# Patient Record
Sex: Male | Born: 1955 | Race: White | Hispanic: Yes | Marital: Married | Smoking: Never smoker
Health system: Southern US, Community
[De-identification: ages and names within clinical notes are randomized; demographics above are authoritative.]

## PROBLEM LIST (undated history)

## (undated) DIAGNOSIS — E785 Hyperlipidemia, unspecified: Secondary | ICD-10-CM

## (undated) DIAGNOSIS — M545 Low back pain, unspecified: Secondary | ICD-10-CM

## (undated) DIAGNOSIS — R0602 Shortness of breath: Secondary | ICD-10-CM

## (undated) DIAGNOSIS — G8929 Other chronic pain: Secondary | ICD-10-CM

## (undated) DIAGNOSIS — K219 Gastro-esophageal reflux disease without esophagitis: Secondary | ICD-10-CM

## (undated) DIAGNOSIS — I639 Cerebral infarction, unspecified: Secondary | ICD-10-CM

## (undated) DIAGNOSIS — I10 Essential (primary) hypertension: Secondary | ICD-10-CM

## (undated) DIAGNOSIS — E7849 Other hyperlipidemia: Secondary | ICD-10-CM

## (undated) HISTORY — DX: Hyperlipidemia, unspecified: E78.5

## (undated) HISTORY — DX: Other hyperlipidemia: E78.49

## (undated) HISTORY — DX: Essential (primary) hypertension: I10

## (undated) HISTORY — PX: KNEE ARTHROSCOPY: SHX127

---

## 2000-06-12 ENCOUNTER — Encounter: Payer: Self-pay | Admitting: Family Medicine

## 2000-06-12 ENCOUNTER — Encounter: Admission: RE | Admit: 2000-06-12 | Discharge: 2000-06-12 | Payer: Self-pay | Admitting: Family Medicine

## 2002-10-02 ENCOUNTER — Encounter: Payer: Self-pay | Admitting: Emergency Medicine

## 2002-10-02 ENCOUNTER — Emergency Department (HOSPITAL_COMMUNITY): Admission: EM | Admit: 2002-10-02 | Discharge: 2002-10-02 | Payer: Self-pay | Admitting: Emergency Medicine

## 2003-08-09 ENCOUNTER — Encounter: Admission: RE | Admit: 2003-08-09 | Discharge: 2003-08-09 | Payer: Self-pay | Admitting: Family Medicine

## 2003-09-22 ENCOUNTER — Encounter: Admission: RE | Admit: 2003-09-22 | Discharge: 2003-09-22 | Payer: Self-pay | Admitting: Family Medicine

## 2004-06-20 ENCOUNTER — Ambulatory Visit (HOSPITAL_COMMUNITY): Admission: RE | Admit: 2004-06-20 | Discharge: 2004-06-20 | Payer: Self-pay | Admitting: Gastroenterology

## 2004-09-15 ENCOUNTER — Inpatient Hospital Stay (HOSPITAL_COMMUNITY): Admission: EM | Admit: 2004-09-15 | Discharge: 2004-09-18 | Payer: Self-pay | Admitting: Emergency Medicine

## 2005-05-31 ENCOUNTER — Encounter: Admission: RE | Admit: 2005-05-31 | Discharge: 2005-08-29 | Payer: Self-pay | Admitting: Family Medicine

## 2012-03-16 ENCOUNTER — Other Ambulatory Visit: Payer: Self-pay | Admitting: Dermatology

## 2012-03-20 ENCOUNTER — Ambulatory Visit (INDEPENDENT_AMBULATORY_CARE_PROVIDER_SITE_OTHER): Payer: Managed Care, Other (non HMO) | Admitting: Family

## 2012-03-20 ENCOUNTER — Encounter: Payer: Self-pay | Admitting: Family

## 2012-03-20 ENCOUNTER — Telehealth: Payer: Self-pay

## 2012-03-20 ENCOUNTER — Ambulatory Visit (INDEPENDENT_AMBULATORY_CARE_PROVIDER_SITE_OTHER)
Admission: RE | Admit: 2012-03-20 | Discharge: 2012-03-20 | Disposition: A | Discharge status: Home / Self Care | Payer: Managed Care, Other (non HMO) | Source: Ambulatory Visit | Attending: Family | Admitting: Family

## 2012-03-20 VITALS — BP 122/82 | HR 87 | Temp 98.3°F | Ht 70.0 in | Wt 236.0 lb

## 2012-03-20 DIAGNOSIS — R079 Chest pain, unspecified: Secondary | ICD-10-CM

## 2012-03-20 DIAGNOSIS — K219 Gastro-esophageal reflux disease without esophagitis: Secondary | ICD-10-CM

## 2012-03-20 LAB — CBC WITH DIFFERENTIAL/PLATELET
Basophils Absolute: 0 10*3/uL (ref 0.0–0.1)
Basophils Relative: 0.3 % (ref 0.0–3.0)
Eosinophils Absolute: 0.4 10*3/uL (ref 0.0–0.7)
Eosinophils Relative: 5.6 % — ABNORMAL HIGH (ref 0.0–5.0)
HCT: 47.3 % (ref 39.0–52.0)
MCHC: 33.2 g/dL (ref 30.0–36.0)
MCV: 94 fl (ref 78.0–100.0)
Monocytes Relative: 10.2 % (ref 3.0–12.0)
Neutrophils Relative %: 48.1 % (ref 43.0–77.0)
Platelets: 221 10*3/uL (ref 150.0–400.0)
RBC: 5.03 Mil/uL (ref 4.22–5.81)
RDW: 13.8 % (ref 11.5–14.6)

## 2012-03-20 LAB — COMPREHENSIVE METABOLIC PANEL
Calcium: 9.2 mg/dL (ref 8.4–10.5)
Chloride: 102 mEq/L (ref 96–112)
Sodium: 138 mEq/L (ref 135–145)

## 2012-03-20 LAB — LIPID PANEL
Cholesterol: 165 mg/dL (ref 0–200)
Total CHOL/HDL Ratio: 3
Triglycerides: 75 mg/dL (ref 0.0–149.0)
VLDL: 15 mg/dL (ref 0.0–40.0)

## 2012-03-20 LAB — TSH: TSH: 1.21 u[IU]/mL (ref 0.35–5.50)

## 2012-03-20 MED ORDER — OMEPRAZOLE 40 MG PO CPDR: 40.0000 mg | DELAYED_RELEASE_CAPSULE | Freq: Every day | ORAL | Status: DC

## 2012-03-20 NOTE — Progress Notes (Signed)
Subjective:    Patient ID: Timothy Case, male    DOB: [birth year], 56 y.o.   MRN: [id number]  HPI 56 year old male, new patient to the practice is in to be established. He has a history of hyperlipidemia and is currently taking Lipitor. Patient has concerns today of pain in his left chest x1 week, described as mild, 1-2/10. Pain is worse with coughing or sneezing. Is not affected by movement. He's also noticed an increase in belching, burping, bloating and fullness. His wife gave him Nexium last night to have made much of a difference. Consumes about 4 cups of caffeine a day. 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 Not currently exercising. He eats out daily, but avoids fast food. Family history with his father is unknown. Mother died at age 25 unknown illness.    Review of Systems  Constitutional: Negative.   HENT: Negative.   Respiratory: Negative.  Negative for chest tightness.   Cardiovascular: Positive for chest pain. Negative for palpitations and leg swelling.  Gastrointestinal: Negative.   Genitourinary: Negative.   Musculoskeletal: Negative.   Skin: Negative.   Neurological: Negative.   Hematological: Negative.   Psychiatric/Behavioral: Negative.    Past Medical History  Diagnosis Date  . Hyperlipidemia   . Hypertension     History   Social History  . Marital Status: Married    Spouse Name: N/A    Number of Children: N/A  . Years of Education: N/A   Occupational History  . Not on file.   Social History Main Topics  . Smoking status: Never Smoker   . Smokeless tobacco: Not on file  . Alcohol Use: Yes     Comment: casual  . Drug Use: No  . Sexually Active: Not on file   Other Topics Concern  . Not on file   Social History Narrative  . No narrative on file    Past Surgical History  Procedure Date  . Knee surgery     No family history on file.  No Known Allergies  Current Outpatient  Prescriptions on File Prior to Visit  Medication Sig Dispense Refill  . atorvastatin (LIPITOR) 20 MG tablet Take 20 mg by mouth daily.       Marland Kitchen NASONEX 50 MCG/ACT nasal spray Place 2 sprays into the nose daily.       . ramipril (ALTACE) 5 MG capsule Take 5 mg by mouth daily.       Marland Kitchen zolpidem (AMBIEN) 10 MG tablet Take 10 mg by mouth at bedtime as needed.         BP 122/82  Pulse 87  Temp 98.3 F (36.8 C) (Oral)  Ht 5\' 10"  (1.778 m)  Wt 236 lb (107.049 kg)  BMI 33.86 kg/m2  SpO2 93%chart    Objective:   Physical Exam  Constitutional: He is oriented to person, place, and time. He appears well-developed and well-nourished.  HENT:  Right Ear: External ear normal.  Left Ear: External ear normal.  Nose: Nose normal.  Mouth/Throat: Oropharynx is clear and moist.  Eyes: Pupils are equal, round, and reactive to light.  Neck: Normal range of motion. Neck supple. No thyromegaly present.  Cardiovascular: Normal rate, regular rhythm and normal heart sounds.   Pulmonary/Chest: Effort normal and breath sounds normal.  Abdominal: Soft. Bowel sounds are normal.  Musculoskeletal: Normal range of motion.  Neurological: He is alert and oriented to person, place, and time. He has normal reflexes.  Skin: Skin is warm and dry.  Psychiatric: He has a normal mood and affect.          Assessment & Plan:  Assessment: Chest pain, GERD  Plan: Obtain chest x-ray, CMP, lipids, LFTs, CBC will notify patient pending results. We may consider referral to cardiology just for further clearance if his symptoms do not improve.

## 2012-03-20 NOTE — Telephone Encounter (Signed)
Message copied by Beverely Low on Fri Mar 20, 2012  3:00 PM ------      Message from: Adline Mango B      Created: Fri Mar 20, 2012 12:37 PM       Labs normal

## 2012-03-20 NOTE — Patient Instructions (Signed)
Chest Pain (Nonspecific) It is often hard to give a specific diagnosis for the cause of chest pain. There is always a chance that your pain could be related to something serious, such as a heart attack or a blood clot in the lungs. You need to follow up with your caregiver for further evaluation. CAUSES   Heartburn.  Pneumonia or bronchitis.  Anxiety or stress.  Inflammation around your heart (pericarditis) or lung (pleuritis or pleurisy).  A blood clot in the lung.  A collapsed lung (pneumothorax). It can develop suddenly on its own (spontaneous pneumothorax) or from injury (trauma) to the chest.  Shingles infection (herpes zoster virus). The chest wall is composed of bones, muscles, and cartilage. Any of these can be the source of the pain.  The bones can be bruised by injury.  The muscles or cartilage can be strained by coughing or overwork.  The cartilage can be affected by inflammation and become sore (costochondritis). DIAGNOSIS  Lab tests or other studies, such as X-rays, electrocardiography, stress testing, or cardiac imaging, may be needed to find the cause of your pain.  TREATMENT   Treatment depends on what may be causing your chest pain. Treatment may include:  Acid blockers for heartburn.  Anti-inflammatory medicine.  Pain medicine for inflammatory conditions.  Antibiotics if an infection is present.  You may be advised to change lifestyle habits. This includes stopping smoking and avoiding alcohol, caffeine, and chocolate.  You may be advised to keep your head raised (elevated) when sleeping. This reduces the chance of acid going backward from your stomach into your esophagus.  Most of the time, nonspecific chest pain will improve within 2 to 3 days with rest and mild pain medicine. HOME CARE INSTRUCTIONS   If antibiotics were prescribed, take your antibiotics as directed. Finish them even if you start to feel better.  For the next few days, avoid physical  activities that bring on chest pain. Continue physical activities as directed.  Do not smoke.  Avoid drinking alcohol.  Only take over-the-counter or prescription medicine for pain, discomfort, or fever as directed by your caregiver.  Follow your caregiver's suggestions for further testing if your chest pain does not go away.  Keep any follow-up appointments you made. If you do not go to an appointment, you could develop lasting (chronic) problems with pain. If there is any problem keeping an appointment, you must call to reschedule. SEEK MEDICAL CARE IF:   You think you are having problems from the medicine you are taking. Read your medicine instructions carefully.  Your chest pain does not go away, even after treatment.  You develop a rash with blisters on your chest. SEEK IMMEDIATE MEDICAL CARE IF:   You have increased chest pain or pain that spreads to your arm, neck, jaw, back, or abdomen.  You develop shortness of breath, an increasing cough, or you are coughing up blood.  You have severe back or abdominal pain, feel nauseous, or vomit.  You develop severe weakness, fainting, or chills.  You have a fever. THIS IS AN EMERGENCY. Do not wait to see if the pain will go away. Get medical help at once. Call your local emergency services (911 in [place] Do not drive yourself to the hospital. MAKE SURE YOU:   Understand these instructions.  Will watch your condition.  Will get help right away if you are not doing well or get worse. Document Released: 01/09/2005 Document Revised: 06/24/2011 Document Reviewed: 11/05/2007 ExitCare Patient Information 2013 ExitCare,   LLC.   Diet for Gastroesophageal Reflux Disease, Adult Reflux (acid reflux) is when acid from your stomach flows up into the esophagus. When acid comes in contact with the esophagus, the acid causes irritation and soreness (inflammation) in the esophagus. When reflux happens often or so severely that it causes damage  to the esophagus, it is called gastroesophageal reflux disease (GERD). Nutrition therapy can help ease the discomfort of GERD. FOODS OR DRINKS TO AVOID OR LIMIT  Smoking or chewing tobacco. Nicotine is one of the most potent stimulants to acid production in the gastrointestinal tract.  Caffeinated and decaffeinated coffee and black tea.  Regular or low-calorie carbonated beverages or energy drinks (caffeine-free carbonated beverages are allowed).   Strong spices, such as black pepper, white pepper, red pepper, cayenne, curry powder, and chili powder.  Peppermint or spearmint.  Chocolate.  High-fat foods, including meats and fried foods. Extra added fats including oils, butter, salad dressings, and nuts. Limit these to less than 8 tsp per day.  Fruits and vegetables if they are not tolerated, such as citrus fruits or tomatoes.  Alcohol.  Any food that seems to aggravate your condition. If you have questions regarding your diet, call your caregiver or a registered dietitian. OTHER THINGS THAT MAY HELP GERD INCLUDE:   Eating your meals slowly, in a relaxed setting.  Eating 5 to 6 small meals per day instead of 3 large meals.  Eliminating food for a period of time if it causes distress.  Not lying down until 3 hours after eating a meal.  Keeping the head of your bed raised 6 to 9 inches (15 to 23 cm) by using a foam wedge or blocks under the legs of the bed. Lying flat may make symptoms worse.  Being physically active. Weight loss may be helpful in reducing reflux in overweight or obese adults.  Wear loose fitting clothing EXAMPLE MEAL PLAN This meal plan is approximately 2,000 calories based on https://www.bernard.org/ meal planning guidelines. Breakfast   cup cooked oatmeal.  1 cup strawberries.  1 cup low-fat milk.  1 oz almonds. Snack  1 cup cucumber slices.  6 oz yogurt (made from low-fat or fat-free milk). Lunch  2 slice whole-wheat bread.  2 oz sliced  [place].  2 tsp mayonnaise.  1 cup blueberries.  1 cup snap peas. Snack  6 whole-wheat crackers.  1 oz string cheese. Dinner   cup brown rice.  1 cup mixed veggies.  1 tsp olive oil.  3 oz grilled fish. Document Released: 04/01/2005 Document Revised: 06/24/2011 Document Reviewed: 02/15/2011 [facility] Patient Information 2013 [place], [place].

## 2012-03-23 DIAGNOSIS — K219 Gastro-esophageal reflux disease without esophagitis: Secondary | ICD-10-CM

## 2012-03-23 DIAGNOSIS — R079 Chest pain, unspecified: Secondary | ICD-10-CM

## 2012-03-26 ENCOUNTER — Ambulatory Visit: Payer: Self-pay | Admitting: Physician Assistant

## 2012-04-21 ENCOUNTER — Emergency Department (HOSPITAL_COMMUNITY): Payer: Managed Care, Other (non HMO)

## 2012-04-21 ENCOUNTER — Encounter (HOSPITAL_COMMUNITY): Payer: Self-pay | Admitting: Emergency Medicine

## 2012-04-21 ENCOUNTER — Telehealth: Payer: Self-pay | Admitting: Family

## 2012-04-21 ENCOUNTER — Observation Stay (HOSPITAL_COMMUNITY)
Admission: EM | Admit: 2012-04-21 | Discharge: 2012-04-22 | Disposition: A | Discharge status: Home / Self Care | Payer: Managed Care, Other (non HMO) | Attending: Family Medicine | Admitting: Family Medicine

## 2012-04-21 ENCOUNTER — Other Ambulatory Visit: Payer: Self-pay

## 2012-04-21 DIAGNOSIS — G459 Transient cerebral ischemic attack, unspecified: Secondary | ICD-10-CM

## 2012-04-21 DIAGNOSIS — Z7902 Long term (current) use of antithrombotics/antiplatelets: Secondary | ICD-10-CM | POA: Insufficient documentation

## 2012-04-21 DIAGNOSIS — K219 Gastro-esophageal reflux disease without esophagitis: Secondary | ICD-10-CM | POA: Diagnosis present

## 2012-04-21 DIAGNOSIS — I6350 Cerebral infarction due to unspecified occlusion or stenosis of unspecified cerebral artery: Secondary | ICD-10-CM | POA: Insufficient documentation

## 2012-04-21 DIAGNOSIS — R0602 Shortness of breath: Secondary | ICD-10-CM | POA: Insufficient documentation

## 2012-04-21 DIAGNOSIS — R079 Chest pain, unspecified: Principal | ICD-10-CM

## 2012-04-21 DIAGNOSIS — E669 Obesity, unspecified: Secondary | ICD-10-CM

## 2012-04-21 DIAGNOSIS — G9340 Encephalopathy, unspecified: Secondary | ICD-10-CM

## 2012-04-21 DIAGNOSIS — E785 Hyperlipidemia, unspecified: Secondary | ICD-10-CM

## 2012-04-21 DIAGNOSIS — Z79899 Other long term (current) drug therapy: Secondary | ICD-10-CM | POA: Insufficient documentation

## 2012-04-21 DIAGNOSIS — Z7982 Long term (current) use of aspirin: Secondary | ICD-10-CM | POA: Insufficient documentation

## 2012-04-21 DIAGNOSIS — R410 Disorientation, unspecified: Secondary | ICD-10-CM

## 2012-04-21 DIAGNOSIS — I639 Cerebral infarction, unspecified: Secondary | ICD-10-CM

## 2012-04-21 DIAGNOSIS — R404 Transient alteration of awareness: Secondary | ICD-10-CM

## 2012-04-21 DIAGNOSIS — I10 Essential (primary) hypertension: Secondary | ICD-10-CM

## 2012-04-21 HISTORY — DX: Cerebral infarction, unspecified: I63.9

## 2012-04-21 HISTORY — DX: Gastro-esophageal reflux disease without esophagitis: K21.9

## 2012-04-21 HISTORY — DX: Low back pain, unspecified: M54.50

## 2012-04-21 HISTORY — DX: Shortness of breath: R06.02

## 2012-04-21 HISTORY — DX: Low back pain: M54.5

## 2012-04-21 HISTORY — DX: Other chronic pain: G89.29

## 2012-04-21 LAB — CBC
HCT: 45.7 % (ref 39.0–52.0)
Hemoglobin: 15.8 g/dL (ref 13.0–17.0)
MCH: 31.9 pg (ref 26.0–34.0)
MCHC: 34.6 g/dL (ref 30.0–36.0)
RBC: 4.96 MIL/uL (ref 4.22–5.81)

## 2012-04-21 LAB — URINALYSIS, ROUTINE W REFLEX MICROSCOPIC
Bilirubin Urine: NEGATIVE
Hgb urine dipstick: NEGATIVE
Ketones, ur: NEGATIVE mg/dL
Leukocytes, UA: NEGATIVE
Nitrite: NEGATIVE
Protein, ur: NEGATIVE mg/dL
Specific Gravity, Urine: 1.024 (ref 1.005–1.030)
Urobilinogen, UA: 0.2 mg/dL (ref 0.0–1.0)
pH: 7 (ref 5.0–8.0)

## 2012-04-21 LAB — CBC WITH DIFFERENTIAL/PLATELET
Basophils Relative: 0 % (ref 0–1)
Lymphs Abs: 2.3 10*3/uL (ref 0.7–4.0)
MCH: 31.5 pg (ref 26.0–34.0)
MCHC: 34.4 g/dL (ref 30.0–36.0)
MCV: 91.6 fL (ref 78.0–100.0)
Monocytes Absolute: 0.6 10*3/uL (ref 0.1–1.0)
Monocytes Relative: 9 % (ref 3–12)
Neutro Abs: 3.6 10*3/uL (ref 1.7–7.7)
Platelets: 221 10*3/uL (ref 150–400)
RDW: 13.6 % (ref 11.5–15.5)

## 2012-04-21 LAB — CREATININE, SERUM: GFR calc non Af Amer: >90 mL/min (ref >90)

## 2012-04-21 LAB — POCT I-STAT TROPONIN I: Troponin i, poc: 0 ng/mL (ref 0.00–0.08)

## 2012-04-21 LAB — BASIC METABOLIC PANEL
BUN: 21 mg/dL (ref 6–23)
Chloride: 100 mEq/L (ref 96–112)
Potassium: 4.4 mEq/L (ref 3.5–5.1)

## 2012-04-21 LAB — GLUCOSE, CAPILLARY: Glucose-Capillary: 120 mg/dL — ABNORMAL HIGH (ref 70–99)

## 2012-04-21 LAB — RAPID URINE DRUG SCREEN, HOSP PERFORMED
Amphetamines: NOT DETECTED
Opiates: NOT DETECTED

## 2012-04-21 MED ORDER — ACETAMINOPHEN 325 MG PO TABS
650.0000 mg | ORAL_TABLET | Freq: Four times a day (QID) | ORAL | Status: DC | PRN
  Administered 2012-04-21 – 2012-04-22 (×2): 650 mg via ORAL
  Filled 2012-04-21: qty 2

## 2012-04-21 MED ORDER — SODIUM CHLORIDE 0.9 % IJ SOLN
3.0000 mL | Freq: Two times a day (BID) | INTRAMUSCULAR | Status: DC
  Administered 2012-04-21 – 2012-04-22 (×2): 3 mL via INTRAVENOUS

## 2012-04-21 MED ORDER — SODIUM CHLORIDE 0.9 % IJ SOLN: 3.0000 mL | INTRAMUSCULAR | Status: DC | PRN

## 2012-04-21 MED ORDER — ASPIRIN 325 MG PO TABS
325.0000 mg | ORAL_TABLET | Freq: Once | ORAL | Status: AC
  Administered 2012-04-21: 325 mg via ORAL
  Filled 2012-04-21: qty 1

## 2012-04-21 MED ORDER — ONDANSETRON HCL 4 MG/2ML IJ SOLN
4.0000 mg | Freq: Once | INTRAMUSCULAR | Status: AC
Start: 2012-04-21 — End: 2012-04-21
  Administered 2012-04-21: 4 mg via INTRAVENOUS

## 2012-04-21 MED ORDER — ONDANSETRON HCL 4 MG/2ML IJ SOLN
INTRAMUSCULAR | Status: AC
  Administered 2012-04-21: 4 mg via INTRAVENOUS
  Filled 2012-04-21: qty 2

## 2012-04-21 MED ORDER — FLUTICASONE PROPIONATE 50 MCG/ACT NA SUSP
1.0000 | Freq: Every day | NASAL | Status: DC
  Administered 2012-04-21 – 2012-04-22 (×2): 1 via NASAL
  Filled 2012-04-21: qty 16

## 2012-04-21 MED ORDER — SODIUM CHLORIDE 0.9 % IV SOLN: 250.0000 mL | INTRAVENOUS | Status: DC | PRN

## 2012-04-21 MED ORDER — PANTOPRAZOLE SODIUM 40 MG PO TBEC
80.0000 mg | DELAYED_RELEASE_TABLET | Freq: Every day | ORAL | Status: DC
  Administered 2012-04-21 – 2012-04-22 (×2): 80 mg via ORAL
  Filled 2012-04-21 (×2): qty 1
  Filled 2012-04-21: qty 2

## 2012-04-21 MED ORDER — ENOXAPARIN SODIUM 40 MG/0.4ML ~~LOC~~ SOLN
40.0000 mg | SUBCUTANEOUS | Status: DC
  Administered 2012-04-21: 40 mg via SUBCUTANEOUS
  Filled 2012-04-21 (×2): qty 0.4

## 2012-04-21 MED ORDER — RAMIPRIL 5 MG PO CAPS
5.0000 mg | ORAL_CAPSULE | Freq: Every day | ORAL | Status: DC
  Administered 2012-04-21: 5 mg via ORAL
  Filled 2012-04-21 (×2): qty 1

## 2012-04-21 MED ORDER — ATORVASTATIN CALCIUM 20 MG PO TABS
20.0000 mg | ORAL_TABLET | Freq: Every day | ORAL | Status: DC
  Administered 2012-04-21 – 2012-04-22 (×2): 20 mg via ORAL
  Filled 2012-04-21 (×2): qty 1

## 2012-04-21 MED ORDER — ASPIRIN 325 MG PO TABS
325.0000 mg | ORAL_TABLET | Freq: Every day | ORAL | Status: DC
  Administered 2012-04-21 – 2012-04-22 (×2): 325 mg via ORAL
  Filled 2012-04-21 (×3): qty 1

## 2012-04-21 MED ORDER — ZOLPIDEM TARTRATE 5 MG PO TABS
10.0000 mg | ORAL_TABLET | Freq: Every evening | ORAL | Status: DC | PRN
  Administered 2012-04-21: 10 mg via ORAL
  Filled 2012-04-21: qty 2

## 2012-04-21 NOTE — ED Notes (Signed)
MD at bedside. 

## 2012-04-21 NOTE — ED Provider Notes (Signed)
History     CSN: [id number]  Arrival date & time 04/21/12  1478   First MD Initiated Contact with Patient 04/21/12 [phone]      Chief Complaint  Patient presents with  . Chest Pain  . Altered Mental Status     Patient is a 57 y.o. male presenting with altered mental status. The history is provided by the patient and the spouse. The history is limited by the condition of the patient.  Altered Mental Status This is a new problem. Episode onset: unknown time ago. The problem occurs constantly. The problem has not changed since onset.Associated symptoms include chest pain and shortness of breath. Pertinent negatives include no abdominal pain and no headaches. Nothing aggravates the symptoms. Nothing relieves the symptoms. He has tried nothing for the symptoms. The treatment provided no relief.  pt presents from home for confusion Per wife, he was last seen last night prior to going to bed watching football and at his baseline He woke up and reported shortness of breath this morning.  He also appeared confused - wife reports that he had repetitive questioning and was not aware of the date.  He also endorsed CP.  She noted to focal weakness.  No slurred speech.  He has no h/o CVA.  No h/o drug use.  He had otherwise been at his baseline   Past Medical History  Diagnosis Date  . Hyperlipidemia   . Hypertension     Past Surgical History  Procedure Date  . Knee surgery     History reviewed. No pertinent family history.  History  Substance Use Topics  . Smoking status: Never Smoker   . Smokeless tobacco: Not on file  . Alcohol Use: Yes     Comment: casual      Review of Systems  Unable to perform ROS: Mental status change  Respiratory: Positive for shortness of breath.   Cardiovascular: Positive for chest pain.  Gastrointestinal: Negative for abdominal pain.  Neurological: Negative for headaches.  Psychiatric/Behavioral: Positive for altered mental status.    Allergies    Review of patient's allergies indicates no known allergies.  Home Medications   Current Outpatient Rx  Name  Route  Sig  Dispense  Refill  . ASPIRIN 81 MG PO TABS   Oral   Take 81 mg by mouth daily.         . ATORVASTATIN CALCIUM 20 MG PO TABS   Oral   Take 20 mg by mouth daily.          Marland Kitchen NASONEX 50 MCG/ACT NA SUSP   Nasal   Place 2 sprays into the nose daily.          Marland Kitchen OMEPRAZOLE 40 MG PO CPDR   Oral   Take 1 capsule (40 mg total) by mouth daily.   30 capsule   3   . RAMIPRIL 5 MG PO CAPS   Oral   Take 5 mg by mouth daily.          Marland Kitchen ZOLPIDEM TARTRATE 10 MG PO TABS   Oral   Take 10 mg by mouth at bedtime as needed.           BP 142/87  Pulse 78  Temp 98.6 F (37 C) (Oral)  Resp 17  SpO2 97%  Physical Exam CONSTITUTIONAL: Well developed/well nourished HEAD AND FACE: Normocephalic/atraumatic EYES: EOMI/PERRL, no nystagmus ENMT: Mucous membranes moist NECK: supple no meningeal signs, no bruits SPINE:entire spine nontender CV: S1/S2 noted, no  murmurs/rubs/gallops noted LUNGS: Lungs are clear to auscultation bilaterally, no apparent distress ABDOMEN: soft, nontender, no rebound or guarding GU:no cva tenderness NEURO:Awake/alert, facies symmetric, no arm or leg drift is noted Cranial nerves 3/4/5/6/10/21/08/11/12 tested and intact No past pointing He is oriented to person, he can recognize his wife and recall his birthdate.  However he can not recall current date or exact location EXTREMITIES: pulses normal, full ROM SKIN: warm, color normal PSYCH: no abnormalities of mood noted   ED Course  Procedures    Labs Reviewed  BASIC METABOLIC PANEL  CBC WITH DIFFERENTIAL   tPA in stroke considered but not given due to:  Onset over 3-4.5hours  11:29 AM Pt with onset of confusion, but unclear time of onset.  He has no focal motor deficits.  His speech is clear but he is still confused.  His CT head is abnormal.  Will admit for further workup.   For his CP - he is in no distress.  EKG without ischemic findings and troponin negative.  I doubt dissection/PE at this time 12:11 PM D/w dr Joette Catching to admit to tele Pt stable, family updated  MDM  Nursing notes including past medical history and social history reviewed and considered in documentation Labs/vital reviewed and considered xrays reviewed and considered        Date: 04/21/2012  Rate: 83  Rhythm: normal sinus rhythm  QRS Axis: normal  Intervals: normal  ST/T Wave abnormalities: nonspecific ST changes  Conduction Disutrbances:none     Joya Gaskins, MD 04/21/12 1211

## 2012-04-21 NOTE — ED Notes (Signed)
Patient transported to CT 

## 2012-04-21 NOTE — Telephone Encounter (Signed)
Pt's wife states that pt was disoriented and asked the same questions over and over again and that he continued to complain about the pain in his chest. Advised wife to call 911 immediately to get pt to the hospital. She verbalized understanding.

## 2012-04-21 NOTE — ED Notes (Signed)
Pt was seen at [facility] PMD r/t CP 2 weeks ago and diagnosed with acid reflux.

## 2012-04-21 NOTE — ED Notes (Signed)
Pt stated he was nausea during phlebotomy stick. ED MD made aware and verbal order received

## 2012-04-21 NOTE — ED Notes (Addendum)
Pt here c/o left sided CP and SOB and woke up this am with repetitive questions and confusion; per wife pt normally works every day and is independent; pt alert to person and place only; pt with clear speech and mae; pt c/o SOB and CP only; pt LSN last night

## 2012-04-21 NOTE — H&P (Signed)
[facility] History and Physical  Timothy Case XWR:[id number] DOB: 1956/01/31 DOA: 04/21/2012  Referring physician: Maurine Case, ER physician PCP: Timothy Quiet, FNP  Specialists: None  Chief Complaint: Confusion  HPI: Timothy Case is a 57 y.o. male  Past medical history of mild obesity, hypertension and hyperlipidemia who was in his usual state of health and clear mentation last night. 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 When she would answer him, he would then repeat the same question 30 seconds later. He kept repeating questions over and over and appeared to be somewhat confused. He was driven by his wife to the emergency room.  In the emergency room, blood work, urine drug screen and vitals are unremarkable. Patient did not know his own birth date, the current date or other basic questions. He was not agitated. A CT scan of the head noted a questionableVery subtle focus of low attenuation posterior limb right internal capsule possibly representing chronic small vessel ischemic progressive change versus subacute lacunar infarct. Hospitalists were called for further evaluation and admission. After an hour or so in the emergency room, patient's mentation returned to normal. The time he arrived to the floor, his wife confirmed that he was back to his baseline. The patient self states he has no recollection of the events of this morning.   Review of Systems: I saw the patient on the floor, he was doing okay. He denied any headaches, vision changes, dysphasia, chest pain, palpitations, shortness of breath, wheeze, cough, abdominal pain, hematuria, dysuria, constipation, diarrhea, focal extremity numbness or weakness or pain. He has no complaints. His only noteworthy issue is in the last few weeks he's had some issues  of this pain right below his left breast sometimes catches. He was evaluated for this at his primary care doctor told it was GERD. Medication resolved it  Past Medical History  Diagnosis Date  . Hyperlipidemia   . Hypertension    Past Surgical History  Procedure Date  . Knee surgery    Social History:  reports that he has never smoked. He does not have any smokeless tobacco history on file. He reports that he drinks alcohol. He reports that he does not use illicit drugs. Patient lives at home with his wife. He is normally able to participate in most activities of daily living without assistance  No Known Allergies  Family history: Patient states as far as he knows, his serum is overall pretty healthy. His sons are both healthy. His family emigrated abruptly from [place] and his mom died of old age.  Prior to Admission medications   Medication Sig Start Date End Date Taking? Authorizing Provider  atorvastatin (LIPITOR) 20 MG tablet Take 20 mg by mouth daily.    Yes Historical Provider, MD  NASONEX 50 MCG/ACT nasal spray Place 2 sprays into the nose daily.     Historical Provider, MD  omeprazole (PRILOSEC) 40 MG capsule Take 40 mg by mouth daily.    Timothy Pierini, FNP  ramipril (ALTACE) 5 MG capsule Take 5 mg by mouth daily.     Historical Provider, MD  zolpidem (AMBIEN) 10 MG tablet Take 10 mg by mouth at bedtime as needed. For sleep 12/20/11   Historical Provider, MD   Physical Exam: Filed Vitals:   04/21/12 1230 04/21/12 1300 04/21/12 1406 04/21/12 1615  BP:  128/83 138/98  Pulse: 71 77 71 76  Temp:   97.8 F (36.6 C) 97.5 F (36.4 C)  TempSrc:   Oral Oral  Resp: 13 18 16 18   Height:   5\' 10"  (1.778 m)   Weight:   106.6 kg (235 lb 0.2 oz)   SpO2: 96% 96% 97% 98%     General:  Alert and oriented x3, no acute distress  Eyes: Sclera nonicteric, extraocular movements are intact  ENT: Normocephalic, atraumatic, mucous members are moist  Neck: Neck is slightly thick, no  JVD, no carotid bruits  Cardiovascular: Regular rate and rhythm, S1-S2  Respiratory: Clear to auscultation bilaterally  Abdomen: Soft, obese, nontender, positive bowel sounds  Skin: No skin breaks, tears or lesions  Musculoskeletal: No clubbing or cyanosis or pitting edema  Psychiatric: Patient is appropriate no evidence of psychoses  Neurologic: No focal deficits. Normal finger to nose testing. Cranial nerves II through XII intact. Negative for Babinski sign. Flexion, extension and grip are intact for upper and lower 5/5 and symmetric  Labs on Admission:  Basic Metabolic Panel:  Lab 04/21/12 9811  NA 134*  K 4.4  CL 100  CO2 22  GLUCOSE 116*  BUN 21  CREATININE 0.81  CALCIUM 8.9  MG --  PHOS --   CBC:  Lab 04/21/12 0941  WBC 6.8  NEUTROABS 3.6  HGB 15.7  HCT 45.7  MCV 91.6  PLT 221   CBG:  Lab 04/21/12 0939  GLUCAP 120*    Radiological Exams on Admission: Ct Head Wo Contrast  04/21/2012   IMPRESSION: Very subtle focus of low attenuation posterior limb right internal capsule possibly representing chronic small vessel ischemic progressive change versus subacute lacunar infarct.  Otherwise negative study.   Original Report Authenticated By: Esperanza Heir, M.D.    Dg Chest Portable 1 View  04/21/2012   IMPRESSION:  1.  Mild aortic tortuosity.  No acute findings.   Original Report Authenticated By: Gaylyn Rong, M.D.     EKG: Independently reviewed. Normal sinus rhythm. (EKG reads as incomplete right bundle branch block, but this is inaccurate)  Assessment/Plan Principal Problem:  *Delirium: I have a high suspicion for TIA. Given signs of possible ischemia on CT plus no other pertinent positives, suspected he did indeed have a TIA which cause some delirium. We'll plan to check MRI/MRA, echocardiogram and carotid Dopplers. We'll also check A1 C. If workup is negative, would recommend he increase from 81 mg to 325 mg. In addition, he is currently on Lipitor 20  mg in the recommend he increase to 40 mg daily. If workup is complete, could likely go home tomorrow. Active Problems:  HTN (hypertension): Based on blood pressure records from [facility], blood pressure is well controlled  Obesity: Counseled on weight loss. Patient should probably lose about 45 pounds.  Wife also notes snoring. Patient has never had a sleep study and will recommend he get one as an outpatient  Hyperlipidemia: We will repeat fasting lipid profile as patient just had this done on 03/20/12. Studies note an LDL of 90 with an HDL of 60 and total cholesterol 165.  GERD (gastroesophageal reflux disease): Continue when necessary PPI    Code Status: Full code  Family Communication: Plan discussed with patient and his wife who is at the bedside.  Disposition Plan:  If workup is negative, discharge home tomorrow  Time spent: 30 minutes  Hollice Espy [facility] Pager [phone]  If 7PM-7AM, please contact night-coverage  [email or web address] Password [facility] 04/21/2012, 6:06 PM

## 2012-04-21 NOTE — Telephone Encounter (Signed)
Patient's spouse called stating that her husband is very confused not aware of his surroundings and acting strange. Triage was called and I was on hold for five minutes. Call was given to [facility] as triage was unreachable.

## 2012-04-22 ENCOUNTER — Observation Stay (HOSPITAL_COMMUNITY): Payer: Managed Care, Other (non HMO)

## 2012-04-22 DIAGNOSIS — G459 Transient cerebral ischemic attack, unspecified: Secondary | ICD-10-CM

## 2012-04-22 DIAGNOSIS — G9340 Encephalopathy, unspecified: Secondary | ICD-10-CM

## 2012-04-22 LAB — HEMOGLOBIN A1C: Mean Plasma Glucose: 123 mg/dL — ABNORMAL HIGH (ref <117)

## 2012-04-22 MED ORDER — CLOPIDOGREL BISULFATE 75 MG PO TABS: 75.0000 mg | ORAL_TABLET | Freq: Every day | ORAL | Status: DC

## 2012-04-22 MED ORDER — HYDROCODONE-ACETAMINOPHEN 5-325 MG PO TABS
1.0000 | ORAL_TABLET | Freq: Four times a day (QID) | ORAL | Status: DC | PRN
  Administered 2012-04-22: 1 via ORAL
  Filled 2012-04-22: qty 1

## 2012-04-22 NOTE — Progress Notes (Signed)
  Echocardiogram 2D Echocardiogram has been performed.  Ellender Hose A 04/22/2012, 1:42 PM

## 2012-04-22 NOTE — Progress Notes (Signed)
Occupational Therapy Note  OT order received and appreciated. Per PT report, pt has returned to baseline and has no acute OT needs. Will sign off at this time. Thanks.  04/22/2012 Cipriano Mile OTR/L Pager [phone] Office [phone]

## 2012-04-22 NOTE — Progress Notes (Signed)
*  PRELIMINARY RESULTS* Vascular Ultrasound Carotid Duplex (Doppler) has been completed.  Preliminary findings: Bilateral:  No evidence of hemodynamically significant internal carotid artery stenosis.   Vertebral artery flow is antegrade.      Farrel Demark, RDMS, RVT 04/22/2012, 1:16 PM

## 2012-04-22 NOTE — Discharge Summary (Signed)
Physician Discharge Summary  Timothy Case ZOX:[id number] DOB: 07/03/1955 DOA: 04/21/2012  PCP: Janell Quiet, FNP  Admit date: 04/21/2012 Discharge date: 04/22/2012  Recommendations for Outpatient Follow-up:  1. Followup suspected TIA, risk factor reduction  Follow-up Information    Follow up with CAMPBELL, PADONDA BOYD, FNP. In 2 weeks.   Contact information:   [address] are @ [place] [address] [phone]       Follow up with Gates Rigg, MD. Schedule an appointment as soon as possible for a visit in 2 months.   Contact information:   [address], [address] [facility] [place] [address] [phone]         Discharge Diagnoses:  1. Acute encephalopathy, delirium 2. Suspected TIA 3. Chest pain, shortness of breath 4. GERD 5. Hypertension 6. Hyperlipidemia 7. Obesity  Discharge Condition: Improved Disposition: Home  Diet recommendation: Heart healthy  Filed Weights   04/21/12 1406  Weight: 106.6 kg (235 lb 0.2 oz)    History of present illness:  57 year old man with history of hypertension and hyperlipidemia presented with confusion. Basic laboratory studies were unremarkable. Neurologic exam was reported to be nonfocal. EKG was unremarkable. CT head was concerning for stroke.  Hospital Course:  Mr. Gabay was admitted for further evaluation to exclude stroke. MRI was negative for stroke, MRA was unremarkable. Echocardiogram and carotid Dopplers were unremarkable. He had no recurrence of symptoms. Wife confirms that only symptoms were confusion. Urinalysis was negative and there is no history to suggest infection. The patient is amnestic raising the possibility of some kind of amnestic phenomena but at this point will treat presumptively for suspected TIA. Long discussion with him and his wife in regard to secondary risk factor reduction, weight loss, exercise and the importance of followup  with his primary care physician and with the neurologist. Admitted on aspirin discharged on Plavix. Continue Lipitor.  1. Acute encephalopathy, delirium: Resolved without recurrence. Suspected TIA. MRI brain and MRA head negative. Carotid ultrasound 2-D echocardiogram unremarkable Was on aspirin at admission. Discharge and Plavix. 2. Suspected TIA: As above 3. Atypical Chest pain, shortness of breath: Resolved, without recurrence. Patient recently evaluated in the outpatient setting for this and was treated with PPI which relieved symptoms. He feels that this is secondary to GERD, it is relieved with belching. Troponin negative. EKG unremarkable. No further evaluation suggested. Echocardiogram reassuring. Of note patient has also had extensive cardiac workup at [facility] where he has a yearly exam. 4. GERD: Continue PPI.  5. Hypertension: Stable. Resume ACE inhibitor on discharge.  6. Hyperlipidemia: Continue Lipitor. LDL of 90 with an HDL of 60 and total cholesterol 165  7. Obesity: Consider outpatient sleep study.  Consultants:  Speech therapy: No need for formal evaluation.   physical therapy: No needs  Occupational therapy: no needs  Procedures:  2-D echocardiogram: Left ventricle: The cavity size was normal. There was moderate focal basal hypertrophy of the septum. Systolic function was normal. Although no diagnostic regional wall motion abnormality was identified, this possibility cannot be completely excluded on the basis of this study. - Right ventricle: The cavity size was mildly dilated. Wall thickness was normal.  Carotid Dopplers: Preliminary findings: Bilateral: No evidence of hemodynamically significant internal carotid artery stenosis. Vertebral artery flow is antegrade.   Discharge Instructions  Discharge Orders    Future Orders Please Complete By Expires   Diet - low sodium heart healthy      Discharge instructions  Comments:   He was started on  a medication for presumed transient ischemic attack. This medication is called Plavix and take the place of aspirin. He should continue Lipitor. Followup with your primary care provider for continued management of hypertension, hyperlipidemia and risk factor reduction. Call your physician or seek immediate medical attention for confusion or signs of stroke.   Activity as tolerated - No restrictions          Medication List     As of 04/22/2012  5:42 PM    STOP taking these medications         aspirin 81 MG tablet      TAKE these medications         atorvastatin 20 MG tablet   Commonly known as: LIPITOR   Take 20 mg by mouth daily.      clopidogrel 75 MG tablet   Commonly known as: PLAVIX   Take 1 tablet (75 mg total) by mouth daily with breakfast.      NASONEX 50 MCG/ACT nasal spray   Generic drug: mometasone   Place 2 sprays into the nose daily.      omeprazole 40 MG capsule   Commonly known as: PRILOSEC   Take 40 mg by mouth daily.      ramipril 5 MG capsule   Commonly known as: ALTACE   Take 5 mg by mouth daily.      zolpidem 10 MG tablet   Commonly known as: AMBIEN   Take 10 mg by mouth at bedtime as needed. For sleep         The results of significant diagnostics from this hospitalization (including imaging, microbiology, ancillary and laboratory) are listed below for reference.    Significant Diagnostic Studies: Ct Head Wo Contrast  04/21/2012  *RADIOLOGY REPORT*  Clinical Data: Mental status changes including confusion, nausea  CT HEAD WITHOUT CONTRAST  Technique:  Contiguous axial images were obtained from the base of the skull through the vertex without contrast.  Comparison: 09/15/2004  Findings: There is mild age related atrophy.  There is no evidence of intracranial mass.  There is no hemorrhage or extra-axial fluid. There is no hydrocephalus.  There is a very subtle sub centimeter focus of low attenuation in the posterior limb of the internal capsule on the  right.  This may represent progressive mild chronic small vessel ischemic change or possibly a subacute lacunar infarct.  It is new from 2006.  IMPRESSION: Very subtle focus of low attenuation posterior limb right internal capsule possibly representing chronic small vessel ischemic progressive change versus subacute lacunar infarct.  Otherwise negative study.   Original Report Authenticated By: Esperanza Heir, M.D.    Mri Brain Without Contrast  04/22/2012  *RADIOLOGY REPORT*  Clinical Data:  Episode of confusion.  Hypertensive hyperlipidemic patient.  MRI BRAIN WITHOUT CONTRAST MRA HEAD WITHOUT CONTRAST  Technique: Multiplanar, multiecho pulse sequences of the brain and surrounding structures were obtained according to standard protocol without intravenous contrast.  Angiographic images of the head were obtained using MRA technique without contrast.  Comparison: 04/21/2012 CT.  No comparison MR.  MRI HEAD  Findings:  Motion degraded exam.  No acute infarct.  No intracranial hemorrhage.  No intracranial mass lesion detected on this unenhanced exam.  No hydrocephalus.  Cerebellar tonsils minimally low-lying but within the range of normal limits.  Minimal to mild paranasal sinus mucosal thickening.  IMPRESSION: No acute infarct.  Please see above.  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  Anterior circulation without medium or large size vessel significant stenosis or occlusion.  Fetal type origin right posterior cerebral artery.  Evaluation of the vertebral arteries, PICAs, AICAs and proximal vertebral artery is limited by motion artifact.  No high-grade stenosis of the distal vertebral arteries or proximal basilar artery.  Mild irregularity of the superior cerebellar artery bilaterally.  No obvious aneurysm or vascular malformation.  IMPRESSION: Exam is motion degraded limiting evaluation of the vertebral arteries, PICAs,  AICAs and proximal basilar artery.  Anterior circulation  without medium or large size vessel significant stenosis or occlusion.   Original Report Authenticated By: Lacy Duverney, M.D.    Dg Chest Portable 1 View  04/21/2012  *RADIOLOGY REPORT*  Clinical Data: Chest pain.  Altered mental status.  PORTABLE CHEST - 1 VIEW  Comparison: 03/20/2012  Findings: Heart size within normal limits for technique.  Mild tortuosity of the thoracic aorta is observed.  Mildly lordotic projection noted.  The lungs appear clear.  IMPRESSION:  1.  Mild aortic tortuosity.  No acute findings.   Original Report Authenticated By: Gaylyn Rong, M.D.         Labs: Basic Metabolic Panel:  Lab 04/21/12 1610 04/21/12 0941  NA -- 134*  K -- 4.4  CL -- 100  CO2 -- 22  GLUCOSE -- 116*  BUN -- 21  CREATININE 0.90 0.81  CALCIUM -- 8.9  MG -- --  PHOS -- --   CBC:  Lab 04/21/12 1822 04/21/12 0941  WBC 8.7 6.8  NEUTROABS -- 3.6  HGB 15.8 15.7  HCT 45.7 45.7  MCV 92.1 91.6  PLT 221 221   CBG:  Lab 04/21/12 0939  GLUCAP 120*    Principal Problem:  *Delirium Active Problems:  HTN (hypertension)  Obesity  Hyperlipidemia  GERD (gastroesophageal reflux disease)  Acute encephalopathy  TIA (transient ischemic attack)   Time coordinating discharge: 35 minutes  Signed:  Brendia Sacks, MD [facility] 04/22/2012, 5:42 PM

## 2012-04-22 NOTE — Progress Notes (Signed)
Off unit for scheduled tests via wheelchair in no acute distress

## 2012-04-22 NOTE — Evaluation (Addendum)
Physical Therapy Evaluation Patient Details Name: Timothy Case MRN: [id number] DOB: 1955-07-03 Today's Date: 04/22/2012 Time: 1478-2956 PT Time Calculation (min): 25 min  PT Assessment / Plan / Recommendation Clinical Impression  Pt is a 57 yo male with suspected TIA presenting with some short term memory deficits of day of incident but otherwise is functioning at baseline in safe and independent manner. Patient safe to d/c home with spouse.Patient with no further acute skilled PT needs. PT signing off on patient. Please reconsult if needed in future.    PT Assessment  Patent does not need any further PT services    Follow Up Recommendations  No PT follow up    Does the patient have the potential to tolerate intense rehabilitation      Barriers to Discharge        Equipment Recommendations       Recommendations for Other Services     Frequency      Precautions / Restrictions Precautions Precautions: None Restrictions Weight Bearing Restrictions: No   Pertinent Vitals/Pain Denies pain or numbness tingling       Mobility  Bed Mobility Bed Mobility: Supine to Sit;Sit to Supine Supine to Sit: 7: Independent;HOB flat Sit to Supine: 7: Independent;HOB flat Details for Bed Mobility Assistance: safe Transfers Transfers: Sit to Stand;Stand to Sit Sit to Stand: 7: Independent Stand to Sit: 7: Independent Details for Transfer Assistance: safe Ambulation/Gait Ambulation/Gait Assistance: 7: Independent Ambulation Distance (Feet): 250 Feet Assistive device: None Ambulation/Gait Assistance Details: WFL, no deviations or deficiits Gait Pattern: Within Functional Limits Gait velocity: wfl General Gait Details: normal Stairs: Yes Stairs Assistance: 6: Modified independent (Device/Increase time) Stair Management Technique: One rail Left Number of Stairs: 12  Modified Rankin (Stroke Patients Only) Pre-Morbid Rankin Score: No symptoms Modified Rankin: No symptoms      Shoulder Instructions     Exercises     PT Diagnosis:    PT Problem List:   PT Treatment Interventions:     PT Goals Acute Rehab PT Goals PT Goal Formulation:  (n/a)  Visit Information  Last PT Received On: 04/22/12 Assistance Needed: +1    Subjective Data  Subjective: Pt received supine in bed with report "I feel fine again." Patient Stated Goal: home   Prior Functioning  Home Living Lives With: Spouse Available Help at Discharge: Family;Available 24 hours/day Type of Home: House Home Access: Stairs to enter [employer] of Steps: 3 Entrance Stairs-Rails: Can reach both Home Layout: Multi-level;Able to live on main level with bedroom/bathroom Alternate Level Stairs-Number of Steps: 12 Alternate Level Stairs-Rails: Can reach both Bathroom Shower/Tub: Health visitor: Standard Prior Function Level of Independence: Independent Able to Take Stairs?: Yes Driving: Yes Vocation: Full time employment Comments: president of [employer] Communication Communication: No difficulties Dominant Hand: Right    Cognition  Overall Cognitive Status: Appears within functional limits for tasks assessed/performed Arousal/Alertness: awake/alert Orientation Level: Oriented X4 / Intact;Appears intact for tasks assessed. Pt with no memory of day of incident. Behavior During Session: [facility] for tasks performed    Extremity/Trunk Assessment Right Upper Extremity Assessment RUE ROM/Strength/Tone: Within functional levels RUE Sensation: WFL - Light Touch RUE Coordination: WFL - gross/fine motor Left Upper Extremity Assessment LUE ROM/Strength/Tone: Within functional levels LUE Sensation: WFL - Light Touch Right Lower Extremity Assessment RLE ROM/Strength/Tone: Within functional levels RLE Sensation: WFL - Light Touch RLE Coordination: WFL - gross/fine motor Left Lower Extremity Assessment LLE ROM/Strength/Tone: Within functional levels LLE Sensation: WFL - Light  Touch  LLE Coordination: WFL - gross/fine motor Trunk Assessment Trunk Assessment: Normal   Balance    End of Session PT - End of Session Activity Tolerance: Patient tolerated treatment well Patient left: in bed;with call bell/phone within reach;with family/visitor present Nurse Communication: Mobility status  GP Functional Assessment Tool Used: clinical judgment Functional Limitation: Mobility: Walking and moving around Mobility: Walking and Moving Around Current Status ([id number]): At least 1 percent but less than 20 percent impaired, limited or restricted Mobility: Walking and Moving Around Goal Status [phone]): 0 percent impaired, limited or restricted   Marcene Brawn 04/22/2012, 4:39 PM  Lewis Shock, PT, DPT Pager #: [phone] Office #: [phone]

## 2012-04-22 NOTE — Progress Notes (Addendum)
[facility] PROGRESS NOTE  Timothy Case RUE:[id number] DOB: 1955-09-19 DOA: 04/21/2012 PCP: Janell Quiet, FNP  Assessment/Plan: 1. Acute encephalopathy, delirium: Resolved without recurrence. Suspected TIA. MRI brain negative. Complete TIA evaluation. Consider increasing aspirin to 325 mg daily. 2. Chest pain, shortness of breath: Resolved, without recurrence. Patient recently evaluated in the outpatient setting for this and was treated with PPI which relieved symptoms. He feels that this is secondary to GERD, it is relieved with belching. Troponin negative. EKG unremarkable. No further evaluation suggested. 3. GERD: Continue PPI. 4. Hypertension: Stable. Resume ACE inhibitor on discharge. 5. Hyperlipidemia: Continue Lipitor. LDL of 90 with an HDL of 60 and total cholesterol 165 6. Obesity: Consider outpatient sleep study.  Code Status: Full code Family Communication: None present Disposition Plan: Home on workup complete  Brendia Sacks, MD  [facility] Team 4  Pager [phone] If 7PM-7AM, please contact night-coverage at [email or web address], password [facility] 04/22/2012, 7:58 AM  LOS: 1 day   Brief narrative: 57 year old man with history of hypertension and hyperlipidemia presented with confusion. Basic laboratory studies were unremarkable. Neurologic exam was reported to be nonfocal. EKG was unremarkable. CT head was concerning for stroke.  Consultants:  Speech therapy: No need for formal evaluation.  Procedures:  2-D echocardiogram: Left ventricle: The cavity size was normal. There was moderate focal basal hypertrophy of the septum. Systolic function was normal. Although no diagnostic regional wall motion abnormality was identified, this possibility cannot be completely excluded on the basis of this study. - Right ventricle: The cavity size was mildly dilated. Wall thickness was normal.  Carotid Dopplers: Preliminary findings: Bilateral: No evidence of  hemodynamically significant internal carotid artery stenosis. Vertebral artery flow is antegrade.   HPI/Subjective: No problems overnight. No confusion. No focal neurologic deficits.  Objective: Filed Vitals:   04/21/12 2000 04/21/12 2215 04/22/12 0005 04/22/12 0715  BP: 117/85 128/86 130/88 144/95  Pulse: 69 71 68 74  Temp: 97.8 F (36.6 C) 97.7 F (36.5 C)  98.5 F (36.9 C)  TempSrc: Oral Oral  Oral  Resp: 20 22 20 20   Height:      Weight:      SpO2: 95% 97% 97% 98%   No intake or output data in the 24 hours ending 04/22/12 0758 Filed Weights   04/21/12 1406  Weight: 106.6 kg (235 lb 0.2 oz)    Exam:  General:  Appears calm and comfortable Eyes: PERRL, normal lids, irises Cardiovascular: RRR, no m/r/g. No LE edema. Telemetry: SR, no arrhythmias  Respiratory: CTA bilaterally, no w/r/r. Normal respiratory effort. Musculoskeletal: grossly normal tone and strength BUE/BLE, sensation grossly intact all extremities. Psychiatric: grossly normal mood and affect, speech fluent and appropriate Neurologic: Cranial nerves 2-12 intact. No dysdiadochokinesis.  Data Reviewed: Basic Metabolic Panel:  Lab 04/21/12 6213 04/21/12 0941  NA -- 134*  K -- 4.4  CL -- 100  CO2 -- 22  GLUCOSE -- 116*  BUN -- 21  CREATININE 0.90 0.81  CALCIUM -- 8.9  MG -- --  PHOS -- --   CBC:  Lab 04/21/12 1822 04/21/12 0941  WBC 8.7 6.8  NEUTROABS -- 3.6  HGB 15.8 15.7  HCT 45.7 45.7  MCV 92.1 91.6  PLT 221 221   CBG:  Lab 04/21/12 0939  GLUCAP 120*    Studies: Ct Head Wo Contrast  04/21/2012  *RADIOLOGY REPORT*  Clinical Data: Mental status changes including confusion, nausea  CT HEAD WITHOUT CONTRAST  Technique:  Contiguous axial images were obtained from the base  of the skull through the vertex without contrast.  Comparison: 09/15/2004  Findings: There is mild age related atrophy.  There is no evidence of intracranial mass.  There is no hemorrhage or extra-axial fluid. There is no  hydrocephalus.  There is a very subtle sub centimeter focus of low attenuation in the posterior limb of the internal capsule on the right.  This may represent progressive mild chronic small vessel ischemic change or possibly a subacute lacunar infarct.  It is new from 2006.  IMPRESSION: Very subtle focus of low attenuation posterior limb right internal capsule possibly representing chronic small vessel ischemic progressive change versus subacute lacunar infarct.  Otherwise negative study.   Original Report Authenticated By: Esperanza Heir, M.D.    Mri Brain Without Contrast  04/22/2012  *RADIOLOGY REPORT*  Clinical Data:  Episode of confusion.  Hypertensive hyperlipidemic patient.  MRI BRAIN WITHOUT CONTRAST MRA HEAD WITHOUT CONTRAST  Technique: Multiplanar, multiecho pulse sequences of the brain and surrounding structures were obtained according to standard protocol without intravenous contrast.  Angiographic images of the head were obtained using MRA technique without contrast.  Comparison: 04/21/2012 CT.  No comparison MR.  MRI HEAD  Findings:  Motion degraded exam.  No acute infarct.  No intracranial hemorrhage.  No intracranial mass lesion detected on this unenhanced exam.  No hydrocephalus.  Cerebellar tonsils minimally low-lying but within the range of normal limits.  Minimal to mild paranasal sinus mucosal thickening.  IMPRESSION: No acute infarct.  Please see above.  MRA HEAD  Findings: Aplastic A1 segment right anterior cerebral artery.  Anterior circulation without medium or large size vessel significant stenosis or occlusion.  Fetal type origin right posterior cerebral artery.  Evaluation of the vertebral arteries, PICAs, AICAs and proximal vertebral artery is limited by motion artifact.  No high-grade stenosis of the distal vertebral arteries or proximal basilar artery.  Mild irregularity of the superior cerebellar artery bilaterally.  No obvious aneurysm or vascular malformation.  IMPRESSION: Exam is  motion degraded limiting evaluation of the vertebral arteries, PICAs,  AICAs and proximal basilar artery.  Anterior circulation without medium or large size vessel significant stenosis or occlusion.   Original Report Authenticated By: Lacy Duverney, M.D.    Dg Chest Portable 1 View  04/21/2012  *RADIOLOGY REPORT*  Clinical Data: Chest pain.  Altered mental status.  PORTABLE CHEST - 1 VIEW  Comparison: 03/20/2012  Findings: Heart size within normal limits for technique.  Mild tortuosity of the thoracic aorta is observed.  Mildly lordotic projection noted.  The lungs appear clear.  IMPRESSION:  1.  Mild aortic tortuosity.  No acute findings.   Original Report Authenticated By: Gaylyn Rong, M.D.    Mr Mra Head/brain Wo Cm  04/22/2012  *RADIOLOGY REPORT*  Clinical Data:  Episode of confusion.  Hypertensive hyperlipidemic patient.  MRI BRAIN WITHOUT CONTRAST MRA HEAD WITHOUT CONTRAST  Technique: Multiplanar, multiecho pulse sequences of the brain and surrounding structures were obtained according to standard protocol without intravenous contrast.  Angiographic images of the head were obtained using MRA technique without contrast.  Comparison: 04/21/2012 CT.  No comparison MR.  MRI HEAD  Findings:  Motion degraded exam.  No acute infarct.  No intracranial hemorrhage.  No intracranial mass lesion detected on this unenhanced exam.  No hydrocephalus.  Cerebellar tonsils minimally low-lying but within the range of normal limits.  Minimal to mild paranasal sinus mucosal thickening.  IMPRESSION: No acute infarct.  Please see above.  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  Anterior circulation without medium or large size vessel significant stenosis or occlusion.  Fetal type origin right posterior cerebral artery.  Evaluation of the vertebral arteries, PICAs, AICAs and proximal vertebral artery is limited by motion artifact.  No high-grade stenosis of the distal vertebral arteries or  proximal basilar artery.  Mild irregularity of the superior cerebellar artery bilaterally.  No obvious aneurysm or vascular malformation.  IMPRESSION: Exam is motion degraded limiting evaluation of the vertebral arteries, PICAs,  AICAs and proximal basilar artery.  Anterior circulation without medium or large size vessel significant stenosis or occlusion.   Original Report Authenticated By: Lacy Duverney, M.D.     Scheduled Meds:   . aspirin  325 mg Oral Daily  . atorvastatin  20 mg Oral Daily  . enoxaparin (LOVENOX) injection  40 mg Subcutaneous Q24H  . fluticasone  1 spray Each Nare Daily  . pantoprazole  80 mg Oral Daily  . ramipril  5 mg Oral Daily  . sodium chloride  3 mL Intravenous Q12H   Continuous Infusions:   Principal Problem:  *Delirium Active Problems:  HTN (hypertension)  Obesity  Hyperlipidemia  GERD (gastroesophageal reflux disease)  Acute encephalopathy  TIA (transient ischemic attack)     Brendia Sacks, MD  [facility] Team 4  Pager [phone] If 7PM-7AM, please contact night-coverage at [email or web address], password [facility] 04/22/2012, 7:58 AM  LOS: 1 day

## 2012-04-22 NOTE — Progress Notes (Signed)
SLP Cancellation Note  Patient Details Name: Timothy Case MRN: [id number] DOB: [birth year]   Cancelled evaluation:   Pt's cognitive deficits have resolved - back to baseline.  No formalized assessment warranted.  SLP to sign-off.        Blenda Mounts Laurice 04/22/2012, 12:11 PM

## 2012-04-23 NOTE — Care Management Note (Signed)
    Page 1 of 1   04/23/2012     8:13:05 AM   CARE MANAGEMENT NOTE 04/23/2012  Patient:  Timothy Case, Timothy Case   Account Number:  [id number]  Date Initiated:  04/22/2012  Documentation initiated by:  Jacquelynn Cree  Subjective/Objective Assessment:   Admitted with AMS, TIA workup     Action/Plan:   PT/OT evals-no follow up needs, no equipment needs   Anticipated DC Date:  04/22/2012   Anticipated DC Plan:  HOME/SELF CARE      DC Planning Services  CM consult      Choice offered to / List presented to:             Status of service:  Completed, signed off Medicare Important Message given?   (If response is "NO", the following Medicare IM given date fields will be blank) Date Medicare IM given:   Date Additional Medicare IM given:    Discharge Disposition:  HOME/SELF CARE  Per UR Regulation:  Reviewed for med. necessity/level of care/duration of stay  If discussed at Long Length of Stay Meetings, dates discussed:    Comments:

## 2012-05-15 ENCOUNTER — Other Ambulatory Visit: Payer: Self-pay | Admitting: Family

## 2012-05-15 ENCOUNTER — Encounter: Payer: Self-pay | Admitting: Family

## 2012-05-15 ENCOUNTER — Ambulatory Visit (INDEPENDENT_AMBULATORY_CARE_PROVIDER_SITE_OTHER): Payer: Managed Care, Other (non HMO) | Admitting: Family

## 2012-05-15 VITALS — BP 110/70 | HR 60 | Temp 98.4°F | Wt 234.0 lb

## 2012-05-15 DIAGNOSIS — E7800 Pure hypercholesterolemia, unspecified: Secondary | ICD-10-CM

## 2012-05-15 DIAGNOSIS — R0683 Snoring: Secondary | ICD-10-CM

## 2012-05-15 DIAGNOSIS — E669 Obesity, unspecified: Secondary | ICD-10-CM

## 2012-05-15 DIAGNOSIS — G459 Transient cerebral ischemic attack, unspecified: Secondary | ICD-10-CM

## 2012-05-15 DIAGNOSIS — R0609 Other forms of dyspnea: Secondary | ICD-10-CM

## 2012-05-15 DIAGNOSIS — R0989 Other specified symptoms and signs involving the circulatory and respiratory systems: Secondary | ICD-10-CM

## 2012-05-15 MED ORDER — FLUTICASONE PROPIONATE 50 MCG/ACT NA SUSP: 2.0000 | Freq: Every day | NASAL | Status: DC

## 2012-05-15 NOTE — Patient Instructions (Signed)
Transient Ischemic Attack  A transient ischemic attack (TIA) is a "warning stroke" that causes stroke-like symptoms. Unlike a stroke, a TIA does not cause permanent damage to the brain. The symptoms of a TIA can happen very fast and do not last long. It is important to know the symptoms of a TIA and what to do. This can help prevent a major stroke or death.  CAUSES    A TIA is caused by a temporary blockage in an artery in the brain or neck (carotid artery). The blockage does not allow the brain to get the blood supply it needs and can cause different symptoms. The blockage can be caused by either:   A blood clot.   Fatty buildup (plaque) in a neck or brain artery.  SYMPTOMS   TIA symptoms are the same as a stroke but are temporary. Symptoms can include sudden:   Numbness or weakness on one side of the body. Especially to the:   Face.   Arm.   Leg.   Trouble speaking, thinking, or confusion.   Change in vision, such as trouble seeing in one or both eyes.   Dizziness, loss of balance, or difficulty walking.   Severe headache.  ANY OF THESE SYMPTOMS MAY REPRESENT A SERIOUS PROBLEM THAT IS AN EMERGENCY. Do not wait to see if the symptoms will go away. Get medical help at once. Call your local emergency services (911 in [place] IMMEDIATELY. DO NOT drive yourself to the hospital.  RISK FACTORS  Risk factors can increase the risk of developing a TIA. These can include.    High blood pressure (hypertension).   High cholesterol (hyperlipidemia).   Heart disease (atherosclerosis).   Smoking.   Diabetes.   Abnormal heart rhythm (atrial fibrillation).   Family history of a stroke or heart attack.   Use of oral contraceptives (especially when combined with smoking).  DIAGNOSIS    A TIA can be diagnosed based on your:   Symptoms.   History.   Risk factors.   Tests that can help diagnose the symptoms of a TIA include:   CT or MRI scan. These tests can provide detailed images of the brain.   Carotid  ultrasound. This test looks to see if there are blockages in the carotid arteries of your neck.   Arteriography. A thin, small flexible tube (catheter) is inserted through a small cut (incision) in your groin. The catheter is threaded to your carotid or vertebral artery. A dye is then injected into the catheter. The dye highlights the arteries in your brain and allows your caregiver to look for narrowing or blockages that can cause a TIA.  TREATMENT   Based on the cause of a TIA, treatment options can vary. Treatment is important to help prevent a stroke. Treatment options can include:   Medication. Such as:   Clot-busting medicine.   Anti-platelet medicine.   Blood pressure medicine.   Blood thinner medicine.   Surgery:   Carotid endarterectomy. The carotid arteries are the arteries that supply the head and neck with oxygenated blood. This surgery can help remove fatty deposits (plaque) in the carotid arteries.   Angioplasty and stenting. This surgery uses a balloon to dilate a blocked artery in the brain. A stent is a small, metal mesh tube that can help keep an artery open  HOME CARE INSTRUCTIONS    It is important to take all medicine as told by your caregiver. If the medicine has side effects that affect you negatively,   tell your caregiver right away. Do not stop taking medicine unless told by your caregiver. Some medicines may need to be changed to better treat your condition.   Do not smoke. Talk to your caregiver on how to quit smoking.   Eat a diet high in fruits, vegetables and lean meat. Avoid a high fat, high salt diet. A dietician can you help you make healthy food choices.   Maintain a healthy weight. Develop an exercise plan approved by your caregiver.  SEEK IMMEDIATE MEDICAL CARE IF:    You develop weakness or numbness on one side of your body.   You have problems thinking, speaking, or feel confused.   You have vision changes.   You feel dizzy, have trouble walking, or lose your  balance.   You develop a severe headache.  MAKE SURE YOU:    Understand these instructions.   Will watch your condition.   Will get help right away if you are not doing well or get worse.  Document Released: 01/09/2005 Document Revised: 06/24/2011 Document Reviewed: 05/25/2009  ExitCare Patient Information 2013 [employer].

## 2012-05-15 NOTE — Progress Notes (Signed)
Subjective:    Patient ID: Timothy Case, male    DOB: 06-13-1955, 57 y.o.   MRN: [id number]  HPI 57 year old male, nonsmoker, obese, is in for a follow-up post-hospital is in after having a TIA. He presented to the ED with confusion. Several hours after being there, he returned to normal state of mind. Patient does not remember the episode in its entirety. However, his wife is here providing history. He continues to have difficulty with concentrating and memory. Does not have any weakness, blurred vision, double vision, headaches. He had carotid Dopplers done that 10%. Ejection fraction 70%. Is refusing to take Plavix bc he says its causing him memory  Trouble, more fatigues and constipation.   Wife has concerns of snoring that is worsening. He c/o fatigue and daytime drowsiness that has been ongoing for several years. But has worsened since the TIA. Patient believes that he has nasal congestion that makes his snoring worse. Has been using Affrin for several weeks. Has Nasonex but hasn't used it much because of the expense. Denies any itch, watery eyes.    Review of Systems  Constitutional: Positive for fatigue.  HENT: Negative.   Eyes: Negative.   Respiratory: Negative.   Cardiovascular: Negative.   Gastrointestinal: Positive for constipation. Negative for abdominal pain and abdominal distention.  Musculoskeletal: Negative.   Skin: Negative.   Hematological: Negative.   Psychiatric/Behavioral: Positive for confusion and sleep disturbance.   Past Medical History  Diagnosis Date  . Hyperlipidemia   . Hypertension   . Shortness of breath     "lying down; sometimes" (04/21/2012)  . GERD (gastroesophageal reflux disease)   . Stroke 04/21/2012    "they are leaning towards mini stroke today; all S/S still gone now except little chest pain" (04/21/2012)  . Chronic lower back pain     "q am; cause I'm overweight" (04/21/2012)    History   Social History  . Marital Status: Married     Spouse Name: N/A    Number of Children: N/A  . Years of Education: N/A   Occupational History  . Not on file.   Social History Main Topics  . Smoking status: Never Smoker   . Smokeless tobacco: Never Used  . Alcohol Use: 3.6 oz/week    6 Glasses of wine per week     Comment: 04/21/2012 "few glasses of wine a few days in a row; none in a few days; @ the most I'll have is 2 glass of wine/day; average ~ 6 glasses/wk"  . Drug Use: No  . Sexually Active: Yes   Other Topics Concern  . Not on file   Social History Narrative  . No narrative on file    Past Surgical History  Procedure Date  . Knee arthroscopy ~ 2008    "both knees; ~ 6 months apart" (04/21/2012)    No family history on file.  No Known Allergies  Current Outpatient Prescriptions on File Prior to Visit  Medication Sig Dispense Refill  . atorvastatin (LIPITOR) 20 MG tablet Take 20 mg by mouth daily.       Marland Kitchen omeprazole (PRILOSEC) 40 MG capsule Take 40 mg by mouth daily.      . ramipril (ALTACE) 5 MG capsule Take 5 mg by mouth daily.       Marland Kitchen zolpidem (AMBIEN) 10 MG tablet Take 10 mg by mouth at bedtime as needed. For sleep      . clopidogrel (PLAVIX) 75 MG tablet Take 1 tablet (75  mg total) by mouth daily with breakfast.  30 tablet  0  . fluticasone (FLONASE) 50 MCG/ACT nasal spray Place 2 sprays into the nose daily.  16 g  6    BP 110/70  Pulse 60  Temp 98.4 F (36.9 C) (Oral)  Wt 234 lb (106.142 kg)  SpO2 98%chart    Objective:   Physical Exam  Constitutional: He is oriented to person, place, and time. He appears well-developed and well-nourished.  HENT:  Right Ear: External ear normal.  Left Ear: External ear normal.  Nose: Nose normal.  Mouth/Throat: Oropharynx is clear and moist.  Neck: Normal range of motion. Neck supple.  Cardiovascular: Normal rate, regular rhythm and normal heart sounds.   Pulmonary/Chest: Effort normal and breath sounds normal.  Abdominal: Soft. Bowel sounds are normal.   Musculoskeletal: Normal range of motion.  Neurological: He is alert and oriented to person, place, and time. He has normal reflexes. He displays normal reflexes. No cranial nerve deficit. Coordination normal.  Skin: Skin is warm and dry.  Psychiatric: He has a normal mood and affect.          Assessment & Plan:  Assessment:  1. Follow-Up TIA 2. Hypercholesterolemia 3. Snoring Disorder   Plan: Since patient refuses Plavix, start ASA 325mg  once a day. Refer to Neurology. Flonase 2 sprays in each nostril once a day. Continue all other medications. Refer for at-home sleep study.  Return sooner as needed.

## 2012-06-09 ENCOUNTER — Ambulatory Visit (HOSPITAL_BASED_OUTPATIENT_CLINIC_OR_DEPARTMENT_OTHER): Payer: Managed Care, Other (non HMO) | Attending: Family | Admitting: Radiology

## 2012-06-09 VITALS — Ht 70.0 in | Wt 230.0 lb

## 2012-06-09 DIAGNOSIS — R0683 Snoring: Secondary | ICD-10-CM

## 2012-06-09 DIAGNOSIS — E669 Obesity, unspecified: Secondary | ICD-10-CM

## 2012-06-09 DIAGNOSIS — G4733 Obstructive sleep apnea (adult) (pediatric): Secondary | ICD-10-CM

## 2012-06-09 NOTE — Patient Instructions (Signed)
The patient was instructed to sleep in lateral and supine positions.

## 2012-06-13 DIAGNOSIS — R0989 Other specified symptoms and signs involving the circulatory and respiratory systems: Secondary | ICD-10-CM

## 2012-06-13 DIAGNOSIS — R0609 Other forms of dyspnea: Secondary | ICD-10-CM

## 2012-06-13 DIAGNOSIS — G4733 Obstructive sleep apnea (adult) (pediatric): Secondary | ICD-10-CM

## 2012-06-13 NOTE — Procedures (Signed)
NAME:  Timothy Case, Timothy Case        ACCOUNT NO.:  [id number]  MEDICAL RECORD NO.:  [id number]          PATIENT TYPE:  OUT  LOCATION:  SLEEP CENTER                 FACILITY:  [facility]  PHYSICIAN:  Derwood Becraft D. Maple Hudson, MD, FCCP, FACPDATE OF BIRTH:  1955-05-17  DATE OF STUDY:  06/09/2012                           NOCTURNAL POLYSOMNOGRAM  REFERRING PHYSICIAN:  Padonda FNP Orvan Falconer  INDICATION FOR STUDY:  Hypersomnia with sleep apnea.  This is an unattended home sleep study.  The data file is stored in EPIC notes section.  EPWORTH SLEEPINESS SCORE:  3/24.  BMI 33, weight 230 pounds.  Height 5 feet 10 inches.  Neck 18.25 inches.  MEDICATIONS:  Home medications are charted and reviewed.  IMPRESSIONS: 1. Severe obstructive sleep apnea/hypopnea syndrome, AHI of 30.2 per     hour. 2. Snoring with oxygen desaturation to a nadir of 86%, with mean     oxygen saturation through the study of 94% on room air. 3. Regular cardiac rhythm with mean heart rate 59 per minute.  RECOMMENDATIONS:  Scores in this range are usually addressed first with CPAP.  The [facility] can arrange a dedicated CPAP titration study if desired.  Alternatives, including oral appliances or surgical evaluation, may be appropriate in some situations.     Demarius Archila D. Maple Hudson, MD, [facility], FACP Diplomate, American Board of Sleep Medicine    CDY/MEDQ  D:  06/13/2012 09:32:57  T:  06/13/2012 21:54:27  Job:  [id number]

## 2012-06-16 ENCOUNTER — Telehealth: Payer: Self-pay | Admitting: Family

## 2012-06-16 DIAGNOSIS — R0981 Nasal congestion: Secondary | ICD-10-CM

## 2012-06-16 NOTE — Telephone Encounter (Signed)
Patient called stating that he is returning the a call to the nurse. Please assist.

## 2012-06-16 NOTE — Telephone Encounter (Signed)
Would like referral to ENT for breathing issues. Dr. Ermalinda Barrios for nasal congestion.   Ok to refer, per [employer]

## 2012-06-17 ENCOUNTER — Telehealth: Payer: Self-pay | Admitting: Family

## 2012-06-17 NOTE — Telephone Encounter (Signed)
Received CPAP order, [facility] is the only agency who accepts [place].  Referral has been forwared to [place].

## 2012-06-18 ENCOUNTER — Telehealth: Payer: Self-pay

## 2012-06-18 NOTE — Telephone Encounter (Signed)
Spoke with pt to advise that [facility] cannot deliver the CPAP unless pt has the sleep study done at the sleep center because they would need the cmH2O to be able to set the CPAP correctly for the pt. Misty Stanley with [facility] also mentioned that an Rx can be faxed to their office for the pt to do a trial run with the CPAP. After speaking with the pt about this, he would like to do the trial to see if it works for him and if he will be able to tolerate it.  Spoke with Waynetta Sandy [facility]). She states that the pt can in fact do a trial with the CPAP for 2-4 weeks, at which time they will set his titration level at the range of which the provider requests. She says that the trail ranges are generally set between 4-20 cmH2O.  Rx sent to [facility] for pt to have the trial done. Fax number provided [phone]. [facility] # [phone]

## 2012-06-26 ENCOUNTER — Other Ambulatory Visit: Payer: Self-pay

## 2012-06-26 DIAGNOSIS — R0683 Snoring: Secondary | ICD-10-CM

## 2012-07-30 ENCOUNTER — Ambulatory Visit (HOSPITAL_BASED_OUTPATIENT_CLINIC_OR_DEPARTMENT_OTHER): Payer: Managed Care, Other (non HMO)

## 2012-08-03 ENCOUNTER — Ambulatory Visit: Payer: Self-pay | Admitting: Neurology

## 2012-08-05 ENCOUNTER — Encounter: Payer: Self-pay | Admitting: Neurology

## 2012-08-05 ENCOUNTER — Ambulatory Visit (INDEPENDENT_AMBULATORY_CARE_PROVIDER_SITE_OTHER): Payer: Managed Care, Other (non HMO) | Admitting: Neurology

## 2012-08-05 ENCOUNTER — Ambulatory Visit (HOSPITAL_BASED_OUTPATIENT_CLINIC_OR_DEPARTMENT_OTHER): Payer: Managed Care, Other (non HMO) | Attending: Otolaryngology | Admitting: Radiology

## 2012-08-05 VITALS — BP 138/87 | HR 78 | Temp 98.4°F | Ht 69.0 in | Wt 233.0 lb

## 2012-08-05 VITALS — Ht 69.0 in | Wt 225.0 lb

## 2012-08-05 DIAGNOSIS — R0683 Snoring: Secondary | ICD-10-CM

## 2012-08-05 DIAGNOSIS — G4730 Sleep apnea, unspecified: Secondary | ICD-10-CM | POA: Insufficient documentation

## 2012-08-05 DIAGNOSIS — G459 Transient cerebral ischemic attack, unspecified: Secondary | ICD-10-CM

## 2012-08-05 DIAGNOSIS — G40209 Localization-related (focal) (partial) symptomatic epilepsy and epileptic syndromes with complex partial seizures, not intractable, without status epilepticus: Secondary | ICD-10-CM

## 2012-08-05 DIAGNOSIS — R413 Other amnesia: Secondary | ICD-10-CM

## 2012-08-05 DIAGNOSIS — G4710 Hypersomnia, unspecified: Secondary | ICD-10-CM | POA: Insufficient documentation

## 2012-08-05 NOTE — Progress Notes (Signed)
[facility] [address] [place]. [place] [address] [phone]       OFFICE CONSULT NOTE  [name] Date of Birth:  27-Jun-1955 Medical Record Number:  [id number]   Referring MD:  Timothy Case, M.D.  Reason for Referral:  TIA  HPI: 57 year old latino male who had a transient episode of confusion, disorientation and short-term memory difficulties on 04/21/12. He is unable to record the most tedious which were obtained from his wife Timothy Case home I spoke over the phone. The patient apparently was confused and kept on asking the same question over and over again. He appeared to be spacey and not tuned  into his surroundings. He was able to call his wife and came to the hospital where initial CT head was unremarkable. He was admitted for overnight observation and subsequently underwent MRI scan of the brain which was also normal. MRA of brain was unremarkable Comprehensive metabolic panel labs, CBC were also normal. His symptoms resolved completely within a few hours and he has done well since discharge without significant memory problems. The patient states he was under significant stress from work but his had never had any neurological problems in the past. Denies prior history of seizures, significant head injury with loss of consciousness, stroke, TIA or migraine headaches. ROS:   14 system review of systems is positive for confusion, memory difficulty, snoring only PMH:  Past Medical History  Diagnosis Date  . Hyperlipidemia   . Hypertension   . Shortness of breath     "lying down; sometimes" (04/21/2012)  . GERD (gastroesophageal reflux disease)   . Stroke 04/21/2012    "they are leaning towards mini stroke today; all S/S still gone now except little chest pain" (04/21/2012)  . Chronic lower back pain     "q am; cause I'm overweight" (04/21/2012)  . Essential familial hyperlipidemia     Social History:  History   Social History  . Marital Status:  Married    Spouse Name: N/A    Number of Children: N/A  . Years of Education: N/A   Occupational History  . Not on file.   Social History Main Topics  . Smoking status: Never Smoker   . Smokeless tobacco: Never Used  . Alcohol Use: 3.6 oz/week    6 Glasses of wine per week     Comment: 04/21/2012 "few glasses of wine a few days in a row; none in a few days; @ the most I'll have is 2 glass of wine/day; average ~ 6 glasses/wk"  . Drug Use: No  . Sexually Active: Yes   Other Topics Concern  . Not on file   Social History Narrative  . No narrative on file    Medications:   Current Outpatient Prescriptions on File Prior to Visit  Medication Sig Dispense Refill  . atorvastatin (LIPITOR) 20 MG tablet Take 20 mg by mouth daily.       . fluticasone (FLONASE) 50 MCG/ACT nasal spray Place 2 sprays into the nose daily.  16 g  6  . omeprazole (PRILOSEC) 40 MG capsule Take 40 mg by mouth daily.      . ramipril (ALTACE) 5 MG capsule Take 5 mg by mouth daily.       Marland Kitchen zolpidem (AMBIEN) 10 MG tablet Take 10 mg by mouth at bedtime as needed. For sleep       No current facility-administered medications on file prior to visit.    Allergies:  No Known Allergies Filed Vitals:  08/05/12 1321  BP: 138/87  Pulse: 78  Temp: 98.4 F (36.9 C)   Active Ambulatory Problems    Diagnosis Date Noted  . HTN (hypertension) 04/21/2012  . Delirium 04/21/2012  . Obesity 04/21/2012  . Hyperlipidemia 04/21/2012  . GERD (gastroesophageal reflux disease) 04/21/2012  . Acute encephalopathy 04/22/2012  . TIA (transient ischemic attack) 04/22/2012   Resolved Ambulatory Problems    Diagnosis Date Noted  . No Resolved Ambulatory Problems   Past Medical History  Diagnosis Date  . Hypertension   . Shortness of breath   . Stroke 04/21/2012  . Chronic lower back pain   . Essential familial hyperlipidemia    Physical Exam General: well developed, well nourished latino male, seated, in no evident  distress Head: head normocephalic and atraumatic. Orohparynx benign Neck: supple with no carotid or supraclavicular bruits Cardiovascular: regular rate and rhythm, no murmurs Musculoskeletal: no deformity Skin:  no rash/petichiae Vascular:  Normal pulses all extremities  Neurologic Exam Mental Status: Awake and fully alert. Oriented to place and time. Recent and remote memory intact. Attention span, concentration and fund of knowledge appropriate. Mood and affect appropriate.  Cranial Nerves: Fundoscopic exam reveals sharp disc margins. Pupils equal, briskly reactive to light. Extraocular movements full without nystagmus. Visual fields full to confrontation. Hearing intact. Facial sensation intact. Face, tongue, palate moves normally and symmetrically.  Motor: Normal bulk and tone. Normal strength in all tested extremity muscles. Sensory.: intact to tough and pinprick and vibratory.  Coordination: Rapid alternating movements normal in all extremities. Finger-to-nose and heel-to-shin performed accurately bilaterally. Gait and Station: Arises from chair without difficulty. Stance is normal. Gait demonstrates normal stride length and balance . Able to heel, toe and tandem walk without difficulty.  Reflexes: 1+ and symmetric. Toes downgoing.     ASSESSMENT:  57 year old gentleman with transient episode of confusion and short-term memory difficulties in January 2014 possible transient global amnesia. TIA or complex partial seizure is less likely.   PLAN: Higher long discussion with the patient and his wife and answered questions. Check repeat MRI scan of brain with and without contrast as well as EEG. Continue aspirin and strict control of hypertension with blood pressure goal below 1:30/90 and lipids with LDL cholesterol goal below 100 mg percent. Continue planned polysomnogram for sleep apnea next week. Return for followup in 6 weeks or call earlier if necessary.

## 2012-08-05 NOTE — Patient Instructions (Signed)
He was advised to take aspirin 81 mg daily and maintain strict control of hypertension with blood pressure goal below 130/90 and lipids with LDL cholesterol goal below 100 mg percent. Check MRI scan of the brain with and without contrast to look for structural medial temporal lesion as well as EEG for seizure activity. Return for followup in 6 weeks or call earlier if necessary.

## 2012-08-06 ENCOUNTER — Ambulatory Visit: Payer: Self-pay | Admitting: Neurology

## 2012-08-07 ENCOUNTER — Other Ambulatory Visit (INDEPENDENT_AMBULATORY_CARE_PROVIDER_SITE_OTHER): Payer: Managed Care, Other (non HMO)

## 2012-08-07 DIAGNOSIS — G40209 Localization-related (focal) (partial) symptomatic epilepsy and epileptic syndromes with complex partial seizures, not intractable, without status epilepticus: Secondary | ICD-10-CM

## 2012-08-07 DIAGNOSIS — R413 Other amnesia: Secondary | ICD-10-CM

## 2012-08-07 DIAGNOSIS — G459 Transient cerebral ischemic attack, unspecified: Secondary | ICD-10-CM

## 2012-08-09 DIAGNOSIS — G4730 Sleep apnea, unspecified: Secondary | ICD-10-CM

## 2012-08-09 DIAGNOSIS — G4710 Hypersomnia, unspecified: Secondary | ICD-10-CM

## 2012-08-10 NOTE — Procedures (Signed)
NAME:  Timothy Case, REZNICK        ACCOUNT NO.:  [id number]  MEDICAL RECORD NO.:  [id number]          PATIENT TYPE:  OUT  LOCATION:  SLEEP CENTER                 FACILITY:  [facility]  PHYSICIAN:  Charlottie Peragine D. Maple Hudson, MD, FCCP, FACPDATE OF BIRTH:  06/12/1955  DATE OF STUDY:  08/05/2012                           NOCTURNAL POLYSOMNOGRAM  REFERRING PHYSICIAN:  Onalee Hua L. Annalee Genta, M.D.  INDICATION FOR STUDY:  Hypersomnia with sleep apnea.  EPWORTH SLEEPINESS SCORE:  2/24.  BMI 33.2, weight 225 pounds, height 69 inches, neck 17.5 inches.  MEDICATIONS:  Home medications are charted and reviewed.  SLEEP ARCHITECTURE:  Total sleep time 240 minutes with sleep efficiency 65.3%.  Stage I was 12.9%.  Stage II 68.8%.  Stage III absent.  REM 18.3% of total sleep time.  Sleep latency 11.5% on minutes, REM latency of 46.5 minutes, awake after sleep onset 116 minutes.  Arousal index 19.  Bedtime Medication:  None.  There were several intervals of spontaneous waking, lasting approximately 15 minutes in duration, throughout the night including 1 interval of approximately 45 minutes around 4 a.m.  RESPIRATORY DATA:  Apnea-hypopnea index (AHI) 3.8 per hour.  A total of 15 events were scored including 5 obstructive apneas, 3 central apneas, 7 hypopneas.  Events were associated with nonsupine sleep and with REM. REM AHI 16.4 per hour.  There were not enough events to qualify for split protocol CPAP titration.  OXYGEN DATA:  Snoring was moderate at times extremely loud with oxygen desaturation to a nadir of 92% and mean oxygen saturation through the study of 96.3% on room air.  CARDIAC DATA:  Sinus rhythm with PVCs and PACs.  MOVEMENT-PARASOMNIA:  A few incidental limb jerks were noted with no effect on sleep.  Bathroom x1.  IMPRESSIONS/RECOMMENDATION: 1. Sleep architecture was marked by fragmentation, with occasional     spontaneous waking throughout the night.  Intervals tended to last     about 15  minutes each, but were not associated with recognized     triggered events.  This might be treated as an insomnia. 2. Occasional respiratory event with sleep disturbance, within normal     limits.  AHI 3.8 per hour (the normal range     is from 0-5 events per hour).  Moderate to occasionally very loud     snoring with oxygen desaturation to a nadir of 92% and mean oxygen     saturation through the study of 96.3% on room air.     Phoenyx Melka D. Maple Hudson, MD, [facility], FACP Diplomate, American Board of Sleep Medicine    CDY/MEDQ  D:  08/09/2012 14:33:25  T:  08/10/2012 08:05:02  Job:  [id number]

## 2012-08-11 ENCOUNTER — Other Ambulatory Visit: Payer: Self-pay | Admitting: Neurology

## 2012-08-11 DIAGNOSIS — G40209 Localization-related (focal) (partial) symptomatic epilepsy and epileptic syndromes with complex partial seizures, not intractable, without status epilepticus: Secondary | ICD-10-CM

## 2012-08-19 ENCOUNTER — Ambulatory Visit (INDEPENDENT_AMBULATORY_CARE_PROVIDER_SITE_OTHER): Payer: Managed Care, Other (non HMO)

## 2012-08-19 DIAGNOSIS — G40209 Localization-related (focal) (partial) symptomatic epilepsy and epileptic syndromes with complex partial seizures, not intractable, without status epilepticus: Secondary | ICD-10-CM

## 2012-08-20 MED ORDER — GADOPENTETATE DIMEGLUMINE 469.01 MG/ML IV SOLN: 20.0000 mL | Freq: Once | INTRAVENOUS | Status: AC | PRN

## 2012-08-25 ENCOUNTER — Telehealth: Payer: Self-pay | Admitting: Neurology

## 2012-08-25 NOTE — Telephone Encounter (Signed)
Will ask Andrey Campanile to advise me on MRI results.

## 2012-08-26 ENCOUNTER — Telehealth: Payer: Self-pay | Admitting: *Deleted

## 2012-08-26 NOTE — Telephone Encounter (Signed)
Called patient and gave him normal results.RR

## 2012-08-26 NOTE — Telephone Encounter (Signed)
Message copied by Salome Spotted on Wed Aug 26, 2012  5:11 PM ------      Message from: [place], [place]      Created: Wed Aug 26, 2012  5:09 PM                   ----- Message -----         From: Micki Riley, MD         Sent: 08/24/2012  10:40 PM           To: Baker Pierini, FNP, [facility]            Normal brain MRI.let patient know ------

## 2012-09-08 ENCOUNTER — Other Ambulatory Visit: Payer: Self-pay

## 2013-03-18 ENCOUNTER — Other Ambulatory Visit: Payer: Self-pay | Admitting: Family Medicine

## 2013-03-18 DIAGNOSIS — R109 Unspecified abdominal pain: Secondary | ICD-10-CM

## 2013-03-18 DIAGNOSIS — Z87442 Personal history of urinary calculi: Secondary | ICD-10-CM

## 2013-03-18 DIAGNOSIS — R3129 Other microscopic hematuria: Secondary | ICD-10-CM

## 2013-03-22 ENCOUNTER — Ambulatory Visit
Admission: RE | Admit: 2013-03-22 | Discharge: 2013-03-22 | Disposition: A | Discharge status: Home / Self Care | Payer: Managed Care, Other (non HMO) | Source: Ambulatory Visit | Attending: Family Medicine | Admitting: Family Medicine

## 2013-03-22 DIAGNOSIS — R109 Unspecified abdominal pain: Secondary | ICD-10-CM

## 2013-03-22 DIAGNOSIS — Z87442 Personal history of urinary calculi: Secondary | ICD-10-CM

## 2013-03-22 DIAGNOSIS — R3129 Other microscopic hematuria: Secondary | ICD-10-CM

## 2013-11-13 ENCOUNTER — Encounter: Payer: Self-pay | Admitting: *Deleted

## 2014-07-25 ENCOUNTER — Other Ambulatory Visit: Payer: Self-pay | Admitting: Gastroenterology

## 2015-09-28 ENCOUNTER — Encounter: Payer: Self-pay | Admitting: Family Medicine

## 2015-09-28 ENCOUNTER — Ambulatory Visit (INDEPENDENT_AMBULATORY_CARE_PROVIDER_SITE_OTHER): Payer: Managed Care, Other (non HMO) | Admitting: Family Medicine

## 2015-09-28 VITALS — BP 128/80 | HR 88 | Temp 98.2°F | Resp 12 | Ht 69.0 in | Wt 219.5 lb

## 2015-09-28 DIAGNOSIS — L02419 Cutaneous abscess of limb, unspecified: Secondary | ICD-10-CM | POA: Diagnosis not present

## 2015-09-28 MED ORDER — DOXYCYCLINE HYCLATE 100 MG PO TABS: 100.0000 mg | ORAL_TABLET | Freq: Two times a day (BID) | ORAL | Status: AC

## 2015-09-28 NOTE — Progress Notes (Signed)
HPI:   Mr.Sebastien Goyne is a 60 y.o. male, who is here today complaining of tender, erythematous lesion on right axilla about 3-4 days ago, improved after he drained it yesterday.  In the past few months he has had recurrent episodes of similar lesions on both axillae (2 on right and one on left), resolved spontaneously after he drained lesions. He has not identified exacerbating factors.   He denies any new medication, detergent, soap, or body product. No known insect bite or outdoor exposures to plants. No sick contact.  He has not tried OTC. He  denies fever, chills, myalgias, oral lesions/edema,cough, wheezing, dyspnea, abdominal pain, nausea, or vomiting.    Review of Systems  Constitutional: Negative for fever, activity change, appetite change and fatigue.  HENT: Negative for facial swelling, mouth sores, nosebleeds and trouble swallowing.   Eyes: Negative for redness and visual disturbance.  Respiratory: Negative for cough, shortness of breath and wheezing.   Gastrointestinal: Negative for nausea, vomiting, abdominal pain and diarrhea.  Genitourinary: Negative for dysuria, hematuria and decreased urine volume.  Musculoskeletal: Negative for myalgias, arthralgias and neck pain.  Skin: Negative for wound.  Neurological: Negative for weakness, numbness and headaches.  Hematological: Negative for adenopathy.      Current Outpatient Prescriptions on File Prior to Visit  Medication Sig Dispense Refill  . atorvastatin (LIPITOR) 20 MG tablet Take 20 mg by mouth daily.     . ramipril (ALTACE) 5 MG capsule Take 10 mg by mouth daily.      No current facility-administered medications on file prior to visit.     Past Medical History  Diagnosis Date  . Hyperlipidemia   . Hypertension   . Shortness of breath     "lying down; sometimes" (04/21/2012)  . GERD (gastroesophageal reflux disease)   . Stroke (HCC) 04/21/2012    "they are leaning towards mini stroke  today; all S/S still gone now except little chest pain" (04/21/2012)  . Chronic lower back pain     "q am; cause I'm overweight" (04/21/2012)  . Essential familial hyperlipidemia    No Known Allergies  Social History   Social History  . Marital Status: Married    Spouse Name: N/A  . Number of Children: N/A  . Years of Education: N/A   Social History Main Topics  . Smoking status: Never Smoker   . Smokeless tobacco: Never Used  . Alcohol Use: 3.6 oz/week    6 Glasses of wine per week     Comment: 04/21/2012 "few glasses of wine a few days in a row; none in a few days; @ the most I'll have is 2 glass of wine/day; average ~ 6 glasses/wk"  . Drug Use: No  . Sexual Activity: Yes   Other Topics Concern  . None   Social History Narrative    Filed Vitals:   09/28/15 1420  BP: 128/80  Pulse: 88  Temp: 98.2 F (36.8 C)  Resp: 12   Body mass index is 32.4 kg/(m^2).       Physical Exam  Constitutional: He is oriented to person, place, and time. He appears well-developed. No distress.  HENT:  Head: Atraumatic.  Eyes: Conjunctivae are normal.  Neck: No muscular tenderness present. No tracheal deviation present.  Respiratory: Effort normal and breath sounds normal. No respiratory distress.  Musculoskeletal: He exhibits no edema.  Lymphadenopathy:    He has no cervical adenopathy.    He has no axillary adenopathy.  Neurological: He  is alert and oriented to person, place, and time. Gait normal.  Skin: Skin is warm. Rash noted. Rash is nodular. There is erythema.  Right axilla with a superficial 3 cm nodule lesion palpated, + erythema, no local heat, no fluctuant area, mildly tender. No active drainage.  Psychiatric: He has a normal mood and affect. His speech is normal.  Well groomed, good eye contact.      ASSESSMENT AND PLAN:     Regan RakersRoberto was seen today for lump under right arm.  Diagnoses and all orders for this visit:  Abscess of axillary region -     doxycycline  (VIBRA-TABS) 100 MG tablet; Take 1 tablet (100 mg total) by mouth 2 (two) times daily.  Improved after draining lesions, I do not think I&D is needed today. Possible causes discussed: folliculitis vs hydradenitis; I am thinking the former one is most likely the cause. Warm compresses. Oral Abx, some side effects discussed. Instructed about warning signs and f/u as needed.       -He was advised to return or notify a doctor immediately if symptoms worsen or persist or new concerns arise, he voices understanding.       Betty G. SwazilandJordan, MD  [facility]. [place] office.

## 2015-09-28 NOTE — Patient Instructions (Signed)
A few things to remember from today's visit:   1. Abscess of axillary region   Warm compresses. Monitor for warning signs. Follow up as needed.    - doxycycline (VIBRA-TABS) 100 MG tablet; Take 1 tablet (100 mg total) by mouth 2 (two) times daily.  Dispense: 20 tablet; Refill: 0     Timothy Case I have seen you today for an acute visit because your primary care provider was not available. Monitor for signs of worsening symptoms and seek immediate medical attention if any concerning/warning symptom as we discussed. If symptoms are not resolved in 1-2 weeks you should schedule a follow up appointment with your doctor, before if symptoms get worse.  Please continue following with your PCP for your other chronic medical problems and be sure you have an appointment already scheduled.    If you sign-up for My chart, you can communicate easier with us in case you have any question or concern.

## 2015-09-28 NOTE — Progress Notes (Signed)
Pre visit review using our clinic review tool, if applicable. No additional management support is needed unless otherwise documented below in the visit note. 

## 2016-03-09 ENCOUNTER — Ambulatory Visit (INDEPENDENT_AMBULATORY_CARE_PROVIDER_SITE_OTHER): Payer: Managed Care, Other (non HMO) | Admitting: Physician Assistant

## 2016-03-09 VITALS — BP 122/80 | HR 90 | Temp 98.6°F | Resp 16 | Ht 69.0 in | Wt 231.0 lb

## 2016-03-09 DIAGNOSIS — L03119 Cellulitis of unspecified part of limb: Secondary | ICD-10-CM | POA: Diagnosis not present

## 2016-03-09 DIAGNOSIS — L02419 Cutaneous abscess of limb, unspecified: Secondary | ICD-10-CM

## 2016-03-09 MED ORDER — DOXYCYCLINE HYCLATE 100 MG PO CAPS: 100.0000 mg | ORAL_CAPSULE | Freq: Two times a day (BID) | ORAL | 0 refills | Status: AC

## 2016-03-09 NOTE — Patient Instructions (Signed)
     IF you received an x-ray today, you will receive an invoice from [facility]. Please contact [facility] at [phone] with questions or concerns regarding your invoice.   IF you received labwork today, you will receive an invoice from [facility]. Please contact [facility] at [phone] with questions or concerns regarding your invoice.   Our billing staff will not be able to assist you with questions regarding bills from these companies.  You will be contacted with the lab results as soon as they are available. The fastest way to get your results is to activate your My Chart account. Instructions are located on the last page of this paperwork. If you have not heard from us regarding the results in 2 weeks, please contact this office.      

## 2016-03-09 NOTE — Progress Notes (Signed)
   03/09/2016 2:07 PM   DOB: [birth year] / MRN: [id number]  SUBJECTIVE:  Timothy Case is a 60 y.o. male presenting for a painful rash on the left anterior thigh that started roughly 4 days ago and is worsening.  He associates redness and exquisite pain.  He feels he is getting worse.  Denies constitutional symptoms at this time.   He has No Known Allergies.   He  has a past medical history of Chronic lower back pain; Essential familial hyperlipidemia; GERD (gastroesophageal reflux disease); Hyperlipidemia; Hypertension; Shortness of breath; and Stroke (HCC) (04/21/2012).    He  reports that he has never smoked. He has never used smokeless tobacco. He reports that he drinks about 3.6 oz of alcohol per week . He reports that he does not use drugs. He  reports that he currently engages in sexual activity. The patient  has a past surgical history that includes Knee arthroscopy (~ 2008).  His family history is not on file.  Review of Systems  Constitutional: Negative for chills and fever.  Gastrointestinal: Negative for nausea.  Musculoskeletal: Negative for myalgias.  Skin: Positive for rash. Negative for itching.  Neurological: Negative for dizziness.    The problem list and medications were reviewed and updated by myself where necessary and exist elsewhere in the encounter.   OBJECTIVE:  BP 122/80   Pulse 90   Temp 98.6 F (37 C) (Oral)   Resp 16   Ht 5\' 9"  (1.753 m)   Wt 231 lb (104.8 kg)   SpO2 98%   BMI 34.11 kg/m   Physical Exam  Constitutional: He is oriented to person, place, and time.  Cardiovascular: Normal rate.   Pulmonary/Chest: Effort normal.  Neurological: He is alert and oriented to person, place, and time.  Skin: Skin is warm.     Psychiatric: He has a normal mood and affect.   Risk and benefits discussed and verbal consent obtained. Anesthetic allergies reviewed. Patient anesthetized using 1:1 mix of 2% lidocaine with epi. A 1 cm incision was made  using a number 11 blade and purulent material was expressed.  The was wound packed. The patient tolerated the procedure without difficulty.   A clean dressing was placed.     No results found for this or any previous visit (from the past 72 hour(s)).  No results found.  ASSESSMENT AND PLAN  Timothy Case was seen today for abscess.  Diagnoses and all orders for this visit:  Cellulitis and abscess of leg: Drained.  He will come back and see PA Mani on Monday for recheck.  Advised he go to the ED if he has any problems until that time.  -     doxycycline (VIBRAMYCIN) 100 MG capsule; Take 1 capsule (100 mg total) by mouth 2 (two) times daily.    The patient is advised to call or return to clinic if he does not see an improvement in symptoms, or to seek the care of the closest emergency department if he worsens with the above plan.   Deliah BostonMichael Katrice Goel, MHS, PA-C [facility] 03/09/2016 2:07 PM

## 2016-03-11 ENCOUNTER — Ambulatory Visit (INDEPENDENT_AMBULATORY_CARE_PROVIDER_SITE_OTHER): Payer: Managed Care, Other (non HMO) | Admitting: Urgent Care

## 2016-03-11 VITALS — BP 142/90 | HR 64 | Temp 97.9°F | Resp 17 | Ht 69.0 in | Wt 230.0 lb

## 2016-03-11 DIAGNOSIS — L02419 Cutaneous abscess of limb, unspecified: Secondary | ICD-10-CM

## 2016-03-11 DIAGNOSIS — L03119 Cellulitis of unspecified part of limb: Secondary | ICD-10-CM

## 2016-03-11 NOTE — Progress Notes (Signed)
    MRN: [id number] DOB: 1955/12/10  Subjective:   Timothy Case is a 60 y.o. male presenting for follow up on incision and drainage. His initial visit was 03/09/2016, had I&D performed on an thigh abscess, started on doxycycline. Today, he reports significant improvement in pain, redness. Denies fever, spontaneous drainage of pus or bleeding. He has not changed his dressing. He is tolerating doxycycline better with food.  Timothy Case has a current medication list which includes the following prescription(s): atorvastatin, doxycycline, and ramipril. Also has No Known Allergies.  Timothy Case  has a past medical history of Chronic lower back pain; Essential familial hyperlipidemia; GERD (gastroesophageal reflux disease); Hyperlipidemia; Hypertension; Shortness of breath; and Stroke (HCC) (04/21/2012). Also  has a past surgical history that includes Knee arthroscopy (~ 2008).   Objective:   Vitals: BP (!) 142/90 (BP Location: Right Arm, Patient Position: Sitting, Cuff Size: Large)   Pulse 64   Temp 97.9 F (36.6 C) (Oral)   Resp 17   Ht 5\' 9"  (1.753 m)   Wt 230 lb (104.3 kg)   SpO2 99%   BMI 33.97 kg/m   Physical Exam  Constitutional: He is oriented to person, place, and time. He appears well-developed and well-nourished.  Cardiovascular: Normal rate.   Pulmonary/Chest: Effort normal.  Musculoskeletal:       Legs: Neurological: He is alert and oriented to person, place, and time.   WOUND CARE: Packing removed with purulent material present. An additional ~1cc of purulence was expressed. Wound repacked with 1/4" packing. Cleansed and dressed.  Assessment and Plan :   1. Cellulitis and abscess of leg - Wound care provided. RTC in 2 days for wound care.  Wallis BambergMario Dream Harman, PA-C [facility] [phone] 03/11/2016 8:03 AM

## 2016-03-11 NOTE — Patient Instructions (Signed)
     IF you received an x-ray today, you will receive an invoice from [facility]. Please contact [facility] at [phone] with questions or concerns regarding your invoice.   IF you received labwork today, you will receive an invoice from [facility]. Please contact [facility] at [phone] with questions or concerns regarding your invoice.   Our billing staff will not be able to assist you with questions regarding bills from these companies.  You will be contacted with the lab results as soon as they are available. The fastest way to get your results is to activate your My Chart account. Instructions are located on the last page of this paperwork. If you have not heard from us regarding the results in 2 weeks, please contact this office.      

## 2016-03-13 ENCOUNTER — Ambulatory Visit (INDEPENDENT_AMBULATORY_CARE_PROVIDER_SITE_OTHER): Payer: Managed Care, Other (non HMO) | Admitting: Family Medicine

## 2016-03-13 VITALS — BP 126/82 | HR 80 | Temp 98.8°F | Resp 18 | Ht 69.0 in

## 2016-03-13 DIAGNOSIS — Z5189 Encounter for other specified aftercare: Secondary | ICD-10-CM

## 2016-03-13 NOTE — Patient Instructions (Addendum)
     IF you received an x-ray today, you will receive an invoice from [facility]. Please contact  Radiology at [phone] with questions or concerns regarding your invoice.   IF you received labwork today, you will receive an invoice from [facility]. Please contact [facility] at [phone] with questions or concerns regarding your invoice.   Our billing staff will not be able to assist you with questions regarding bills from these companies.  You will be contacted with the lab results as soon as they are available. The fastest way to get your results is to activate your My Chart account. Instructions are located on the last page of this paperwork. If you have not heard from us regarding the results in 2 weeks, please contact this office.      

## 2016-03-13 NOTE — Progress Notes (Signed)
Wound care Visit Only:  HPI: Patient reports overall improvement of wound. He has allowed the wound to remain dry although he has changed the top portion of the dressing. He denies fever, chills, and reports only mild tenderness to touch. Day 5/10 of Doxycyline 100 mg twice daily. Tolerating medication better as he is taking with food.  Vitals:   03/13/16 0754  BP: 126/82  Pulse: 80  Resp: 18  Temp: 98.8 F (37.1 C)   Physical Exam: Wound bed moist without purulent drainage. Mild erythema present localized to the top of wound bed Mild tenderness with light touch  Assessment and Plan: -Irrigated with 5 cc of 1% lidocaine. -Redressed wound -No further follow-up required. Advise to follow-up only if needed. -Resume activities as normal   Godfrey PickKimberly S. Tiburcio PeaHarris, MSN, FNP-C [facility]

## 2017-04-22 ENCOUNTER — Encounter: Payer: Self-pay | Admitting: Family Medicine

## 2017-04-22 ENCOUNTER — Ambulatory Visit: Payer: Self-pay | Admitting: Family Medicine

## 2017-04-22 ENCOUNTER — Ambulatory Visit (INDEPENDENT_AMBULATORY_CARE_PROVIDER_SITE_OTHER): Payer: Managed Care, Other (non HMO) | Admitting: Family Medicine

## 2017-04-22 ENCOUNTER — Ambulatory Visit (INDEPENDENT_AMBULATORY_CARE_PROVIDER_SITE_OTHER)
Admission: RE | Admit: 2017-04-22 | Discharge: 2017-04-22 | Disposition: A | Discharge status: Home / Self Care | Payer: Managed Care, Other (non HMO) | Source: Ambulatory Visit | Attending: Family Medicine | Admitting: Family Medicine

## 2017-04-22 VITALS — BP 122/82 | HR 80 | Wt 232.0 lb

## 2017-04-22 DIAGNOSIS — M545 Low back pain, unspecified: Secondary | ICD-10-CM

## 2017-04-22 DIAGNOSIS — M533 Sacrococcygeal disorders, not elsewhere classified: Secondary | ICD-10-CM | POA: Insufficient documentation

## 2017-04-22 MED ORDER — GABAPENTIN 100 MG PO CAPS: 200.0000 mg | ORAL_CAPSULE | Freq: Every day | ORAL | 3 refills | Status: DC

## 2017-04-22 MED ORDER — PREDNISONE 50 MG PO TABS: 50.0000 mg | ORAL_TABLET | Freq: Every day | ORAL | 0 refills | Status: DC

## 2017-04-22 NOTE — Patient Instructions (Addendum)
Good to see you.  Ice 20 minutes 2 times daily. Usually after activity and before bed. Exercises 3 times a week.  prednisone daily for 7 days  Gabapentin 200mg  at night  Xray downstairs See me again in 2 weeks

## 2017-04-22 NOTE — Assessment & Plan Note (Signed)
Patient seems to be having more of a sacroiliac dysfunction as well as the potential for a piriformis. Lumbar radiculopathy does not seem to be occurring at this time. Patient given prednisone, gabapentin, home exercises and icing regimen. Patient getting x-rays of lumbar back. Worsening symptoms we'll consider further evaluation with advance imaging. Patient will be traveling over the next 2 weeks and will come back at that time.

## 2017-04-22 NOTE — Progress Notes (Signed)
[name] [address] [place], [place] [address] Phone: [phone] Subjective:    I'm seeing this patient by the request  of:    CC: Low back pain  QMV:[id number]  Timothy Case is a 62 y.o. male coming in with complaint of back pain. He said that he has had pain for the past year. He did some squats and kettle bell swings which caused some pain in the left lumbar spine. The pain does go into his left buttock. He has tried an opiod to curb his pain. He has tried prednisone before which did help. He is going to try to travel for work. He is in constant pain and was unable to workout since then. He does recall 1 month ago he had back pain when taking a swing on the golf course.  States localized to the area of the low back. Sometimes into the left buttocks. Minimal pain going to the leg. Patient denies any weakness. Still able to do daily activities but sometimes can be severe and did give him some difficulty with sleep. At its worst rates the severity of 8 out of 10     Past Medical History:  Diagnosis Date  . Chronic lower back pain    "q am; cause I'm overweight" (04/21/2012)  . Essential familial hyperlipidemia   . GERD (gastroesophageal reflux disease)   . Hyperlipidemia   . Hypertension   . Shortness of breath    "lying down; sometimes" (04/21/2012)  . Stroke (HCC) 04/21/2012   "they are leaning towards mini stroke today; all S/S still gone now except little chest pain" (04/21/2012)   Past Surgical History:  Procedure Laterality Date  . KNEE ARTHROSCOPY  ~ 2008   "both knees; ~ 6 months apart" (04/21/2012)   Social History   Socioeconomic History  . Marital status: Married    Spouse name: None  . Number of children: None  . Years of education: None  . Highest education level: None  Social Needs  . Financial resource strain: None  . Food insecurity - worry: None  . Food insecurity - inability: None  . Transportation needs - medical: None   . Transportation needs - non-medical: None  Occupational History  . None  Tobacco Use  . Smoking status: Never Smoker  . Smokeless tobacco: Never Used  Substance and Sexual Activity  . Alcohol use: Yes    Alcohol/week: 3.6 oz    Types: 6 Glasses of wine per week    Comment: 04/21/2012 "few glasses of wine a few days in a row; none in a few days; @ the most I'll have is 2 glass of wine/day; average ~ 6 glasses/wk"  . Drug use: No  . Sexual activity: Yes  Other Topics Concern  . None  Social History Narrative  . None   No Known Allergies No family history on file.   Past medical history, social, surgical and family history all reviewed in electronic medical record.  No pertanent information unless stated regarding to the chief complaint.   Review of Systems:Review of systems updated and as accurate as of 04/22/17  No headache, visual changes, nausea, vomiting, diarrhea, constipation, dizziness, abdominal pain, skin rash, fevers, chills, night sweats, weight loss, swollen lymph nodes, body aches, joint swelling,  chest pain, shortness of breath, mood changes. Positive muscle aches  Objective  Blood pressure 122/82, pulse 80, weight 232 lb (105.2 kg), SpO2 97 %. Systems examined below as of 04/22/17  General: No apparent distress alert and oriented x3 mood and affect normal, dressed appropriately.  HEENT: Pupils equal, extraocular movements intact  Respiratory: Patient's speak in full sentences and does not appear short of breath  Cardiovascular: No lower extremity edema, non tender, no erythema  Skin: Warm dry intact with no signs of infection or rash on extremities or on axial skeleton.  Abdomen: Soft nontender  Neuro: Cranial nerves II through XII are intact, neurovascularly intact in all extremities with 2+ DTRs and 2+ pulses.  Lymph: No lymphadenopathy of posterior or anterior cervical chain or axillae bilaterally.  Gait normal with good balance and coordination.  MSK:  Non  tender with full range of motion and good stability and symmetric strength and tone of shoulders, elbows, wrist, hip, knee and ankles bilaterally.  Back Exam:  Inspection: Mild loss in lordosis with poor core strength Motion: Flexion 35 deg, Extension 25 deg, Side Bending to 45 deg bilaterally,  Rotation to 35 deg bilaterally  SLR laying: Negative  XSLR laying: Negative  Palpable tenderness: Severe tenderness to palpation in the left piriformis as well as the left sacroiliac joint. FABER: Positive left. Sensory change: Gross sensation intact to all lumbar and sacral dermatomes.  Reflexes: 2+ at both patellar tendons, 2+ at achilles tendons, Babinski's downgoing.  Strength at foot  Plantar-flexion: 5/5 Dorsi-flexion: 5/5 Eversion: 5/5 Inversion: 5/5  Leg strength  Quad: 5/5 Hamstring: 5/5 Hip flexor: 5/5 Hip abductors: 5/5  Gait unremarkable.  [id number]; 15 additional minutes spent for Therapeutic exercises as stated in above notes.  This included exercises focusing on stretching, strengthening, with significant focus on eccentric aspects.   Long term goals include an improvement in range of motion, strength, endurance as well as avoiding reinjury. Patient's frequency would include in 1-2 times a day, 3-5 times a week for a duration of 6-12 weeks.  Low back exercises that included:  Pelvic tilt/bracing instruction to focus on control of the pelvic girdle and lower abdominal muscles  Glute strengthening exercises, focusing on proper firing of the glutes without engaging the low back muscles Proper stretching techniques for maximum relief for the hamstrings, hip flexors, low back and some rotation where tolerated  Proper technique shown and discussed handout in great detail with ATC.  All questions were discussed and answered.     Impression and Recommendations:     This case required medical decision making of moderate complexity.      Note: This dictation was prepared with Dragon  dictation along with smaller phrase technology. Any transcriptional errors that result from this process are unintentional.

## 2017-05-05 NOTE — Progress Notes (Signed)
[facility] [address] [place], [place] [address] Phone: [phone] Subjective:      CC: Back pain follow-up  BJY:NWGNFAOZHY  Timothy Case is a 63 y.o. male coming in with complaint of.  Was found to have more of a sacroiliac dysfunction.  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  Patient states along with the prednisone is feeling significantly better.  Still more soreness in the piriformis.  States in the exercises have been helping.  Doing the exercises occasionally patient feels he has made good progress though.     Past Medical History:  Diagnosis Date  . Chronic lower back pain    "q am; cause I'm overweight" (04/21/2012)  . Essential familial hyperlipidemia   . GERD (gastroesophageal reflux disease)   . Hyperlipidemia   . Hypertension   . Shortness of breath    "lying down; sometimes" (04/21/2012)  . Stroke (HCC) 04/21/2012   "they are leaning towards mini stroke today; all S/S still gone now except little chest pain" (04/21/2012)   Past Surgical History:  Procedure Laterality Date  . KNEE ARTHROSCOPY  ~ 2008   "both knees; ~ 6 months apart" (04/21/2012)   Social History   Socioeconomic History  . Marital status: Married    Spouse name: Not on file  . Number of children: Not on file  . Years of education: Not on file  . Highest education level: Not on file  Social Needs  . Financial resource strain: Not on file  . Food insecurity - worry: Not on file  . Food insecurity - inability: Not on file  . Transportation needs - medical: Not on file  . Transportation needs - non-medical: Not on file  Occupational History  . Not on file  Tobacco Use  . Smoking status: Never Smoker  . Smokeless tobacco: Never Used  Substance and Sexual Activity  . Alcohol use: Yes   Alcohol/week: 3.6 oz    Types: 6 Glasses of wine per week    Comment: 04/21/2012 "few glasses of wine a few days in a row; none in a few days; @ the most I'll have is 2 glass of wine/day; average ~ 6 glasses/wk"  . Drug use: No  . Sexual activity: Yes  Other Topics Concern  . Not on file  Social History Narrative  . Not on file   No Known Allergies No family history on file.   Past medical history, social, surgical and family history all reviewed in electronic medical record.  No pertanent information unless stated regarding to the chief complaint.   Review of Systems:Review of systems updated and as accurate as of 05/05/17  No headache, visual changes, nausea, vomiting, diarrhea, constipation, dizziness, abdominal pain, skin rash, fevers, chills, night sweats, weight loss, swollen lymph nodes, body aches, joint swelling, muscle aches, chest pain, shortness of breath, mood changes.   Objective  There were no vitals taken for this visit. Systems examined below as of 05/05/17   General: No apparent distress alert and oriented x3 mood and affect normal, dressed appropriately.  HEENT: Pupils equal, extraocular movements intact  Respiratory: Patient's speak in full sentences and does not appear short of breath  Cardiovascular: No lower extremity edema, non tender, no erythema  Skin: Warm dry intact with no signs of infection or rash on extremities or on  axial skeleton.  Abdomen: Soft nontender  Neuro: Cranial nerves II through XII are intact, neurovascularly intact in all extremities with 2+ DTRs and 2+ pulses.  Lymph: No lymphadenopathy of posterior or anterior cervical chain or axillae bilaterally.  Gait normal with good balance and coordination.  MSK:  Non tender with full range of motion and good stability and symmetric strength and tone of shoulders, elbows, wrist, hip, knee and ankles bilaterally.  Back Exam:  Inspection: Mild loss of lordosis Motion: Flexion 45 deg, Extension 25  deg, Side Bending to 35 deg bilaterally,  Rotation to 40 deg bilaterally  SLR laying: Negative  XSLR laying: Negative  Palpable tenderness: Tenderness to palpation in the paraspinal musculature lumbar spine right greater than left.Marland Kitchen. FABER: Positive Faber.  Left side Sensory change: Gross sensation intact to all lumbar and sacral dermatomes.  Reflexes: 2+ at both patellar tendons, 2+ at achilles tendons, Babinski's downgoing.  Strength at foot  Plantar-flexion: 5/5 Dorsi-flexion: 5/5 Eversion: 5/5 Inversion: 5/5  Leg strength  Quad: 5/5 Hamstring: 5/5 Hip flexor: 5/5 Hip abductors: 4/5 but symmetric Gait unremarkable.  Osteopathic findings T3 extended rotated and side bent right inhaled third rib T9 extended rotated and side bent left L2 flexed rotated and side bent right Sacrum left on left    Impression and Recommendations:     This case required medical decision making of moderate complexity.      Note: This dictation was prepared with Dragon dictation along with smaller phrase technology. Any transcriptional errors that result from this process are unintentional.

## 2017-05-06 ENCOUNTER — Ambulatory Visit (INDEPENDENT_AMBULATORY_CARE_PROVIDER_SITE_OTHER): Payer: Managed Care, Other (non HMO) | Admitting: Family Medicine

## 2017-05-06 ENCOUNTER — Encounter: Payer: Self-pay | Admitting: Family Medicine

## 2017-05-06 DIAGNOSIS — M533 Sacrococcygeal disorders, not elsewhere classified: Secondary | ICD-10-CM

## 2017-05-06 DIAGNOSIS — M999 Biomechanical lesion, unspecified: Secondary | ICD-10-CM

## 2017-05-06 MED ORDER — PREDNISONE 50 MG PO TABS: 50.0000 mg | ORAL_TABLET | Freq: Every day | ORAL | 0 refills | Status: DC

## 2017-05-06 NOTE — Assessment & Plan Note (Signed)
Decision today to treat with OMT was based on Physical Exam  After verbal consent patient was treated with HVLA, ME, FPR techniques in  thoracic, lumbar and sacral areas  Patient tolerated the procedure well with improvement in symptoms  Patient given exercises, stretches and lifestyle modifications  See medications in patient instructions if given  Patient will follow up in 4 weeks 

## 2017-05-06 NOTE — Assessment & Plan Note (Signed)
Likely contributing to most of the pain.  Has gabapentin for nighttime prednisone given again with him traveling.  We discussed icing regimen.  Home exercises.  Responded well to osteopathic manipulation.  Follow-up again in 4 weeks

## 2017-05-06 NOTE — Patient Instructions (Signed)
4-6 weeks

## 2017-06-16 ENCOUNTER — Ambulatory Visit: Payer: Managed Care, Other (non HMO) | Admitting: Family Medicine

## 2017-06-16 NOTE — Progress Notes (Signed)
[name] [address] [place], [place] [address] Phone: [phone] Subjective:    I'm seeing this patient by the request  of:    CC: Back pain follow-up  BJY:NWGNFAOZHY  Timothy Case is a 62 y.o. male coming in with complaint of neck pain.  Was diagnosed with Baastrups disease mild arthritis.  Responded well to the prednisone and gabapentin.  We also responded well to osteopathic manipulation.  Patient states was doing better but now having worsening pain again.  Seems to be on the left lower back.  Some radiation to the foot.  When he runs he gets numbness more of the calf and the lower side of the foot.     Past Medical History:  Diagnosis Date  . Chronic lower back pain    "q am; cause I'm overweight" (04/21/2012)  . Essential familial hyperlipidemia   . GERD (gastroesophageal reflux disease)   . Hyperlipidemia   . Hypertension   . Shortness of breath    "lying down; sometimes" (04/21/2012)  . Stroke (HCC) 04/21/2012   "they are leaning towards mini stroke today; all S/S still gone now except little chest pain" (04/21/2012)   Past Surgical History:  Procedure Laterality Date  . KNEE ARTHROSCOPY  ~ 2008   "both knees; ~ 6 months apart" (04/21/2012)   Social History   Socioeconomic History  . Marital status: Married    Spouse name: None  . Number of children: None  . Years of education: None  . Highest education level: None  Social Needs  . Financial resource strain: None  . Food insecurity - worry: None  . Food insecurity - inability: None  . Transportation needs - medical: None  . Transportation needs - non-medical: None  Occupational History  . None  Tobacco Use  . Smoking status: Never Smoker  . Smokeless tobacco: Never Used  Substance and Sexual Activity  . Alcohol use: Yes    Alcohol/week: 3.6 oz    Types: 6 Glasses of wine per week    Comment: 04/21/2012 "few glasses of wine a few days in a row; none in a few days;  @ the most I'll have is 2 glass of wine/day; average ~ 6 glasses/wk"  . Drug use: No  . Sexual activity: Yes  Other Topics Concern  . None  Social History Narrative  . None   No Known Allergies No family history on file.   Past medical history, social, surgical and family history all reviewed in electronic medical record.  No pertanent information unless stated regarding to the chief complaint.   Review of Systems:Review of systems updated and as accurate as of 06/17/17  No headache, visual changes, nausea, vomiting, diarrhea, constipation, dizziness, abdominal pain, skin rash, fevers, chills, night sweats, weight loss, swollen lymph nodes, body aches, joint swelling, chest pain, shortness of breath, mood changes.  Positive muscle aches  Objective  Blood pressure 120/70, pulse 84, height 5\' 9"  (1.753 m), weight 223 lb (101.2 kg), SpO2 97 %. Systems examined below as of 06/17/17   General: No apparent distress alert and oriented x3 mood and affect normal, dressed appropriately.  HEENT: Pupils equal, extraocular movements intact  Respiratory: Patient's speak in full sentences and does not appear short of breath  Cardiovascular: No lower extremity edema, non tender, no erythema  Skin: Warm dry intact with no signs of infection or rash on extremities or on axial skeleton.  Abdomen: Soft nontender  Neuro: Cranial nerves II  through XII are intact, neurovascularly intact in all extremities with 2+ DTRs and 2+ pulses.  Lymph: No lymphadenopathy of posterior or anterior cervical chain or axillae bilaterally.  Gait normal with good balance and coordination.  MSK:  Non tender with full range of motion and good stability and symmetric strength and tone of shoulders, elbows, wrist, hip, knee and ankles bilaterally.  Back Exam:  Inspection: Mild loss of lordosis Motion: Flexion 45 deg, Extension 30 deg, Side Bending to 45 deg bilaterally,  Rotation to 45 deg bilaterally  SLR laying: Mild  increase in sensation in the foot on the left side XSLR laying: Negative  Palpable tenderness: Tender to palpation the paraspinal musculature L5-S1 on the left side.Marland Kitchen. FABER: Positive left. Sensory change: Gross sensation intact to all lumbar and sacral dermatomes.  Reflexes: 2+ at both patellar tendons, 2+ at achilles tendons, Babinski's downgoing.  Strength at foot  Plantar-flexion: 5/5 Dorsi-flexion: 5/5 Eversion: 5/5 Inversion: 5/5  Leg strength  Quad: 5/5 Hamstring: 5/5 Hip flexor: 5/5 Hip abductors: 5/5  Gait unremarkable.  Osteopathic findings  C7 flexed rotated and side bent left T3 extended rotated and side bent right inhaled third rib T6 extended rotated and side bent left L2 flexed rotated and side bent right Sacrum left on left     Impression and Recommendations:     This case required medical decision making of moderate complexity.      Note: This dictation was prepared with Dragon dictation along with smaller phrase technology. Any transcriptional errors that result from this process are unintentional.

## 2017-06-17 ENCOUNTER — Ambulatory Visit (INDEPENDENT_AMBULATORY_CARE_PROVIDER_SITE_OTHER): Payer: Managed Care, Other (non HMO) | Admitting: Family Medicine

## 2017-06-17 ENCOUNTER — Encounter: Payer: Self-pay | Admitting: Family Medicine

## 2017-06-17 VITALS — BP 120/70 | HR 84 | Ht 69.0 in | Wt 223.0 lb

## 2017-06-17 DIAGNOSIS — M533 Sacrococcygeal disorders, not elsewhere classified: Secondary | ICD-10-CM | POA: Diagnosis not present

## 2017-06-17 DIAGNOSIS — M999 Biomechanical lesion, unspecified: Secondary | ICD-10-CM

## 2017-06-17 MED ORDER — GABAPENTIN 100 MG PO CAPS: 200.0000 mg | ORAL_CAPSULE | Freq: Every day | ORAL | 3 refills | Status: DC

## 2017-06-17 NOTE — Assessment & Plan Note (Signed)
Patient does have more of a degenerative disc disease.  I do believe the sacroiliac joint is playing a role as well.  We discussed the possibility of advanced imaging if this continues.  Also discussed the possibility of a sacroiliac injection or a piriformis injection.  Patient is in agreement with plan at the moment.  Will follow up with me again in 4-6 weeks.

## 2017-06-17 NOTE — Assessment & Plan Note (Signed)
Decision today to treat with OMT was based on Physical Exam  After verbal consent patient was treated with HVLA, ME, FPR techniques in cervical, thoracic, lumbar and sacral areas  Patient tolerated the procedure well with improvement in symptoms  Patient given exercises, stretches and lifestyle modifications  See medications in patient instructions if given  Patient will follow up in 4 weeks 

## 2017-06-17 NOTE — Patient Instructions (Signed)
Gabapentin 200mg  at night Keep up with the exercises See me again in 3 weeks and if not better will do a piriformis injection

## 2017-07-08 ENCOUNTER — Encounter: Payer: Self-pay | Admitting: Family Medicine

## 2017-07-08 ENCOUNTER — Ambulatory Visit (INDEPENDENT_AMBULATORY_CARE_PROVIDER_SITE_OTHER): Payer: Managed Care, Other (non HMO) | Admitting: Family Medicine

## 2017-07-08 VITALS — BP 110/80 | HR 75 | Ht 69.0 in | Wt 221.0 lb

## 2017-07-08 DIAGNOSIS — M999 Biomechanical lesion, unspecified: Secondary | ICD-10-CM

## 2017-07-08 DIAGNOSIS — M533 Sacrococcygeal disorders, not elsewhere classified: Secondary | ICD-10-CM

## 2017-07-08 MED ORDER — PREDNISONE 50 MG PO TABS: 50.0000 mg | ORAL_TABLET | Freq: Every day | ORAL | 0 refills | Status: DC

## 2017-07-08 MED ORDER — TRAMADOL HCL 50 MG PO TABS: 50.0000 mg | ORAL_TABLET | Freq: Three times a day (TID) | ORAL | 0 refills | Status: DC | PRN

## 2017-07-08 NOTE — Patient Instructions (Signed)
Good to see you  [employer] "total support" online would be great  I think the massage is doing wonders.  Stay active If in a lot of pain then use the prednisone and the tramadol  Have a great trip see me again when you get back if you need me

## 2017-07-08 NOTE — Assessment & Plan Note (Addendum)
Decision today to treat with OMT was based on Physical Exam  After verbal consent patient was treated with HVLA, ME, FPR techniques in  thoracic, lumbar and sacral areas  Patient tolerated the procedure well with improvement in symptoms  Patient given exercises, stretches and lifestyle modifications  See medications in patient instructions if given  Patient will follow up in 4-6 weeks 

## 2017-07-08 NOTE — Assessment & Plan Note (Signed)
Has been doing remarkably better at this time.  We discussed icing regimen and home exercises.  Discussed which activities would be done.  We discussed icing regimen.  Discussed medications in with him going out of the country for nearly 3 weeks given prednisone in case he has an exacerbation.  Follow-up with me again in 4-6 weeks

## 2017-07-08 NOTE — Progress Notes (Signed)
[name] [address] [place], [place] [address] Phone: [phone] Subjective:    CC:   BJY:NWGNFAOZHY  Timothy Case is a 62 y.o. male coming in with complaint of back pain. He has received a massage 3 times recently. Massage has helped his pain. He also is more active. Patient runs 3 times a week.  Overall patient is making improvement.  Feels that the manual massage has been seen as well.  Patient states nothing is severe as what he had previously.       Past Medical History:  Diagnosis Date  . Chronic lower back pain    "q am; cause I'm overweight" (04/21/2012)  . Essential familial hyperlipidemia   . GERD (gastroesophageal reflux disease)   . Hyperlipidemia   . Hypertension   . Shortness of breath    "lying down; sometimes" (04/21/2012)  . Stroke (HCC) 04/21/2012   "they are leaning towards mini stroke today; all S/S still gone now except little chest pain" (04/21/2012)   Past Surgical History:  Procedure Laterality Date  . KNEE ARTHROSCOPY  ~ 2008   "both knees; ~ 6 months apart" (04/21/2012)   Social History   Socioeconomic History  . Marital status: Married    Spouse name: Not on file  . Number of children: Not on file  . Years of education: Not on file  . Highest education level: Not on file  Occupational History  . Not on file  Social Needs  . Financial resource strain: Not on file  . Food insecurity:    Worry: Not on file    Inability: Not on file  . Transportation needs:    Medical: Not on file    Non-medical: Not on file  Tobacco Use  . Smoking status: Never Smoker  . Smokeless tobacco: Never Used  Substance and Sexual Activity  . Alcohol use: Yes    Alcohol/week: 3.6 oz    Types: 6 Glasses of wine per week    Comment: 04/21/2012 "few glasses of wine a few days in a row; none in a few days; @ the most I'll have is 2 glass of wine/day; average ~ 6 glasses/wk"  . Drug use: No  . Sexual activity: Yes    Lifestyle  . Physical activity:    Days per week: Not on file    Minutes per session: Not on file  . Stress: Not on file  Relationships  . Social connections:    Talks on phone: Not on file    Gets together: Not on file    Attends religious service: Not on file    Active member of club or organization: Not on file    Attends meetings of clubs or organizations: Not on file    Relationship status: Not on file  Other Topics Concern  . Not on file  Social History Narrative  . Not on file   No Known Allergies No family history on file.   Past medical history, social, surgical and family history all reviewed in electronic medical record.  No pertanent information unless stated regarding to the chief complaint.   Review of Systems:Review of systems updated and as accurate as of 07/08/17  No headache, visual changes, nausea, vomiting, diarrhea, constipation, dizziness, abdominal pain, skin rash, fevers, chills, night sweats, weight loss, swollen lymph nodes, body aches, joint swelling, muscle aches, chest pain, shortness of breath, mood changes.   Objective  Blood pressure 110/80, pulse 75, height 5\' 9"  (1.753  m), weight 221 lb (100.2 kg), SpO2 98 %. Systems examined below as of 07/08/17   General: No apparent distress alert and oriented x3 mood and affect normal, dressed appropriately.  HEENT: Pupils equal, extraocular movements intact  Respiratory: Patient's speak in full sentences and does not appear short of breath  Cardiovascular: No lower extremity edema, non tender, no erythema  Skin: Warm dry intact with no signs of infection or rash on extremities or on axial skeleton.  Abdomen: Soft nontender  Neuro: Cranial nerves II through XII are intact, neurovascularly intact in all extremities with 2+ DTRs and 2+ pulses.  Lymph: No lymphadenopathy of posterior or anterior cervical chain or axillae bilaterally.  Gait normal with good balance and coordination.  MSK:  Non tender with  full range of motion and good stability and symmetric strength and tone of shoulders, elbows, wrist, hip, knee and ankles bilaterally.  Back Exam:  Inspection: Unremarkable  Motion: Flexion 45 deg, Extension 35 deg, Side Bending to 45 deg bilaterally,  Rotation to 45 deg bilaterally  SLR laying: Negative  XSLR laying: Negative  Palpable tenderness: Tenderness to palpation the paraspinal musculature lumbar spine. FABER: negative. Sensory change: Gross sensation intact to all lumbar and sacral dermatomes.  Reflexes: 2+ at both patellar tendons, 2+ at achilles tendons, Babinski's downgoing.  Strength at foot  Plantar-flexion: 5/5 Dorsi-flexion: 5/5 Eversion: 5/5 Inversion: 5/5  Leg strength  Quad: 5/5 Hamstring: 5/5 Hip flexor: 5/5 Hip abductors: 4/5  Gait unremarkable.  Osteopathic findings  T6 extended rotated and side bent left L2 flexed rotated and side bent right Sacrum right on right      Impression and Recommendations:     This case required medical decision making of moderate complexity.      Note: This dictation was prepared with Dragon dictation along with smaller phrase technology. Any transcriptional errors that result from this process are unintentional.

## 2019-06-15 ENCOUNTER — Other Ambulatory Visit: Payer: Self-pay

## 2019-06-15 ENCOUNTER — Ambulatory Visit (INDEPENDENT_AMBULATORY_CARE_PROVIDER_SITE_OTHER): Payer: Medicare Other | Admitting: Sports Medicine

## 2019-06-15 DIAGNOSIS — M7742 Metatarsalgia, left foot: Secondary | ICD-10-CM | POA: Diagnosis present

## 2019-06-15 NOTE — H&P (Addendum)
PCP: Patient, No Pcp Per  Subjective:   HPI: Patient is a 64 y.o. male here for left forefoot pain.  Timothy Case is a 64 year old male with non-pertinent PMHx presenting with pain located on the plantar surface of the left forefoot located at the base of the 3rd metatarsal. He describes the pain as dull/achy and is a 5/10 in severity. It has been present for 2-3 months however he notes he has had similar pain in years prior that self-resolved. Pain is especially notable with jogging/walking however he has continued to exercise despite the pain. Tylenol/Advil have helped take the edge off the pain but nothing has completely resolved symptoms. At this time he is tired of dealing with problem as it has hampered desire to exercise.   Past Medical History:  Diagnosis Date  . Chronic lower back pain    "q am; cause I'm overweight" (04/21/2012)  . Essential familial hyperlipidemia   . GERD (gastroesophageal reflux disease)   . Hyperlipidemia   . Hypertension   . Shortness of breath    "lying down; sometimes" (04/21/2012)  . Stroke ([facility]) 04/21/2012   "they are leaning towards mini stroke today; all S/S still gone now except little chest pain" (04/21/2012)    Current Outpatient Medications on File Prior to Visit  Medication Sig Dispense Refill  . atorvastatin (LIPITOR) 20 MG tablet Take 20 mg by mouth daily.     . ramipril (ALTACE) 5 MG capsule Take 10 mg by mouth daily.     . rosuvastatin (CRESTOR) 20 MG tablet Take by mouth.    . gabapentin (NEURONTIN) 100 MG capsule Take 2 capsules (200 mg total) by mouth at bedtime. (Patient not taking: Reported on 06/15/2019) 60 capsule 3  . predniSONE (DELTASONE) 50 MG tablet Take 1 tablet (50 mg total) by mouth daily. (Patient not taking: Reported on 06/15/2019) 5 tablet 0  . predniSONE (DELTASONE) 50 MG tablet Take 1 tablet (50 mg total) by mouth daily. (Patient not taking: Reported on 06/15/2019) 5 tablet 0  . traMADol (ULTRAM) 50 MG tablet Take 1 tablet (50  mg total) by mouth every 8 (eight) hours as needed. (Patient not taking: Reported on 06/15/2019) 30 tablet 0   No current facility-administered medications on file prior to visit.    Past Surgical History:  Procedure Laterality Date  . KNEE ARTHROSCOPY  ~ 2008   "both knees; ~ 6 months apart" (04/21/2012)    No Known Allergies  Social History   Socioeconomic History  . Marital status: Married    Spouse name: Not on file  . Number of children: Not on file  . Years of education: Not on file  . Highest education level: Not on file  Occupational History  . Not on file  Tobacco Use  . Smoking status: Never Smoker  . Smokeless tobacco: Never Used  Substance and Sexual Activity  . Alcohol use: Yes    Alcohol/week: 6.0 standard drinks    Types: 6 Glasses of wine per week    Comment: 04/21/2012 "few glasses of wine a few days in a row; none in a few days; @ the most I'll have is 2 glass of wine/day; average ~ 6 glasses/wk"  . Drug use: No  . Sexual activity: Yes  Other Topics Concern  . Not on file  Social History Narrative  . Not on file   Social Determinants of Health   Financial Resource Strain:   . Difficulty of Paying Living Expenses: Not on file  Food Insecurity:   . Worried About Programme researcher, broadcasting/film/video in the Last Year: Not on file  . Ran Out of Food in the Last Year: Not on file  Transportation Needs:   . Lack of Transportation (Medical): Not on file  . Lack of Transportation (Non-Medical): Not on file  Physical Activity:   . Days of Exercise per Week: Not on file  . Minutes of Exercise per Session: Not on file  Stress:   . Feeling of Stress : Not on file  Social Connections:   . Frequency of Communication with Friends and Family: Not on file  . Frequency of Social Gatherings with Friends and Family: Not on file  . Attends Religious Services: Not on file  . Active Member of Clubs or Organizations: Not on file  . Attends Banker Meetings: Not on file  .  Marital Status: Not on file  Intimate Partner Violence:   . Fear of Current or Ex-Partner: Not on file  . Emotionally Abused: Not on file  . Physically Abused: Not on file  . Sexually Abused: Not on file    No family history on file.  BP 132/90   Ht 5\' 9"  (1.753 m)   Wt 225 lb (102.1 kg)   BMI 33.23 kg/m   Review of Systems: See HPI above.     Objective:  Physical Exam:  General: Alert and oriented x3, NAD, comfortable in exam room HEENT: normocephalic and atraumatic, PERRLA, EOMI, conjunctivae and sclerae clear, vision grossly intact, MMM, no erythema or exudates, trachea midline, Cardio: RRR, normal S1/S2 with no M/R/G,  Resp: breathing easily on RA, lungs clear to auscultation bilaterally,  Left Foot -no overlying bruising/effusions/bony abnormalities -palpation at base of 3rd metatarsal recreates pain, no pain on palpation of dorsal surface of 3rd metatarsal/between adjacent metatarsal bones/or MTPs -normal active and passive ROM -strength 5/5 on plantar and dorsiflexion -negative metatarsal squeeze test   Assessment & Plan:   Timothy Case is a 64 year old male presenting with 2-3 months of left plantar forefoot pain located at the base of the 3rd metatarsal which is reproducible on exam suspicious for metatarsalgia. At this time, history of chronic pain without trauma, located focally at the base of t. Morton neuroma also on differential however pain is not located between metatarsal bones and patient lacks neuropathic symptoms radiating to toes. Low suspicion for stress fracture given negative metatarsal squeeze test and no history of increasing physical activity. Will treat with metatarsal pad to alleviate stress on bone and reconstitute proper architecture of transverse arch. Additional imaging not necessary given low suspicion of fracture. Patient may continue use of NSAIDS PRN for discomfort.   74, MS4  Patient seen and evaluated with the medical student.  I  agree with the above plan of care.  Physical exam shows tenderness to palpation right over the third metatarsal on the plantar aspect of the foot.  No tenderness along the dorsum of the foot.  No soft tissue swelling.  This appears to be simple metatarsalgia.  We will treat with a metatarsal pad and activity modification.  No imaging needed at this time but if symptoms persist I would start with getting an x-ray of his foot.  Follow-up for ongoing or recalcitrant issues.

## 2019-06-15 NOTE — Patient Instructions (Addendum)
You have a condition known as metatarsalgia.  Metatarsal pads to help lift transverse arch are mainstay of treatment and pain should improve over the next few weeks. You can use NSAIDS as needed for pain/discomfort.  If you develop pain on the top of your foot, that is reason to return for repeat evaluation.

## 2020-01-17 ENCOUNTER — Telehealth: Payer: Self-pay | Admitting: General Practice

## 2020-01-17 NOTE — Telephone Encounter (Signed)
The patient's wife called to see if he can re-establish with Dr. Caryl Never.  Can I schedule this patient?

## 2020-01-18 NOTE — Telephone Encounter (Signed)
OK 

## 2020-01-19 NOTE — Telephone Encounter (Signed)
Patient is scheduled for a new patient appointment on 02/01/2020 at 9:30 AM and NPP sent 01/19/2020

## 2020-02-01 ENCOUNTER — Ambulatory Visit: Payer: Medicare Other | Admitting: Family Medicine

## 2020-05-04 ENCOUNTER — Emergency Department (HOSPITAL_COMMUNITY): Payer: Medicare Other

## 2020-05-04 ENCOUNTER — Inpatient Hospital Stay (HOSPITAL_COMMUNITY)
Admission: EM | Admit: 2020-05-04 | Discharge: 2020-05-12 | DRG: 082 | Disposition: A | Discharge status: Rehabilitation Facility | Payer: Medicare Other | Attending: Internal Medicine | Admitting: Internal Medicine

## 2020-05-04 ENCOUNTER — Inpatient Hospital Stay (HOSPITAL_COMMUNITY): Payer: Medicare Other

## 2020-05-04 ENCOUNTER — Other Ambulatory Visit: Payer: Self-pay

## 2020-05-04 DIAGNOSIS — I480 Paroxysmal atrial fibrillation: Secondary | ICD-10-CM | POA: Diagnosis present

## 2020-05-04 DIAGNOSIS — Z7982 Long term (current) use of aspirin: Secondary | ICD-10-CM | POA: Diagnosis not present

## 2020-05-04 DIAGNOSIS — R4701 Aphasia: Secondary | ICD-10-CM | POA: Diagnosis present

## 2020-05-04 DIAGNOSIS — Z8673 Personal history of transient ischemic attack (TIA), and cerebral infarction without residual deficits: Secondary | ICD-10-CM | POA: Diagnosis not present

## 2020-05-04 DIAGNOSIS — Z20822 Contact with and (suspected) exposure to covid-19: Secondary | ICD-10-CM | POA: Diagnosis present

## 2020-05-04 DIAGNOSIS — S0083XA Contusion of other part of head, initial encounter: Secondary | ICD-10-CM

## 2020-05-04 DIAGNOSIS — I639 Cerebral infarction, unspecified: Secondary | ICD-10-CM | POA: Diagnosis not present

## 2020-05-04 DIAGNOSIS — H1132 Conjunctival hemorrhage, left eye: Secondary | ICD-10-CM | POA: Diagnosis present

## 2020-05-04 DIAGNOSIS — S0512XA Contusion of eyeball and orbital tissues, left eye, initial encounter: Secondary | ICD-10-CM | POA: Diagnosis present

## 2020-05-04 DIAGNOSIS — E876 Hypokalemia: Secondary | ICD-10-CM | POA: Diagnosis present

## 2020-05-04 DIAGNOSIS — E669 Obesity, unspecified: Secondary | ICD-10-CM | POA: Diagnosis present

## 2020-05-04 DIAGNOSIS — K5901 Slow transit constipation: Secondary | ICD-10-CM | POA: Diagnosis present

## 2020-05-04 DIAGNOSIS — S0211GA Other fracture of occiput, right side, initial encounter for closed fracture: Secondary | ICD-10-CM | POA: Diagnosis present

## 2020-05-04 DIAGNOSIS — S066X9A Traumatic subarachnoid hemorrhage with loss of consciousness of unspecified duration, initial encounter: Principal | ICD-10-CM | POA: Diagnosis present

## 2020-05-04 DIAGNOSIS — R451 Restlessness and agitation: Secondary | ICD-10-CM

## 2020-05-04 DIAGNOSIS — I4891 Unspecified atrial fibrillation: Secondary | ICD-10-CM | POA: Diagnosis not present

## 2020-05-04 DIAGNOSIS — Z6834 Body mass index (BMI) 34.0-34.9, adult: Secondary | ICD-10-CM

## 2020-05-04 DIAGNOSIS — I110 Hypertensive heart disease with heart failure: Secondary | ICD-10-CM | POA: Diagnosis present

## 2020-05-04 DIAGNOSIS — S065XAA Traumatic subdural hemorrhage with loss of consciousness status unknown, initial encounter: Secondary | ICD-10-CM | POA: Diagnosis present

## 2020-05-04 DIAGNOSIS — S069X3S Unspecified intracranial injury with loss of consciousness of 1 hour to 5 hours 59 minutes, sequela: Secondary | ICD-10-CM | POA: Diagnosis not present

## 2020-05-04 DIAGNOSIS — R0902 Hypoxemia: Secondary | ICD-10-CM | POA: Diagnosis not present

## 2020-05-04 DIAGNOSIS — S069X0S Unspecified intracranial injury without loss of consciousness, sequela: Secondary | ICD-10-CM | POA: Diagnosis not present

## 2020-05-04 DIAGNOSIS — K5900 Constipation, unspecified: Secondary | ICD-10-CM | POA: Diagnosis present

## 2020-05-04 DIAGNOSIS — E7849 Other hyperlipidemia: Secondary | ICD-10-CM | POA: Diagnosis present

## 2020-05-04 DIAGNOSIS — W19XXXA Unspecified fall, initial encounter: Secondary | ICD-10-CM | POA: Diagnosis present

## 2020-05-04 DIAGNOSIS — S02119D Unspecified fracture of occiput, subsequent encounter for fracture with routine healing: Secondary | ICD-10-CM | POA: Diagnosis not present

## 2020-05-04 DIAGNOSIS — S069X2D Unspecified intracranial injury with loss of consciousness of 31 minutes to 59 minutes, subsequent encounter: Secondary | ICD-10-CM | POA: Diagnosis not present

## 2020-05-04 DIAGNOSIS — S069X2S Unspecified intracranial injury with loss of consciousness of 31 minutes to 59 minutes, sequela: Secondary | ICD-10-CM | POA: Diagnosis not present

## 2020-05-04 DIAGNOSIS — Z7952 Long term (current) use of systemic steroids: Secondary | ICD-10-CM

## 2020-05-04 DIAGNOSIS — R5381 Other malaise: Secondary | ICD-10-CM | POA: Diagnosis present

## 2020-05-04 DIAGNOSIS — Z8616 Personal history of COVID-19: Secondary | ICD-10-CM | POA: Diagnosis not present

## 2020-05-04 DIAGNOSIS — K219 Gastro-esophageal reflux disease without esophagitis: Secondary | ICD-10-CM | POA: Diagnosis present

## 2020-05-04 DIAGNOSIS — I509 Heart failure, unspecified: Secondary | ICD-10-CM | POA: Diagnosis present

## 2020-05-04 DIAGNOSIS — I609 Nontraumatic subarachnoid hemorrhage, unspecified: Secondary | ICD-10-CM | POA: Diagnosis not present

## 2020-05-04 DIAGNOSIS — I4510 Unspecified right bundle-branch block: Secondary | ICD-10-CM | POA: Diagnosis not present

## 2020-05-04 DIAGNOSIS — G9349 Other encephalopathy: Secondary | ICD-10-CM | POA: Diagnosis present

## 2020-05-04 DIAGNOSIS — I4819 Other persistent atrial fibrillation: Secondary | ICD-10-CM | POA: Diagnosis not present

## 2020-05-04 DIAGNOSIS — Z9189 Other specified personal risk factors, not elsewhere classified: Secondary | ICD-10-CM

## 2020-05-04 DIAGNOSIS — S069X9S Unspecified intracranial injury with loss of consciousness of unspecified duration, sequela: Secondary | ICD-10-CM | POA: Diagnosis not present

## 2020-05-04 DIAGNOSIS — R4689 Other symptoms and signs involving appearance and behavior: Secondary | ICD-10-CM | POA: Diagnosis not present

## 2020-05-04 DIAGNOSIS — G9340 Encephalopathy, unspecified: Secondary | ICD-10-CM | POA: Diagnosis not present

## 2020-05-04 DIAGNOSIS — Z781 Physical restraint status: Secondary | ICD-10-CM

## 2020-05-04 DIAGNOSIS — R269 Unspecified abnormalities of gait and mobility: Secondary | ICD-10-CM | POA: Diagnosis present

## 2020-05-04 DIAGNOSIS — R561 Post traumatic seizures: Secondary | ICD-10-CM | POA: Diagnosis present

## 2020-05-04 DIAGNOSIS — S065X9A Traumatic subdural hemorrhage with loss of consciousness of unspecified duration, initial encounter: Secondary | ICD-10-CM | POA: Diagnosis present

## 2020-05-04 DIAGNOSIS — R1310 Dysphagia, unspecified: Secondary | ICD-10-CM | POA: Diagnosis present

## 2020-05-04 DIAGNOSIS — R471 Dysarthria and anarthria: Secondary | ICD-10-CM | POA: Diagnosis present

## 2020-05-04 DIAGNOSIS — R4587 Impulsiveness: Secondary | ICD-10-CM | POA: Diagnosis present

## 2020-05-04 DIAGNOSIS — R34 Anuria and oliguria: Secondary | ICD-10-CM | POA: Diagnosis present

## 2020-05-04 DIAGNOSIS — I10 Essential (primary) hypertension: Secondary | ICD-10-CM | POA: Diagnosis present

## 2020-05-04 DIAGNOSIS — J9600 Acute respiratory failure, unspecified whether with hypoxia or hypercapnia: Secondary | ICD-10-CM | POA: Diagnosis present

## 2020-05-04 DIAGNOSIS — W19XXXD Unspecified fall, subsequent encounter: Secondary | ICD-10-CM | POA: Diagnosis present

## 2020-05-04 DIAGNOSIS — G8929 Other chronic pain: Secondary | ICD-10-CM | POA: Diagnosis present

## 2020-05-04 DIAGNOSIS — N19 Unspecified kidney failure: Secondary | ICD-10-CM

## 2020-05-04 DIAGNOSIS — E871 Hypo-osmolality and hyponatremia: Secondary | ICD-10-CM | POA: Diagnosis not present

## 2020-05-04 DIAGNOSIS — I160 Hypertensive urgency: Secondary | ICD-10-CM | POA: Diagnosis not present

## 2020-05-04 DIAGNOSIS — Z79891 Long term (current) use of opiate analgesic: Secondary | ICD-10-CM

## 2020-05-04 DIAGNOSIS — R569 Unspecified convulsions: Secondary | ICD-10-CM | POA: Diagnosis not present

## 2020-05-04 DIAGNOSIS — Z79899 Other long term (current) drug therapy: Secondary | ICD-10-CM

## 2020-05-04 DIAGNOSIS — S066X9D Traumatic subarachnoid hemorrhage with loss of consciousness of unspecified duration, subsequent encounter: Secondary | ICD-10-CM | POA: Diagnosis not present

## 2020-05-04 LAB — DIFFERENTIAL
Abs Immature Granulocytes: 0.09 10*3/uL — ABNORMAL HIGH (ref 0.00–0.07)
Basophils Absolute: 0.1 10*3/uL (ref 0.0–0.1)
Basophils Relative: 0 %
Eosinophils Absolute: 0.1 10*3/uL (ref 0.0–0.5)
Eosinophils Relative: 0 %
Immature Granulocytes: 1 %
Lymphocytes Relative: 11 %
Lymphs Abs: 2 10*3/uL (ref 0.7–4.0)
Monocytes Absolute: 1.1 10*3/uL — ABNORMAL HIGH (ref 0.1–1.0)
Monocytes Relative: 6 %
Neutro Abs: 15.2 10*3/uL — ABNORMAL HIGH (ref 1.7–7.7)
Neutrophils Relative %: 82 %

## 2020-05-04 LAB — URINALYSIS, ROUTINE W REFLEX MICROSCOPIC
Bacteria, UA: NONE SEEN
Bilirubin Urine: NEGATIVE
Glucose, UA: NEGATIVE mg/dL
Ketones, ur: 5 mg/dL — AB
Leukocytes,Ua: NEGATIVE
Nitrite: NEGATIVE
Protein, ur: 100 mg/dL — AB
Specific Gravity, Urine: 1.02 (ref 1.005–1.030)
pH: 5 (ref 5.0–8.0)

## 2020-05-04 LAB — I-STAT ARTERIAL BLOOD GAS, ED
Acid-base deficit: 8 mmol/L — ABNORMAL HIGH (ref 0.0–2.0)
Acid-base deficit: 3 mmol/L — ABNORMAL HIGH (ref 0.0–2.0)
Bicarbonate: 18 mmol/L — ABNORMAL LOW (ref 20.0–28.0)
Bicarbonate: 20 mmol/L (ref 20.0–28.0)
Calcium, Ion: 1.21 mmol/L (ref 1.15–1.40)
Calcium, Ion: 1.12 mmol/L — ABNORMAL LOW (ref 1.15–1.40)
HCT: 47 % (ref 39.0–52.0)
HCT: 43 % (ref 39.0–52.0)
Hemoglobin: 16 g/dL (ref 13.0–17.0)
Hemoglobin: 14.6 g/dL (ref 13.0–17.0)
O2 Saturation: 93 %
O2 Saturation: 99 %
Patient temperature: 98.4
Potassium: 4.1 mmol/L (ref 3.5–5.1)
Potassium: 4.2 mmol/L (ref 3.5–5.1)
Sodium: 138 mmol/L (ref 135–145)
Sodium: 139 mmol/L (ref 135–145)
TCO2: 19 mmol/L — ABNORMAL LOW (ref 22–32)
TCO2: 21 mmol/L — ABNORMAL LOW (ref 22–32)
pCO2 arterial: 37.2 mmHg (ref 32.0–48.0)
pCO2 arterial: 28.4 mmHg — ABNORMAL LOW (ref 32.0–48.0)
pH, Arterial: 7.293 — ABNORMAL LOW (ref 7.350–7.450)
pH, Arterial: 7.456 — ABNORMAL HIGH (ref 7.350–7.450)
pO2, Arterial: 74 mmHg — ABNORMAL LOW (ref 83.0–108.0)
pO2, Arterial: 118 mmHg — ABNORMAL HIGH (ref 83.0–108.0)

## 2020-05-04 LAB — I-STAT CHEM 8, ED
BUN: 35 mg/dL — ABNORMAL HIGH (ref 8–23)
Calcium, Ion: 0.96 mmol/L — ABNORMAL LOW (ref 1.15–1.40)
Chloride: 107 mmol/L (ref 98–111)
Creatinine, Ser: 1 mg/dL (ref 0.61–1.24)
Glucose, Bld: 122 mg/dL — ABNORMAL HIGH (ref 70–99)
HCT: 53 % — ABNORMAL HIGH (ref 39.0–52.0)
Hemoglobin: 18 g/dL — ABNORMAL HIGH (ref 13.0–17.0)
Potassium: 4.1 mmol/L (ref 3.5–5.1)
Sodium: 138 mmol/L (ref 135–145)
TCO2: 24 mmol/L (ref 22–32)

## 2020-05-04 LAB — CBC
HCT: 51.5 % (ref 39.0–52.0)
Hemoglobin: 16.7 g/dL (ref 13.0–17.0)
MCH: 31.3 pg (ref 26.0–34.0)
MCHC: 32.4 g/dL (ref 30.0–36.0)
MCV: 96.4 fL (ref 80.0–100.0)
Platelets: 248 10*3/uL (ref 150–400)
RBC: 5.34 MIL/uL (ref 4.22–5.81)
RDW: 13.5 % (ref 11.5–15.5)
WBC: 18.6 10*3/uL — ABNORMAL HIGH (ref 4.0–10.5)
nRBC: 0 % (ref 0.0–0.2)

## 2020-05-04 LAB — COMPREHENSIVE METABOLIC PANEL
ALT: 29 U/L (ref 0–44)
AST: 30 U/L (ref 15–41)
Albumin: 4.6 g/dL (ref 3.5–5.0)
Alkaline Phosphatase: 43 U/L (ref 38–126)
Anion gap: 13 (ref 5–15)
BUN: 28 mg/dL — ABNORMAL HIGH (ref 8–23)
CO2: 21 mmol/L — ABNORMAL LOW (ref 22–32)
Calcium: 8.7 mg/dL — ABNORMAL LOW (ref 8.9–10.3)
Chloride: 103 mmol/L (ref 98–111)
Creatinine, Ser: 1.07 mg/dL (ref 0.61–1.24)
GFR, Estimated: >60 mL/min (ref >60)
Glucose, Bld: 131 mg/dL — ABNORMAL HIGH (ref 70–99)
Potassium: 4 mmol/L (ref 3.5–5.1)
Sodium: 137 mmol/L (ref 135–145)
Total Bilirubin: 1.3 mg/dL — ABNORMAL HIGH (ref 0.3–1.2)
Total Protein: 7.4 g/dL (ref 6.5–8.1)

## 2020-05-04 LAB — SARS CORONAVIRUS 2 BY RT PCR (HOSPITAL ORDER, PERFORMED IN ~~LOC~~ HOSPITAL LAB): SARS Coronavirus 2: POSITIVE — AB

## 2020-05-04 LAB — CBG MONITORING, ED: Glucose-Capillary: 113 mg/dL — ABNORMAL HIGH (ref 70–99)

## 2020-05-04 LAB — PROTIME-INR
INR: 1.1 (ref 0.8–1.2)
Prothrombin Time: 13.7 seconds (ref 11.4–15.2)

## 2020-05-04 LAB — APTT: aPTT: 27 seconds (ref 24–36)

## 2020-05-04 MED ORDER — CLEVIDIPINE BUTYRATE 0.5 MG/ML IV EMUL
0.0000 mg/h | INTRAVENOUS | Status: DC
  Administered 2020-05-04: 5 mg/h via INTRAVENOUS
  Filled 2020-05-04: qty 50

## 2020-05-04 MED ORDER — FENTANYL CITRATE (PF) 100 MCG/2ML IJ SOLN
12.5000 ug | INTRAMUSCULAR | Status: DC | PRN
Start: 2020-05-04 — End: 2020-05-04

## 2020-05-04 MED ORDER — FLEET ENEMA 7-19 GM/118ML RE ENEM
1.0000 | ENEMA | Freq: Once | RECTAL | Status: DC | PRN
  Filled 2020-05-04: qty 1

## 2020-05-04 MED ORDER — FENTANYL 2500MCG IN NS 250ML (10MCG/ML) PREMIX INFUSION
25.0000 ug/h | INTRAVENOUS | Status: DC
  Administered 2020-05-04: 25 ug/h via INTRAVENOUS
  Administered 2020-05-06: 100 ug/h via INTRAVENOUS
  Filled 2020-05-04 (×2): qty 250

## 2020-05-04 MED ORDER — FENTANYL CITRATE (PF) 100 MCG/2ML IJ SOLN: 25.0000 ug | INTRAMUSCULAR | Status: DC | PRN

## 2020-05-04 MED ORDER — LEVETIRACETAM IN NACL 1000 MG/100ML IV SOLN
1000.0000 mg | Freq: Once | INTRAVENOUS | Status: AC
  Administered 2020-05-04: 1000 mg via INTRAVENOUS

## 2020-05-04 MED ORDER — INSULIN ASPART 100 UNIT/ML ~~LOC~~ SOLN
2.0000 [IU] | SUBCUTANEOUS | Status: DC
  Administered 2020-05-06 – 2020-05-08 (×6): 2 [IU] via SUBCUTANEOUS
  Administered 2020-05-08 (×2): 4 [IU] via SUBCUTANEOUS
  Administered 2020-05-08 – 2020-05-09 (×4): 2 [IU] via SUBCUTANEOUS
  Administered 2020-05-09: 4 [IU] via SUBCUTANEOUS
  Administered 2020-05-09 – 2020-05-10 (×4): 2 [IU] via SUBCUTANEOUS
  Administered 2020-05-10: 4 [IU] via SUBCUTANEOUS
  Administered 2020-05-11: 2 [IU] via SUBCUTANEOUS
  Administered 2020-05-11: 4 [IU] via SUBCUTANEOUS
  Administered 2020-05-11 (×2): 2 [IU] via SUBCUTANEOUS
  Administered 2020-05-11: 3 [IU] via SUBCUTANEOUS
  Administered 2020-05-11 – 2020-05-12 (×3): 2 [IU] via SUBCUTANEOUS

## 2020-05-04 MED ORDER — ACETAMINOPHEN 650 MG RE SUPP
650.0000 mg | Freq: Four times a day (QID) | RECTAL | Status: DC | PRN
  Filled 2020-05-04: qty 1

## 2020-05-04 MED ORDER — IOHEXOL 350 MG/ML SOLN
100.0000 mL | Freq: Once | INTRAVENOUS | Status: AC | PRN
  Administered 2020-05-04: 100 mL via INTRAVENOUS

## 2020-05-04 MED ORDER — SODIUM CHLORIDE 0.9 % IV SOLN: 250.0000 mL | INTRAVENOUS | Status: DC

## 2020-05-04 MED ORDER — ROCURONIUM BROMIDE 50 MG/5ML IV SOLN
INTRAVENOUS | Status: AC | PRN
  Administered 2020-05-04: 100 mg via INTRAVENOUS

## 2020-05-04 MED ORDER — LEVETIRACETAM IN NACL 1000 MG/100ML IV SOLN
1000.0000 mg | Freq: Two times a day (BID) | INTRAVENOUS | Status: DC
  Administered 2020-05-04 – 2020-05-11 (×15): 1000 mg via INTRAVENOUS
  Filled 2020-05-04 (×16): qty 100

## 2020-05-04 MED ORDER — ETOMIDATE 2 MG/ML IV SOLN
INTRAVENOUS | Status: AC | PRN
  Administered 2020-05-04: 30 mg via INTRAVENOUS

## 2020-05-04 MED ORDER — SODIUM CHLORIDE 0.9 % IV SOLN: INTRAVENOUS | Status: DC | PRN

## 2020-05-04 MED ORDER — DOCUSATE SODIUM 50 MG/5ML PO LIQD
100.0000 mg | Freq: Two times a day (BID) | ORAL | Status: DC
  Filled 2020-05-04: qty 10

## 2020-05-04 MED ORDER — FENTANYL CITRATE (PF) 100 MCG/2ML IJ SOLN
25.0000 ug | INTRAMUSCULAR | Status: DC | PRN
  Administered 2020-05-05: 100 ug via INTRAVENOUS
  Administered 2020-05-05: 50 ug via INTRAVENOUS

## 2020-05-04 MED ORDER — PROPOFOL 1000 MG/100ML IV EMUL
INTRAVENOUS | Status: AC | PRN
  Administered 2020-05-04: 50 ug/kg/min via INTRAVENOUS

## 2020-05-04 MED ORDER — FENTANYL CITRATE (PF) 100 MCG/2ML IJ SOLN
25.0000 ug | Freq: Once | INTRAMUSCULAR | Status: AC
  Administered 2020-05-04: 25 ug via INTRAVENOUS
  Filled 2020-05-04: qty 2

## 2020-05-04 MED ORDER — FAMOTIDINE IN NACL 20-0.9 MG/50ML-% IV SOLN
20.0000 mg | Freq: Two times a day (BID) | INTRAVENOUS | Status: DC
  Administered 2020-05-04 – 2020-05-06 (×4): 20 mg via INTRAVENOUS
  Filled 2020-05-04 (×4): qty 50

## 2020-05-04 MED ORDER — SODIUM CHLORIDE 0.9% FLUSH
3.0000 mL | Freq: Two times a day (BID) | INTRAVENOUS | Status: DC
  Administered 2020-05-05 – 2020-05-12 (×12): 3 mL via INTRAVENOUS

## 2020-05-04 MED ORDER — SODIUM CHLORIDE 0.9% FLUSH
3.0000 mL | Freq: Once | INTRAVENOUS | Status: DC
Start: 2020-05-04 — End: 2020-05-12

## 2020-05-04 MED ORDER — SODIUM CHLORIDE 0.9 % IV SOLN: INTRAVENOUS | Status: DC

## 2020-05-04 MED ORDER — PROPOFOL 10 MG/ML IV BOLUS
INTRAVENOUS | Status: AC
  Filled 2020-05-04: qty 20

## 2020-05-04 MED ORDER — LORAZEPAM 2 MG/ML IJ SOLN
INTRAMUSCULAR | Status: AC
  Administered 2020-05-04: 2 mg
  Filled 2020-05-04: qty 1

## 2020-05-04 MED ORDER — PROPOFOL 1000 MG/100ML IV EMUL
0.0000 ug/kg/min | INTRAVENOUS | Status: DC
  Administered 2020-05-04: 40 ug/kg/min via INTRAVENOUS
  Administered 2020-05-05 (×2): 50 ug/kg/min via INTRAVENOUS
  Administered 2020-05-05: 10 ug/kg/min via INTRAVENOUS
  Administered 2020-05-05 (×2): 40 ug/kg/min via INTRAVENOUS
  Administered 2020-05-06 (×2): 30 ug/kg/min via INTRAVENOUS
  Filled 2020-05-04 (×9): qty 100

## 2020-05-04 MED ORDER — POLYETHYLENE GLYCOL 3350 17 G PO PACK
17.0000 g | PACK | Freq: Every day | ORAL | Status: DC
  Administered 2020-05-05 – 2020-05-06 (×2): 17 g
  Filled 2020-05-04 (×2): qty 1

## 2020-05-04 MED ORDER — ACETAMINOPHEN 325 MG PO TABS
650.0000 mg | ORAL_TABLET | Freq: Four times a day (QID) | ORAL | Status: DC | PRN
  Administered 2020-05-09 – 2020-05-12 (×6): 650 mg via ORAL
  Filled 2020-05-04 (×6): qty 2

## 2020-05-04 MED ORDER — BISACODYL 10 MG RE SUPP: 10.0000 mg | Freq: Every day | RECTAL | Status: DC | PRN

## 2020-05-04 MED ORDER — FENTANYL BOLUS VIA INFUSION
25.0000 ug | INTRAVENOUS | Status: DC | PRN
  Administered 2020-05-04 – 2020-05-05 (×2): 25 ug via INTRAVENOUS
  Filled 2020-05-04: qty 25

## 2020-05-04 MED ORDER — PHENYLEPHRINE HCL-NACL 10-0.9 MG/250ML-% IV SOLN: 25.0000 ug/min | INTRAVENOUS | Status: DC

## 2020-05-04 MED ORDER — DOCUSATE SODIUM 50 MG/5ML PO LIQD
100.0000 mg | Freq: Two times a day (BID) | ORAL | Status: DC
  Administered 2020-05-05 – 2020-05-10 (×9): 100 mg
  Filled 2020-05-04 (×11): qty 10

## 2020-05-04 MED ORDER — PROPOFOL 1000 MG/100ML IV EMUL: 5.0000 ug/kg/min | INTRAVENOUS | Status: DC

## 2020-05-04 MED ORDER — SODIUM CHLORIDE 0.9 % IV BOLUS
500.0000 mL | Freq: Once | INTRAVENOUS | Status: AC
  Administered 2020-05-04: 500 mL via INTRAVENOUS

## 2020-05-04 MED ORDER — POLYETHYLENE GLYCOL 3350 17 G PO PACK
17.0000 g | PACK | Freq: Every day | ORAL | Status: DC | PRN
  Administered 2020-05-08: 17 g
  Filled 2020-05-04: qty 1

## 2020-05-04 NOTE — ED Notes (Signed)
Pt with seizure lasting approx 2 minutes. Pt was bagged, jaw thrust maneuver. Snoring respirations after seizure. Nasal trumpet place. Dr. Rubin Payor and wife at bedside. Pt remains full code.

## 2020-05-04 NOTE — H&P (Signed)
Chief Complaint: SDH / SAH  HPI: Timothy Case is a 65 y.o. male with a past medical history of CVA, HTN, HLD, and SOB was brought into the emergency department earlier today for altered mental status aphasia. History is obtained from the patient's wife who is at bedside. Per the patient's wife, the patient reported having headaches last night but otherwise was at his baseline. This morning he was noted to be at his baseline and no longer complaining of headaches. His then spoke to him on the telephone around 11:15 am and reports that he appeared to be acting normal. The wife returned home around 1:00 pm and found the patient down on the floor complaining of headaches. He was wet from head to toe and was surrounded by broken glass from a door. He stated "that is why I layed down on the floor". His left eye was swollen and his wife thought he might have injured his eye. They then went to the eye doctor for evaluation. During that time period, he became aphasic and confused. He was then taken to [facility] for evaluation. A CT scan was obtained and revealed bilateral subdural hematomas and subarachnoid hemorrhage.  On arrival the patient was noted to be significantly confused but was following simple commands and moving all extremities.  During the course in the emergency department, the patient had a seizure lasting approximately 2 minutes.  Following the seizure the patient became postictal and required intubation for airway protection.  He was given Ativan and Keppra.   Past Medical History:  Diagnosis Date  . Chronic lower back pain    "q am; cause I'm overweight" (04/21/2012)  . Essential familial hyperlipidemia   . GERD (gastroesophageal reflux disease)   . Hyperlipidemia   . Hypertension   . Shortness of breath    "lying down; sometimes" (04/21/2012)  . Stroke ([facility]) 04/21/2012   "they are leaning towards mini stroke today; all S/S still gone now except little chest pain" (04/21/2012)    Past  Surgical History:  Procedure Laterality Date  . KNEE ARTHROSCOPY  ~ 2008   "both knees; ~ 6 months apart" (04/21/2012)    No family history on file. Social History:  reports that he has never smoked. He has never used smokeless tobacco. He reports current alcohol use of about 6.0 standard drinks of alcohol per week. He reports that he does not use drugs.  Allergies: No Known Allergies  (Not in a hospital admission)   Results for orders placed or performed during the hospital encounter of 05/04/20 (from the past 48 hour(s))  Protime-INR     Status: None   Collection Time: 05/04/20  3:13 PM  Result Value Ref Range   Prothrombin Time 13.7 11.4 - 15.2 seconds   INR 1.1 0.8 - 1.2    Comment: (NOTE) INR goal varies based on device and disease states. Performed at [facility], [address], [place], [place] [address]   APTT     Status: None   Collection Time: 05/04/20  3:13 PM  Result Value Ref Range   aPTT 27 24 - 36 seconds    Comment: Performed at [facility], [address] [place], [place] [address]  CBC     Status: Abnormal   Collection Time: 05/04/20  3:13 PM  Result Value Ref Range   WBC 18.6 (H) 4.0 - 10.5 K/uL   RBC 5.34 4.22 - 5.81 MIL/uL   Hemoglobin 16.7 13.0 - 17.0 g/dL  HCT 51.5 39.0 - 52.0 %   MCV 96.4 80.0 - 100.0 fL   MCH 31.3 26.0 - 34.0 pg   MCHC 32.4 30.0 - 36.0 g/dL   RDW 44.0 10.2 - 72.5 %   Platelets 248 150 - 400 K/uL   nRBC 0.0 0.0 - 0.2 %    Comment: Performed at [facility], [address] [place], [place] [address]  Differential     Status: Abnormal   Collection Time: 05/04/20  3:13 PM  Result Value Ref Range   Neutrophils Relative % 82 %   Neutro Abs 15.2 (H) 1.7 - 7.7 K/uL   Lymphocytes Relative 11 %   Lymphs Abs 2.0 0.7 - 4.0 K/uL   Monocytes Relative 6 %   Monocytes Absolute 1.1 (H) 0.1 - 1.0 K/uL   Eosinophils Relative 0 %   Eosinophils Absolute 0.1 0.0 - 0.5 K/uL   Basophils Relative 0 %   Basophils Absolute  0.1 0.0 - 0.1 K/uL   Immature Granulocytes 1 %   Abs Immature Granulocytes 0.09 (H) 0.00 - 0.07 K/uL    Comment: Performed at [facility], [address] [place], [place] [address]  Comprehensive metabolic panel     Status: Abnormal   Collection Time: 05/04/20  3:13 PM  Result Value Ref Range   Sodium 137 135 - 145 mmol/L   Potassium 4.0 3.5 - 5.1 mmol/L   Chloride 103 98 - 111 mmol/L   CO2 21 (L) 22 - 32 mmol/L   Glucose, Bld 131 (H) 70 - 99 mg/dL    Comment: Glucose reference range applies only to samples taken after fasting for at least 8 hours.   BUN 28 (H) 8 - 23 mg/dL   Creatinine, Ser 2.59 0.61 - 1.24 mg/dL   Calcium 8.7 (L) 8.9 - 10.3 mg/dL   Total Protein 7.4 6.5 - 8.1 g/dL   Albumin 4.6 3.5 - 5.0 g/dL   AST 30 15 - 41 U/L   ALT 29 0 - 44 U/L   Alkaline Phosphatase 43 38 - 126 U/L   Total Bilirubin 1.3 (H) 0.3 - 1.2 mg/dL   GFR, Estimated >56 >38 mL/min    Comment: (NOTE) Calculated using the CKD-EPI Creatinine Equation (2021)    Anion gap 13 5 - 15    Comment: Performed at [facility], [address] [place], [place] [address]  I-stat chem 8, ED     Status: Abnormal   Collection Time: 05/04/20  4:10 PM  Result Value Ref Range   Sodium 138 135 - 145 mmol/L   Potassium 4.1 3.5 - 5.1 mmol/L   Chloride 107 98 - 111 mmol/L   BUN 35 (H) 8 - 23 mg/dL   Creatinine, Ser 3.29 0.61 - 1.24 mg/dL   Glucose, Bld 518 (H) 70 - 99 mg/dL    Comment: Glucose reference range applies only to samples taken after fasting for at least 8 hours.   Calcium, Ion 0.96 (L) 1.15 - 1.40 mmol/L   TCO2 24 22 - 32 mmol/L   Hemoglobin 18.0 (H) 13.0 - 17.0 g/dL   HCT 84.1 (H) 66.0 - 63.0 %   CT HEAD WO CONTRAST  Result Date: 05/04/2020 CLINICAL DATA:  Patient found down.  Fall.  Headache last night. EXAM: CT HEAD WITHOUT CONTRAST CT MAXILLOFACIAL WITHOUT CONTRAST CT CERVICAL SPINE WITHOUT CONTRAST TECHNIQUE: Multidetector CT imaging of the head, cervical spine, and maxillofacial  structures were performed using the standard protocol  without intravenous contrast. Multiplanar CT image reconstructions of the cervical spine and maxillofacial structures were also generated. COMPARISON:  CT head 04/21/2012. FINDINGS: CT HEAD FINDINGS Brain: Small amount of subarachnoid hemorrhage in the sylvian fissure bilaterally. Mild diffuse subarachnoid hemorrhage including right occipital lobe. Basilar cisterns without significant subarachnoid hemorrhage. Bifrontal hemorrhagic contusions. Small amount of frontal subarachnoid hemorrhage. Small hemorrhagic contusion in the right cerebellum. Small left frontal subdural hematoma. Right subdural hematoma measuring approximately 6 mm in thickness over the convexity. Ventricle size normal.  No midline shift.  No acute infarct or mass. Vascular: No hyperdense vessel. Blood in the sylvian fissure obscures the middle cerebral artery bilaterally. Skull: Negative for skull fracture. Other: None CT MAXILLOFACIAL FINDINGS Osseous: Negative for facial fracture Orbits: No fracture of the orbit. Negative for orbital mass or hematoma. Normal globe. There is soft tissue swelling overlying the left eye Sinuses: Mucosal edema throughout the paranasal sinuses without air-fluid level Soft tissues: Moderate soft tissue swelling overlying the left eye compatible with contusion. CT CERVICAL SPINE FINDINGS Alignment: Mild anterolisthesis C4-5 Skull base and vertebrae: Negative for fracture. Motion degraded study. Soft tissues and spinal canal: Negative Disc levels: Multilevel disc and facet degeneration throughout the cervical spine. Multilevel foraminal stenosis. Mild spinal stenosis C5-6 and C6-7. Upper chest: Not image Other: None IMPRESSION: 1. Bilateral small subdural hematomas without midline shift. Diffuse subarachnoid hemorrhage which is predominately in the sylvian fissures with sparing of the basilar cisterns. There are bifrontal hemorrhagic contusions. There is a small left  cerebellar hemorrhagic contusion. Pattern most consistent with trauma rather than aneurysm rupture. 2. Negative for facial fracture 3. Negative for cervical spine fracture. Electronically Signed   By: Marlan Palau M.D.   On: 05/04/2020 16:08   CT Cervical Spine Wo Contrast  Result Date: 05/04/2020 CLINICAL DATA:  Patient found down.  Fall.  Headache last night. EXAM: CT HEAD WITHOUT CONTRAST CT MAXILLOFACIAL WITHOUT CONTRAST CT CERVICAL SPINE WITHOUT CONTRAST TECHNIQUE: Multidetector CT imaging of the head, cervical spine, and maxillofacial structures were performed using the standard protocol without intravenous contrast. Multiplanar CT image reconstructions of the cervical spine and maxillofacial structures were also generated. COMPARISON:  CT head 04/21/2012. FINDINGS: CT HEAD FINDINGS Brain: Small amount of subarachnoid hemorrhage in the sylvian fissure bilaterally. Mild diffuse subarachnoid hemorrhage including right occipital lobe. Basilar cisterns without significant subarachnoid hemorrhage. Bifrontal hemorrhagic contusions. Small amount of frontal subarachnoid hemorrhage. Small hemorrhagic contusion in the right cerebellum. Small left frontal subdural hematoma. Right subdural hematoma measuring approximately 6 mm in thickness over the convexity. Ventricle size normal.  No midline shift.  No acute infarct or mass. Vascular: No hyperdense vessel. Blood in the sylvian fissure obscures the middle cerebral artery bilaterally. Skull: Negative for skull fracture. Other: None CT MAXILLOFACIAL FINDINGS Osseous: Negative for facial fracture Orbits: No fracture of the orbit. Negative for orbital mass or hematoma. Normal globe. There is soft tissue swelling overlying the left eye Sinuses: Mucosal edema throughout the paranasal sinuses without air-fluid level Soft tissues: Moderate soft tissue swelling overlying the left eye compatible with contusion. CT CERVICAL SPINE FINDINGS Alignment: Mild anterolisthesis C4-5  Skull base and vertebrae: Negative for fracture. Motion degraded study. Soft tissues and spinal canal: Negative Disc levels: Multilevel disc and facet degeneration throughout the cervical spine. Multilevel foraminal stenosis. Mild spinal stenosis C5-6 and C6-7. Upper chest: Not image Other: None IMPRESSION: 1. Bilateral small subdural hematomas without midline shift. Diffuse subarachnoid hemorrhage which is predominately in the sylvian fissures with sparing of the basilar cisterns.  There are bifrontal hemorrhagic contusions. There is a small left cerebellar hemorrhagic contusion. Pattern most consistent with trauma rather than aneurysm rupture. 2. Negative for facial fracture 3. Negative for cervical spine fracture. Electronically Signed   By: Marlan Palauharles  Clark M.D.   On: 05/04/2020 16:08   CT Maxillofacial Wo Contrast  Result Date: 05/04/2020 CLINICAL DATA:  Patient found down.  Fall.  Headache last night. EXAM: CT HEAD WITHOUT CONTRAST CT MAXILLOFACIAL WITHOUT CONTRAST CT CERVICAL SPINE WITHOUT CONTRAST TECHNIQUE: Multidetector CT imaging of the head, cervical spine, and maxillofacial structures were performed using the standard protocol without intravenous contrast. Multiplanar CT image reconstructions of the cervical spine and maxillofacial structures were also generated. COMPARISON:  CT head 04/21/2012. FINDINGS: CT HEAD FINDINGS Brain: Small amount of subarachnoid hemorrhage in the sylvian fissure bilaterally. Mild diffuse subarachnoid hemorrhage including right occipital lobe. Basilar cisterns without significant subarachnoid hemorrhage. Bifrontal hemorrhagic contusions. Small amount of frontal subarachnoid hemorrhage. Small hemorrhagic contusion in the right cerebellum. Small left frontal subdural hematoma. Right subdural hematoma measuring approximately 6 mm in thickness over the convexity. Ventricle size normal.  No midline shift.  No acute infarct or mass. Vascular: No hyperdense vessel. Blood in the  sylvian fissure obscures the middle cerebral artery bilaterally. Skull: Negative for skull fracture. Other: None CT MAXILLOFACIAL FINDINGS Osseous: Negative for facial fracture Orbits: No fracture of the orbit. Negative for orbital mass or hematoma. Normal globe. There is soft tissue swelling overlying the left eye Sinuses: Mucosal edema throughout the paranasal sinuses without air-fluid level Soft tissues: Moderate soft tissue swelling overlying the left eye compatible with contusion. CT CERVICAL SPINE FINDINGS Alignment: Mild anterolisthesis C4-5 Skull base and vertebrae: Negative for fracture. Motion degraded study. Soft tissues and spinal canal: Negative Disc levels: Multilevel disc and facet degeneration throughout the cervical spine. Multilevel foraminal stenosis. Mild spinal stenosis C5-6 and C6-7. Upper chest: Not image Other: None IMPRESSION: 1. Bilateral small subdural hematomas without midline shift. Diffuse subarachnoid hemorrhage which is predominately in the sylvian fissures with sparing of the basilar cisterns. There are bifrontal hemorrhagic contusions. There is a small left cerebellar hemorrhagic contusion. Pattern most consistent with trauma rather than aneurysm rupture. 2. Negative for facial fracture 3. Negative for cervical spine fracture. Electronically Signed   By: Marlan Palauharles  Clark M.D.   On: 05/04/2020 16:08    Review of Systems: Unable to obtain due to patient unresponsiveness   Blood pressure 135/89, pulse (!) 135, temperature 98.4 F (36.9 C), temperature source Oral, resp. rate (!) 24, height 5\' 9"  (1.753 m), weight 106.6 kg, SpO2 94 %.  Physical Exam: Patient is unresponsive, intubated and sedated on propofol. LTM in place. The patient withdrawals from noxious stimuli in his BUE and BLE. No spontaneous eye opening. Pupils 2 mm round, sluggish. Bilateral corneal reflex impaired. Strong cough and gag.   GCS: GCS eye subscore is 1. GCS verbal subscore is 1. GCS motor subscore is  4.  Constitutional:      Appearance: He is well-developed and well-nourished.  HENT:     Head: Normocephalic.     Comments: Patient with hematoma to left upper eyelid.  There is associated subconjunctival hemorrhage of the left eye. Cardiovascular:     Rate and Rhythm: Regular rhythm. Tachycardia present.     Heart sounds: Normal heart sounds.  Pulmonary:     Effort: Pulmonary effort is normal.     Breath sounds: Normal breath sounds.  Abdominal:     Palpations: Abdomen is soft.  Tenderness: There is no abdominal tenderness. There is no guarding or rebound.  Skin:    General: Skin is warm and dry.     Assessment/Plan 65 y.o. male found down earlier today with headaches, confusion, and aphasia. CT scan revealed bilateral subdural hematomas and subarachnoid hemorrhage.  While in the ED, he had a seizure that lasted about 2 minutes and subsequently led to intubation for airway protection.   The patient is not a surgical candidate at this time. STAT CTA. Admit to Neuro ICU for frequent neuro checks as patient is high risk for neurological deterioration. CCM consult for vent/medical management. Maintain SBP 110-140. Patient will remained sedated and intubated overnight. Repeat CT head in the morning.    Council MechanicJoshua L. Aadhav Uhlig, DNP, NP-C 05/04/2020 4:48 PM

## 2020-05-04 NOTE — ED Provider Notes (Addendum)
[facility] EMERGENCY DEPARTMENT Provider Note   CSN: [id number] Arrival date & time: 05/04/20  1419     History No chief complaint on file.   Timothy Case is a 65 y.o. male.  Patient with history of questionable TIA, transient global ischemia several years ago, aspirin daily, no anticoagulation, hypertension --presents to the emergency department for altered mental status and aphasia.  Level 5 caveat due to altered mental status.  Most history obtained from the patient's wife.   Patient reported having a headache behind his eyes last night before he went to bed.  This morning he woke up and was normal.  Wife called the patient and talked with him on the phone at about 11:15 AM today.  He was acting normally.  Patient was outside and noted to walk back into the house on the doorbell camera at approximately 12:30 PM.  Patient was found down on the floor complaining of a headache when she returned shortly after 1 PM.  He was found on the floor, wet, complaining of a headache stating "that is why I layed down on the floor".  There was broken glass from a pane of glass on a door/  She noted that his left upper eye was swollen.  They called his eye doctor and brought him to be seen.  Patient began having worsening difficulty with speech and became more confused.  She states that he told the eye doctor he went out for a walk in the rain and other things that were likely not true, demonstrating confusion.  She brought him to the emergency department.          Past Medical History:  Diagnosis Date  . Chronic lower back pain    "q am; cause I'm overweight" (04/21/2012)  . Essential familial hyperlipidemia   . GERD (gastroesophageal reflux disease)   . Hyperlipidemia   . Hypertension   . Shortness of breath    "lying down; sometimes" (04/21/2012)  . Stroke (HCC) 04/21/2012   "they are leaning towards mini stroke today; all S/S still gone now except little chest  pain" (04/21/2012)    Patient Active Problem List   Diagnosis Date Noted  . Nonallopathic lesion of sacral region 05/06/2017  . Nonallopathic lesion of lumbar region 05/06/2017  . Nonallopathic lesion of thoracic region 05/06/2017  . 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 Acute encephalopathy 04/22/2012  . TIA (transient ischemic attack) 04/22/2012  . HTN (hypertension) 04/21/2012  . Delirium 04/21/2012  . Obesity 04/21/2012  . Hyperlipidemia 04/21/2012  . GERD (gastroesophageal reflux disease) 04/21/2012    Past Surgical History:  Procedure Laterality Date  . KNEE ARTHROSCOPY  ~ 2008   "both knees; ~ 6 months apart" (04/21/2012)       No family history on file.  Social History   Tobacco Use  . Smoking status: Never Smoker  . Smokeless tobacco: Never Used  Substance Use Topics  . Alcohol use: Yes    Alcohol/week: 6.0 standard drinks    Types: 6 Glasses of wine per week    Comment: 04/21/2012 "few glasses of wine a few days in a row; none in a few days; @ the most I'll have is 2 glass of wine/day; average ~ 6 glasses/wk"  . Drug use: No    Home Medications Prior to Admission medications   Medication Sig Start Date End Date Taking? Authorizing Provider  atorvastatin (LIPITOR) 20 MG tablet Take 20 mg by mouth daily.  [provider]  gabapentin (NEURONTIN) 100 MG capsule Take 2 capsules (200 mg total) by mouth at bedtime. Patient not taking: Reported on 06/15/2019 06/17/17   Judi Saa, DO  predniSONE (DELTASONE) 50 MG tablet Take 1 tablet (50 mg total) by mouth daily. Patient not taking: Reported on 06/15/2019 05/06/17   Judi Saa, DO  predniSONE (DELTASONE) 50 MG tablet Take 1 tablet (50 mg total) by mouth daily. Patient not taking: Reported on 06/15/2019 07/08/17   Judi Saa, DO  ramipril (ALTACE) 5 MG capsule Take 10 mg by mouth daily.     [provider]  rosuvastatin (CRESTOR) 20 MG tablet Take by mouth. 03/30/19   [provider]  traMADol (ULTRAM) 50 MG tablet Take 1 tablet (50 mg total) by mouth every 8 (eight) hours as needed. Patient not taking: Reported on 06/15/2019 07/08/17   Judi Saa, DO    Allergies    Patient has no known allergies.  Review of Systems   Review of Systems  Unable to perform ROS: Mental status change    Physical Exam Updated Vital Signs BP (!) 199/117 (BP Location: Right Arm)   Pulse (!) 105   Temp 98.4 F (36.9 C) (Oral)   Resp 16   SpO2 98%   Physical Exam Vitals and nursing note reviewed.  Constitutional:      Appearance: He is well-developed and well-nourished.  HENT:     Head: Normocephalic.     Comments: Patient with hematoma to left upper eyelid.  There is associated subconjunctival hemorrhage of the left eye.    Right Ear: Tympanic membrane, ear canal and external ear normal.     Left Ear: Tympanic membrane, ear canal and external ear normal.     Nose:     Comments: No epistaxis    Mouth/Throat:     Mouth: Mucous membranes are moist.     Comments: No blood in airway, dentition appears intact Eyes:     General:        Right eye: No discharge.        Left eye: No discharge.     Conjunctiva/sclera:     Right eye: No exudate or hemorrhage.    Left eye: Hemorrhage present. No exudate.    Comments: Pupils slightly dilated but reactive bilaterally, symmetric  Cardiovascular:     Rate and Rhythm: Regular rhythm. Tachycardia present.     Heart sounds: Normal heart sounds.  Pulmonary:     Effort: Pulmonary effort is normal.     Breath sounds: Normal breath sounds.  Abdominal:     Palpations: Abdomen is soft.     Tenderness: There is no abdominal tenderness. There is no guarding or rebound.  Musculoskeletal:     Cervical back: Normal range of motion and neck supple.  Skin:    General: Skin is warm and dry.  Neurological:     Mental Status: He is alert.     GCS: GCS eye subscore is 4. GCS verbal subscore is 2. GCS motor subscore is 6.      Cranial Nerves: Dysarthria present. No facial asymmetry.     Motor: Motor function is intact. No seizure activity or pronator drift.     Comments: Patient altered at time of initial exam.  Patient was not able to follow instructions to transfer from chair to exam bed and needed several people to lift and transfer him.  He would hold up his arms, close his eyes, stick out  his tongue, and wiggles his toes when asked.  Patient grossly aphasic.  Tremor of bilateral arms noted.   Psychiatric:        Mood and Affect: Mood and affect normal.     ED Results / Procedures / Treatments   Labs (all labs ordered are listed, but only abnormal results are displayed) Labs Reviewed  CBC - Abnormal; Notable for the following components:      Result Value   WBC 18.6 (*)    All other components within normal limits  DIFFERENTIAL - Abnormal; Notable for the following components:   Neutro Abs 15.2 (*)    Monocytes Absolute 1.1 (*)    Abs Immature Granulocytes 0.09 (*)    All other components within normal limits  COMPREHENSIVE METABOLIC PANEL - Abnormal; Notable for the following components:   CO2 21 (*)    Glucose, Bld 131 (*)    BUN 28 (*)    Calcium 8.7 (*)    Total Bilirubin 1.3 (*)    All other components within normal limits  I-STAT CHEM 8, ED - Abnormal; Notable for the following components:   BUN 35 (*)    Glucose, Bld 122 (*)    Calcium, Ion 0.96 (*)    Hemoglobin 18.0 (*)    HCT 53.0 (*)    All other components within normal limits  SARS CORONAVIRUS 2 BY RT PCR (HOSPITAL ORDER, PERFORMED IN [facility])  URINE CULTURE  PROTIME-INR  APTT  BLOOD GAS, ARTERIAL  URINALYSIS, ROUTINE W REFLEX MICROSCOPIC  CBG MONITORING, ED    EKG EKG Interpretation  Date/Time:  Thursday May 04 2020 15:07:49 EST Ventricular Rate:  118 PR Interval:  180 QRS Duration: 102 QT Interval:  316 QTC Calculation: 442 R Axis:   35 Text Interpretation: Sinus tachycardia Incomplete right  bundle branch block Cannot rule out Inferior infarct , age undetermined Cannot rule out Anterior infarct , age undetermined Abnormal ECG Confirmed by Benjiman CorePickering, Nathan [phone]([id number]) on 05/04/2020 4:01:13 PM   Radiology CT HEAD WO CONTRAST  Result Date: 05/04/2020 CLINICAL DATA:  Patient found down.  Fall.  Headache last night. EXAM: CT HEAD WITHOUT CONTRAST CT MAXILLOFACIAL WITHOUT CONTRAST CT CERVICAL SPINE WITHOUT CONTRAST TECHNIQUE: Multidetector CT imaging of the head, cervical spine, and maxillofacial structures were performed using the standard protocol without intravenous contrast. Multiplanar CT image reconstructions of the cervical spine and maxillofacial structures were also generated. COMPARISON:  CT head 04/21/2012. FINDINGS: CT HEAD FINDINGS Brain: Small amount of subarachnoid hemorrhage in the sylvian fissure bilaterally. Mild diffuse subarachnoid hemorrhage including right occipital lobe. Basilar cisterns without significant subarachnoid hemorrhage. Bifrontal hemorrhagic contusions. Small amount of frontal subarachnoid hemorrhage. Small hemorrhagic contusion in the right cerebellum. Small left frontal subdural hematoma. Right subdural hematoma measuring approximately 6 mm in thickness over the convexity. Ventricle size normal.  No midline shift.  No acute infarct or mass. Vascular: No hyperdense vessel. Blood in the sylvian fissure obscures the middle cerebral artery bilaterally. Skull: Negative for skull fracture. Other: None CT MAXILLOFACIAL FINDINGS Osseous: Negative for facial fracture Orbits: No fracture of the orbit. Negative for orbital mass or hematoma. Normal globe. There is soft tissue swelling overlying the left eye Sinuses: Mucosal edema throughout the paranasal sinuses without air-fluid level Soft tissues: Moderate soft tissue swelling overlying the left eye compatible with contusion. CT CERVICAL SPINE FINDINGS Alignment: Mild anterolisthesis C4-5 Skull base and vertebrae: Negative for  fracture. Motion degraded study. Soft tissues and spinal canal: Negative Disc levels: Multilevel  disc and facet degeneration throughout the cervical spine. Multilevel foraminal stenosis. Mild spinal stenosis C5-6 and C6-7. Upper chest: Not image Other: None IMPRESSION: 1. Bilateral small subdural hematomas without midline shift. Diffuse subarachnoid hemorrhage which is predominately in the sylvian fissures with sparing of the basilar cisterns. There are bifrontal hemorrhagic contusions. There is a small left cerebellar hemorrhagic contusion. Pattern most consistent with trauma rather than aneurysm rupture. 2. Negative for facial fracture 3. Negative for cervical spine fracture. Electronically Signed   By: Marlan Palau M.D.   On: 05/04/2020 16:08   CT Cervical Spine Wo Contrast  Result Date: 05/04/2020 CLINICAL DATA:  Patient found down.  Fall.  Headache last night. EXAM: CT HEAD WITHOUT CONTRAST CT MAXILLOFACIAL WITHOUT CONTRAST CT CERVICAL SPINE WITHOUT CONTRAST TECHNIQUE: Multidetector CT imaging of the head, cervical spine, and maxillofacial structures were performed using the standard protocol without intravenous contrast. Multiplanar CT image reconstructions of the cervical spine and maxillofacial structures were also generated. COMPARISON:  CT head 04/21/2012. FINDINGS: CT HEAD FINDINGS Brain: Small amount of subarachnoid hemorrhage in the sylvian fissure bilaterally. Mild diffuse subarachnoid hemorrhage including right occipital lobe. Basilar cisterns without significant subarachnoid hemorrhage. Bifrontal hemorrhagic contusions. Small amount of frontal subarachnoid hemorrhage. Small hemorrhagic contusion in the right cerebellum. Small left frontal subdural hematoma. Right subdural hematoma measuring approximately 6 mm in thickness over the convexity. Ventricle size normal.  No midline shift.  No acute infarct or mass. Vascular: No hyperdense vessel. Blood in the sylvian fissure obscures the middle  cerebral artery bilaterally. Skull: Negative for skull fracture. Other: None CT MAXILLOFACIAL FINDINGS Osseous: Negative for facial fracture Orbits: No fracture of the orbit. Negative for orbital mass or hematoma. Normal globe. There is soft tissue swelling overlying the left eye Sinuses: Mucosal edema throughout the paranasal sinuses without air-fluid level Soft tissues: Moderate soft tissue swelling overlying the left eye compatible with contusion. CT CERVICAL SPINE FINDINGS Alignment: Mild anterolisthesis C4-5 Skull base and vertebrae: Negative for fracture. Motion degraded study. Soft tissues and spinal canal: Negative Disc levels: Multilevel disc and facet degeneration throughout the cervical spine. Multilevel foraminal stenosis. Mild spinal stenosis C5-6 and C6-7. Upper chest: Not image Other: None IMPRESSION: 1. Bilateral small subdural hematomas without midline shift. Diffuse subarachnoid hemorrhage which is predominately in the sylvian fissures with sparing of the basilar cisterns. There are bifrontal hemorrhagic contusions. There is a small left cerebellar hemorrhagic contusion. Pattern most consistent with trauma rather than aneurysm rupture. 2. Negative for facial fracture 3. Negative for cervical spine fracture. Electronically Signed   By: Marlan Palau M.D.   On: 05/04/2020 16:08   CT Maxillofacial Wo Contrast  Result Date: 05/04/2020 CLINICAL DATA:  Patient found down.  Fall.  Headache last night. EXAM: CT HEAD WITHOUT CONTRAST CT MAXILLOFACIAL WITHOUT CONTRAST CT CERVICAL SPINE WITHOUT CONTRAST TECHNIQUE: Multidetector CT imaging of the head, cervical spine, and maxillofacial structures were performed using the standard protocol without intravenous contrast. Multiplanar CT image reconstructions of the cervical spine and maxillofacial structures were also generated. COMPARISON:  CT head 04/21/2012. FINDINGS: CT HEAD FINDINGS Brain: Small amount of subarachnoid hemorrhage in the sylvian fissure  bilaterally. Mild diffuse subarachnoid hemorrhage including right occipital lobe. Basilar cisterns without significant subarachnoid hemorrhage. Bifrontal hemorrhagic contusions. Small amount of frontal subarachnoid hemorrhage. Small hemorrhagic contusion in the right cerebellum. Small left frontal subdural hematoma. Right subdural hematoma measuring approximately 6 mm in thickness over the convexity. Ventricle size normal.  No midline shift.  No acute infarct or mass. Vascular: No  hyperdense vessel. Blood in the sylvian fissure obscures the middle cerebral artery bilaterally. Skull: Negative for skull fracture. Other: None CT MAXILLOFACIAL FINDINGS Osseous: Negative for facial fracture Orbits: No fracture of the orbit. Negative for orbital mass or hematoma. Normal globe. There is soft tissue swelling overlying the left eye Sinuses: Mucosal edema throughout the paranasal sinuses without air-fluid level Soft tissues: Moderate soft tissue swelling overlying the left eye compatible with contusion. CT CERVICAL SPINE FINDINGS Alignment: Mild anterolisthesis C4-5 Skull base and vertebrae: Negative for fracture. Motion degraded study. Soft tissues and spinal canal: Negative Disc levels: Multilevel disc and facet degeneration throughout the cervical spine. Multilevel foraminal stenosis. Mild spinal stenosis C5-6 and C6-7. Upper chest: Not image Other: None IMPRESSION: 1. Bilateral small subdural hematomas without midline shift. Diffuse subarachnoid hemorrhage which is predominately in the sylvian fissures with sparing of the basilar cisterns. There are bifrontal hemorrhagic contusions. There is a small left cerebellar hemorrhagic contusion. Pattern most consistent with trauma rather than aneurysm rupture. 2. Negative for facial fracture 3. Negative for cervical spine fracture. Electronically Signed   By: Marlan Palauharles  Clark M.D.   On: 05/04/2020 16:08    Procedures Procedure Name: Intubation Date/Time: 05/04/2020 5:15  PM Performed by: Renne CriglerGeiple, Nuh Lipton, PA-C Pre-anesthesia Checklist: Patient identified, Patient being monitored, Emergency Drugs available, Suction available and Timeout performed Oxygen Delivery Method: Non-rebreather mask Preoxygenation: Pre-oxygenation with 100% oxygen Induction Type: Rapid sequence Ventilation: Nasal airway inserted- appropriate to patient size and Mask ventilation without difficulty Laryngoscope Size: Glidescope Tube size: 8.0 mm Number of attempts: 1 Airway Equipment and Method: Video-laryngoscopy Placement Confirmation: ETT inserted through vocal cords under direct vision,  Positive ETCO2,  CO2 detector and Breath sounds checked- equal and bilateral Secured at: 23 cm Tube secured with: ETT holder Dental Injury: Teeth and Oropharynx as per pre-operative assessment  Difficulty Due To: Difficulty was unanticipated      (including critical care time)  Medications Ordered in ED Medications  sodium chloride flush (NS) 0.9 % injection 3 mL (3 mLs Intravenous Not Given 05/04/20 1537)  clevidipine (CLEVIPREX) infusion 0.5 mg/mL (has no administration in time range)  LORazepam (ATIVAN) 2 MG/ML injection (2 mg  Given 05/04/20 1557)  levETIRAcetam (KEPPRA) IVPB 1000 mg/100 mL premix (0 mg Intravenous Stopped 05/04/20 1625)    Followed by  levETIRAcetam (KEPPRA) IVPB 1000 mg/100 mL premix (0 mg Intravenous Stopped 05/04/20 1651)  etomidate (AMIDATE) injection (30 mg Intravenous Given 05/04/20 1620)  rocuronium (ZEMURON) injection (100 mg Intravenous Given 05/04/20 1621)  propofol (DIPRIVAN) 1000 MG/100ML infusion (20 mcg/kg/min  106.6 kg Intravenous Rate/Dose Change 05/04/20 1657)    ED Course  I have reviewed the triage vital signs and the nursing notes.  Pertinent labs & imaging results that were available during my care of the patient were reviewed by me and considered in my medical decision making (see chart for details).  3:27 PM Patient seen briefly upon arrival to the  room.  He is altered.  He has a swollen left upper eyelid.  Pupils are mildly dilated but reactive.  Patient does not speak in response to questions. Patient brought immediately to CT -- protecting airway but altered.   Upon return from CT, patient with seizure.  Dr. Rubin PayorPickering at bedside.  Patient given Ativan and Keppra load.  4:12 PM Spoke with neurology -- agrees appears post-traumatic. Awaiting neurosurgery consult.   4:25 PM Pt intubated by Dr. Lockie Molauratolo and myself. Neuro has seen just prior to intubation.   4:30 PM Discussed with neurosurgery (  [facility]) who will see. Will determine neurosurgery vs medical admit. Will get back to me regarding trauma involvement.   5:12 PM Pt rechecked. Stable. Wife at bedside. Discussed current plan.   5:24 PM Spoke briefly with trauma surgery -- pt with isolated head trauma, no need for trauma surgery to be involved at this point.   CRITICAL CARE Performed by: Renne Crigler PA-C Total critical care time: 65 minutes Critical care time was exclusive of separately billable procedures and treating other patients. Critical care was necessary to treat or prevent imminent or life-threatening deterioration. Critical care was time spent personally by me on the following activities: development of treatment plan with patient and/or surrogate as well as nursing, discussions with consultants, evaluation of patient's response to treatment, examination of patient, obtaining history from patient or surrogate, ordering and performing treatments and interventions, ordering and review of laboratory studies, ordering and review of radiographic studies, pulse oximetry and re-evaluation of patient's condition.     MDM Rules/Calculators/A&P                          Admit.    Final Clinical Impression(s) / ED Diagnoses Final diagnoses:  Subarachnoid hemorrhage following injury, with loss of consciousness, initial encounter [facility])  Contusion of face, initial  encounter  Subconjunctival hemorrhage of left eye    Rx / DC Orders ED Discharge Orders    None       Renne Crigler, PA-C 05/04/20 1716    Renne Crigler, PA-C 05/04/20 1844    Benjiman Core, MD 05/11/20 [phone]

## 2020-05-04 NOTE — ED Triage Notes (Addendum)
Pt from home, wife went to get groceries at 1100 this morning and came back and found him lying on the floor, confused. Pt unable to answer orientation questions appropriately in triage. Broken glass pane on their front door and pt has swelling to L eye. Wife reports pt was complaining of dull headache last night. Was seen by eye doctor this morning and told to come here.

## 2020-05-04 NOTE — Progress Notes (Signed)
NAME:  Timothy Case, MRN:  [id number], DOB:  17-Jan-1956, LOS: 0 ADMISSION DATE:  05/04/2020, CONSULTATION DATE:  05/04/2020 REFERRING MD: Venetia Maxon, CHIEF COMPLAINT:  SAH   HPI/course in hospital  65 year old man presented with history of several days of severe headache. Was intact this morning initially, but wife eventually found him on the ground with facial trauma complaining of headache. Became increasingly aphasic and confused before becoming more obtunded. Was intubated for airway protection.   CT showed small frontal contusions and moderate subarachnoid blood.   Past Medical History   Past Medical History:  Diagnosis Date  . Chronic lower back pain    "q am; cause I'm overweight" (04/21/2012)  . Essential familial hyperlipidemia   . GERD (gastroesophageal reflux disease)   . Hyperlipidemia   . Hypertension   . Shortness of breath    "lying down; sometimes" (04/21/2012)  . Stroke (HCC) 04/21/2012   "they are leaning towards mini stroke today; all S/S still gone now except little chest pain" (04/21/2012)     Past Surgical History:  Procedure Laterality Date  . KNEE ARTHROSCOPY  ~ 2008   "both knees; ~ 6 months apart" (04/21/2012)     Review of Systems:   Review of Systems  Unable to perform ROS: Critical illness    Social History   reports that he has never smoked. He has never used smokeless tobacco. He reports current alcohol use of about 6.0 standard drinks of alcohol per week. He reports that he does not use drugs.   Family History   His family history is not on file.   Allergies No Known Allergies   Home Medications  Prior to Admission medications   Medication Sig Start Date End Date Taking? Authorizing Provider  atorvastatin (LIPITOR) 20 MG tablet Take 20 mg by mouth daily.     [provider]  gabapentin (NEURONTIN) 100 MG capsule Take 2 capsules (200 mg total) by mouth at bedtime. Patient not taking: Reported on 06/15/2019 06/17/17   Judi Saa, DO  predniSONE (DELTASONE) 50 MG tablet Take 1 tablet (50 mg total) by mouth daily. Patient not taking: Reported on 06/15/2019 05/06/17   Judi Saa, DO  predniSONE (DELTASONE) 50 MG tablet Take 1 tablet (50 mg total) by mouth daily. Patient not taking: Reported on 06/15/2019 07/08/17   Judi Saa, DO  ramipril (ALTACE) 5 MG capsule Take 10 mg by mouth daily.     [provider]  rosuvastatin (CRESTOR) 20 MG tablet Take by mouth. 03/30/19   [provider]  traMADol (ULTRAM) 50 MG tablet Take 1 tablet (50 mg total) by mouth every 8 (eight) hours as needed. Patient not taking: Reported on 06/15/2019 07/08/17   Judi Saa, DO     Interim history/subjective:  Hypotension with sedation.   Objective   Blood pressure 116/66, pulse (!) 109, temperature 98.4 F (36.9 C), temperature source Oral, resp. rate (!) 24, height 5\' 9"  (1.753 m), weight 105 kg, SpO2 97 %.    Vent Mode: PRVC FiO2 (%):  [40 %] 40 % Set Rate:  [24 bmp-28 bmp] 28 bmp Vt Set:  [560 mL] 560 mL PEEP:  [5 cmH20] 5 cmH20 Plateau Pressure:  [18 cmH20] 18 cmH20   Intake/Output Summary (Last 24 hours) at 05/04/2020 1903 Last data filed at 05/04/2020 1703 Gross per 24 hour  Intake 200 ml  Output 350 ml  Net -150 ml   Filed Weights   05/04/20 1558  05/04/20 1656  Weight: 106.6 kg 105 kg    Examination: Physical Exam Constitutional:      Appearance: He is obese.  HENT:     Head:     Comments: Frontal ecchymosis Eyes:     Comments: 83mm bilaterally. Left lid swollen  Cardiovascular:     Rate and Rhythm: Normal rate and regular rhythm.     Heart sounds: Normal heart sounds.  Pulmonary:     Effort: Pulmonary effort is normal.  Abdominal:     Palpations: Abdomen is soft.  Genitourinary:    Penis: Normal.   Neurological:     Mental Status: He is unresponsive.     GCS: GCS eye subscore is 1. GCS verbal subscore is 1. GCS motor subscore is 3.     Comments: Flexor response in all 4       Ancillary tests (personally reviewed)  CBC: Recent Labs  Lab 05/04/20 1513 05/04/20 1610 05/04/20 1714  WBC 18.6*  --   --   NEUTROABS 15.2*  --   --   HGB 16.7 18.0* 16.0  HCT 51.5 53.0* 47.0  MCV 96.4  --   --   PLT 248  --   --     Basic Metabolic Panel: Recent Labs  Lab 05/04/20 1513 05/04/20 1610 05/04/20 1714  NA 137 138 138  K 4.0 4.1 4.1  CL 103 107  --   CO2 21*  --   --   GLUCOSE 131* 122*  --   BUN 28* 35*  --   CREATININE 1.07 1.00  --   CALCIUM 8.7*  --   --    GFR: Estimated Creatinine Clearance: 86.7 mL/min (by C-G formula based on SCr of 1 mg/dL). Recent Labs  Lab 05/04/20 1513  WBC 18.6*    Liver Function Tests: Recent Labs  Lab 05/04/20 1513  AST 30  ALT 29  ALKPHOS 43  BILITOT 1.3*  PROT 7.4  ALBUMIN 4.6   No results for input(s): LIPASE, AMYLASE in the last 168 hours. No results for input(s): AMMONIA in the last 168 hours.  ABG    Component Value Date/Time   PHART 7.293 (L) 05/04/2020 1714   PCO2ART 37.2 05/04/2020 1714   PO2ART 74 (L) 05/04/2020 1714   HCO3 18.0 (L) 05/04/2020 1714   TCO2 19 (L) 05/04/2020 1714   ACIDBASEDEF 8.0 (H) 05/04/2020 1714   O2SAT 93.0 05/04/2020 1714     Coagulation Profile: Recent Labs  Lab 05/04/20 1513  INR 1.1    Cardiac Enzymes: No results for input(s): CKTOTAL, CKMB, CKMBINDEX, TROPONINI in the last 168 hours.  HbA1C: Hgb A1c MFr Bld  Date/Time Value Ref Range Status  04/21/2012 06:22 PM 5.9 (H) <5.7 % Final    Comment:    (NOTE)                                                                       According to the ADA Clinical Practice Recommendations for 2011, when HbA1c is used as a screening test:  >=6.5%   Diagnostic of Diabetes Mellitus           (if abnormal result is confirmed) 5.7-6.4%   Increased risk of developing Diabetes Mellitus References:Diagnosis and Classification  of Diabetes Mellitus,Diabetes Care,2011,34(Suppl 1):S62-S69 and Standards of Medical Care in          Diabetes - 2011,Diabetes Care,2011,34 (Suppl 1):S11-S61.    CBG: No results for input(s): GLUCAP in the last 168 hours.   Assessment & Plan:  Critically ill due acute respiratory failure due to acute intracerebral hemorrhage requiring mechanical ventilation SAH and SDH, with frontal contusions of unclear etiology, requiring titration of phenylephrine or clevidipine to maintain BP 110-140  Plan:  Keep sedated Full ventilator support Neurosurgery to advise regarding further management.   Daily Goals Checklist  Pain/Anxiety/Delirium protocol (if indicated): fentanyl and propofol to RASS -2,-3 Neuro vitals: every 1 hours AED's: Keppra VAP protocol (if indicated): bundle in place Respiratory support goals: Full support, follow ABG, target low normal pCO2 Blood pressure target: SBP 110-140 DVT prophylaxis: SCD only Nutrition Status: NPO GI prophylaxis: famotidine Fluid status goals: IV normal saline Urinary catheter: Guide hemodynamic management Central lines: PIV only Glucose control: Phase 1 protocol  Mobility/therapy needs: Bedrest Antibiotic de-escalation: none Home medication reconciliation: on hold. Daily labs: CBC, BMET Code Status: full  Family Communication: Will update in am. Neurosurgery to advise tonight.  Disposition: ICU   Critical care time: 50 min.     Lynnell Catalan, MD [facility] ICU Physician [facility] [place] [facility]  Pager: [phone] Or Epic Secure Chat After hours: [phone].  05/04/2020, 7:03 PM

## 2020-05-04 NOTE — ED Notes (Signed)
Patient transported to CT 

## 2020-05-04 NOTE — Progress Notes (Signed)
No seizures on prelim EEG review, will continue to monitor.   Ritta Slot, MD [facility] [phone]  If 7pm- 7am, please page neurology on call as listed in AMION.

## 2020-05-04 NOTE — Progress Notes (Signed)
LTM started; no initial skin breakdown was seen; nurse and wife educated on event button. Nurse also educated on moving equipment when patient gets a room.

## 2020-05-04 NOTE — Consult Note (Signed)
Neurology Consultation  Reason for Consult: Witnessed seizure  Referring Physician: Felicita Gage, PA  CC: witnessed seizure  History is obtained from: chart review, ER staff  HPI: Timothy Case is a 65 y.o. male with a medical history significant for hyperlipidemia, hypertension, GERD, and a TIA in 2014 not on anticoagulation who presented to [facility] 05/04/20 with AMS. His wife went to go get groceries this morning around 11:00 and when she returned, she found Timothy Case laying on the floor, confused, with left eye edema and broken glass from a window pane on the front door. She initially took him to an eye doctor who advised him to be evaluated in the ED. On ED arrival, a CT was obtained that revealed bilateral subdural hematomas, diffuse subarachnoid hemorrhage, bifrontal hemorrhagic contusions, and a small left cerebellar hemorrhagic contusion with hypertensive blood pressures 199/117 on arrival. His neurologic status continued to decline in the ED and his RN witnessed him have a seizure lasting approximately 2 minutes. Ativan 2 mg IV was given with resolution of seizure followed by snoring respirations. With continued neurologic decline, the decision was made to intubate for airway protection and neurology was consulted to assist with patient seizures.   LKW: 11:00 AM tpa given?: no, SDH/SAH  ROS: Unable to obtain due to altered mental status.   Past Medical History:  Diagnosis Date  . Chronic lower back pain    "q am; cause I'm overweight" (04/21/2012)  . Essential familial hyperlipidemia   . GERD (gastroesophageal reflux disease)   . Hyperlipidemia   . Hypertension   . Shortness of breath    "lying down; sometimes" (04/21/2012)  . Stroke (HCC) 04/21/2012   "they are leaning towards mini stroke today; all S/S still gone now except little chest pain" (04/21/2012)   No family history on file.  Social History:   reports that he has never smoked. He has never used smokeless  tobacco. He reports current alcohol use of about 6.0 standard drinks of alcohol per week. He reports that he does not use drugs.  Medications  Current Facility-Administered Medications:  .  clevidipine (CLEVIPREX) infusion 0.5 mg/mL, 0-21 mg/hr, Intravenous, Continuous, Geiple, Joshua, PA-C .  levETIRAcetam (KEPPRA) IVPB 1000 mg/100 mL premix, 1,000 mg, Intravenous, Once, Last Rate: 400 mL/hr at 05/04/20 1607, 1,000 mg at 05/04/20 1607 **FOLLOWED BY** levETIRAcetam (KEPPRA) IVPB 1000 mg/100 mL premix, 1,000 mg, Intravenous, Once, Benjiman Core, MD .  sodium chloride flush (NS) 0.9 % injection 3 mL, 3 mL, Intravenous, Once, Benjiman Core, MD  Current Outpatient Medications:  .  atorvastatin (LIPITOR) 20 MG tablet, Take 20 mg by mouth daily. , Disp: , Rfl:  .  gabapentin (NEURONTIN) 100 MG capsule, Take 2 capsules (200 mg total) by mouth at bedtime. (Patient not taking: Reported on 06/15/2019), Disp: 60 capsule, Rfl: 3 .  predniSONE (DELTASONE) 50 MG tablet, Take 1 tablet (50 mg total) by mouth daily. (Patient not taking: Reported on 06/15/2019), Disp: 5 tablet, Rfl: 0 .  predniSONE (DELTASONE) 50 MG tablet, Take 1 tablet (50 mg total) by mouth daily. (Patient not taking: Reported on 06/15/2019), Disp: 5 tablet, Rfl: 0 .  ramipril (ALTACE) 5 MG capsule, Take 10 mg by mouth daily. , Disp: , Rfl:  .  rosuvastatin (CRESTOR) 20 MG tablet, Take by mouth., Disp: , Rfl:  .  traMADol (ULTRAM) 50 MG tablet, Take 1 tablet (50 mg total) by mouth every 8 (eight) hours as needed. (Patient not taking: Reported on 06/15/2019), Disp: 30 tablet, Rfl:  0  Current Outpatient Medications  Medication Instructions  . atorvastatin (LIPITOR) 20 mg, Daily  . gabapentin (NEURONTIN) 200 mg, Oral, Daily at bedtime  . predniSONE (DELTASONE) 50 mg, Oral, Daily  . predniSONE (DELTASONE) 50 mg, Oral, Daily  . ramipril (ALTACE) 10 mg, Oral, Daily  . rosuvastatin (CRESTOR) 20 MG tablet Oral  . traMADol (ULTRAM) 50 mg, Oral,  Every 8 hours PRN   Exam: Current vital signs: BP (!) 199/117 (BP Location: Right Arm)   Pulse (!) 105   Temp 98.4 F (36.9 C) (Oral)   Resp 16   Wt 106.6 kg   SpO2 98%   BMI 34.70 kg/m  Vital signs in last 24 hours: Temp:  [98.4 F (36.9 C)] 98.4 F (36.9 C) (01/20 1502) Pulse Rate:  [105] 105 (01/20 1502) Resp:  [16] 16 (01/20 1502) BP: (199)/(117) 199/117 (01/20 1502) SpO2:  [98 %] 98 % (01/20 1502) Weight:  [106.6 kg] 106.6 kg (01/20 1558)  GENERAL: somnolent, laying in bed on NRB s/p witnessed seizure and IV Ativan. Possibly post-ictal versus worsening neurologic decline from traumatic SAH.  Head: Normocephalic EENT: - Edema and contusion of left eye from traumatic fall today without active hemorrhage.  LUNGS - On NRB with snoring respirations and tachypnea s/p witnessed seizure and IV Ativan administration. ED team at bedside preparing to intubate patient for airway protection. CV - Sinus tachycardia on cardiac monitor. ABDOMEN - Soft, nondistended Ext: warm without edema  NEURO:  Mental Status: somnolent, does not follow commands, does not open eyes but grunts to noxious stimuli without further vocalization Speech/Language: Unable to assess due to altered mental status.  Cranial Nerves:  II: PERRL 3 mm/brisk. Unable to assess visual fields.  III, IV, VI: Oculocephalic reflex intact V: Unable to assess facial sensation.  VII: Face appears symmetric resting;  VIII: Unable to assess IX, X: Unable to assess palate rise with phonation, patient only vocalization is a brief grunt with noxious stimuli.   XI, XII: head appears midline, unable to assess tongue protrusion or shoulder shrug due to AMS, patient not following commands.  Motor: patient with low amplitude tremulous type movements of all extremities, withdrawals to noxious stimuli in all four extremities. No spontaneous antigravity movement and unable to formally assess muscle strength due to patient not following  commands. Bulk is normal.  Sensation, coordination, gait- unable to assess due to altered mental status   Labs I have reviewed labs in epic and the results pertinent to this consultation are: CBC    Component Value Date/Time   WBC 18.6 (H) 05/04/2020 1513   RBC 5.34 05/04/2020 1513   HGB 18.0 (H) 05/04/2020 1610   HCT 53.0 (H) 05/04/2020 1610   PLT 248 05/04/2020 1513   MCV 96.4 05/04/2020 1513   MCH 31.3 05/04/2020 1513   MCHC 32.4 05/04/2020 1513   RDW 13.5 05/04/2020 1513   LYMPHSABS 2.0 05/04/2020 1513   MONOABS 1.1 (H) 05/04/2020 1513   EOSABS 0.1 05/04/2020 1513   BASOSABS 0.1 05/04/2020 1513   CMP     Component Value Date/Time   NA 138 05/04/2020 1610   K 4.1 05/04/2020 1610   CL 107 05/04/2020 1610   CO2 21 (L) 05/04/2020 1513   GLUCOSE 122 (H) 05/04/2020 1610   BUN 35 (H) 05/04/2020 1610   CREATININE 1.00 05/04/2020 1610   CALCIUM 8.7 (L) 05/04/2020 1513   PROT 7.4 05/04/2020 1513   ALBUMIN 4.6 05/04/2020 1513   AST 30 05/04/2020 1513  ALT 29 05/04/2020 1513   ALKPHOS 43 05/04/2020 1513   BILITOT 1.3 (H) 05/04/2020 1513   GFRNONAA >60 05/04/2020 1513   GFRAA >90 04/21/2012 1822   Lipid Panel     Component Value Date/Time   CHOL 165 03/20/2012 0922   TRIG 75.0 03/20/2012 0922   HDL 59.70 03/20/2012 0922   CHOLHDL 3 03/20/2012 0922   VLDL 15.0 03/20/2012 0922   LDLCALC 90 03/20/2012 0922    Imaging I have reviewed the images obtained:  CT-scan of the brain: IMPRESSION: 1. Bilateral small subdural hematomas without midline shift. Diffuse subarachnoid hemorrhage which is predominately in the sylvian fissures with sparing of the basilar cisterns. There are bifrontal hemorrhagic contusions. There is a small left cerebellar hemorrhagic contusion. Pattern most consistent with trauma rather than aneurysm rupture. 2. Negative for facial fracture 3. Negative for cervical spine fracture.  Assessment: Mr. Kautzman is a 65 year old male who presents today  to ED s/p unwitnessed fall for increasing confusion, hypertension, and left eye contusion/edema that was found to have bilateral SDHs, diffuse SAH, bifrontal hemorrhagic contusions, and small left cerebellar hemorrhagic contusion. Strongly suspect SAH secondary to trauma; low suspicion for aneurysmal etiology. Patient with progressive neurologic decline since hospital arrival with a 2 minute witnessed seizure s/p 2 mg Ativan was then intubated for airway protection. LTM EEG monitoring ordered for further evaluation of seizures due to patient high risk for seizures.   Recommendations: - LTM EEG - Loaded with 2 g IV Keppra in the ED, maintenance dose- 1,000 mg IV BID - Evaluation by neurosurgery  - Obtain PT/INR  - No anticoagulation medications at this time - Manage hypertension, blood pressure goal per neurosurgery; Clevidipine gtt PRN - defer need for CTA head to nsgy   Pt seen by NP/Neuro along with MD.  Lanae Boast, AGAC-NP [facility] Pager: [phone]  I have seen the patient and reviewed the above note.  I was present for the entire evaluation and management that is represented by this note.  His head CT strongly suggestive of a traumatic bleed with reported seizure in the ED.  Given that he is being intubated, and may be difficult to monitor overnight, I think that continuous EEG for the time being would be reasonable.  I would continue Keppra.  Other plan as above.  Ritta Slot, MD [facility] [phone]  If 7pm- 7am, please page neurology on call as listed in AMION.

## 2020-05-05 ENCOUNTER — Inpatient Hospital Stay (HOSPITAL_COMMUNITY): Payer: Medicare Other

## 2020-05-05 DIAGNOSIS — R569 Unspecified convulsions: Secondary | ICD-10-CM

## 2020-05-05 DIAGNOSIS — I639 Cerebral infarction, unspecified: Secondary | ICD-10-CM

## 2020-05-05 LAB — URINALYSIS, ROUTINE W REFLEX MICROSCOPIC
Bilirubin Urine: NEGATIVE
Glucose, UA: NEGATIVE mg/dL
Ketones, ur: NEGATIVE mg/dL
Leukocytes,Ua: NEGATIVE
Nitrite: NEGATIVE
Protein, ur: 30 mg/dL — AB
Specific Gravity, Urine: 1.031 — ABNORMAL HIGH (ref 1.005–1.030)
pH: 5 (ref 5.0–8.0)

## 2020-05-05 LAB — CBC
HCT: 41.8 % (ref 39.0–52.0)
Hemoglobin: 14.1 g/dL (ref 13.0–17.0)
MCH: 32.3 pg (ref 26.0–34.0)
MCHC: 33.7 g/dL (ref 30.0–36.0)
MCV: 95.7 fL (ref 80.0–100.0)
Platelets: 186 10*3/uL (ref 150–400)
RBC: 4.37 MIL/uL (ref 4.22–5.81)
RDW: 13.9 % (ref 11.5–15.5)
WBC: 10.1 10*3/uL (ref 4.0–10.5)
nRBC: 0 % (ref 0.0–0.2)

## 2020-05-05 LAB — BASIC METABOLIC PANEL
Anion gap: 11 (ref 5–15)
BUN: 22 mg/dL (ref 8–23)
CO2: 20 mmol/L — ABNORMAL LOW (ref 22–32)
Calcium: 8.2 mg/dL — ABNORMAL LOW (ref 8.9–10.3)
Chloride: 107 mmol/L (ref 98–111)
Creatinine, Ser: 0.95 mg/dL (ref 0.61–1.24)
GFR, Estimated: >60 mL/min (ref >60)
Glucose, Bld: 120 mg/dL — ABNORMAL HIGH (ref 70–99)
Potassium: 3.9 mmol/L (ref 3.5–5.1)
Sodium: 138 mmol/L (ref 135–145)

## 2020-05-05 LAB — POCT I-STAT 7, (LYTES, BLD GAS, ICA,H+H)
Acid-base deficit: 2 mmol/L (ref 0.0–2.0)
Bicarbonate: 20 mmol/L (ref 20.0–28.0)
Calcium, Ion: 1.14 mmol/L — ABNORMAL LOW (ref 1.15–1.40)
HCT: 41 % (ref 39.0–52.0)
Hemoglobin: 13.9 g/dL (ref 13.0–17.0)
O2 Saturation: 99 %
Patient temperature: 98.3
Potassium: 3.7 mmol/L (ref 3.5–5.1)
Sodium: 138 mmol/L (ref 135–145)
TCO2: 21 mmol/L — ABNORMAL LOW (ref 22–32)
pCO2 arterial: 26.8 mmHg — ABNORMAL LOW (ref 32.0–48.0)
pH, Arterial: 7.481 — ABNORMAL HIGH (ref 7.350–7.450)
pO2, Arterial: 107 mmHg (ref 83.0–108.0)

## 2020-05-05 LAB — HIV ANTIBODY (ROUTINE TESTING W REFLEX): HIV Screen 4th Generation wRfx: NONREACTIVE

## 2020-05-05 LAB — MRSA PCR SCREENING: MRSA by PCR: NEGATIVE

## 2020-05-05 LAB — GLUCOSE, CAPILLARY
Glucose-Capillary: 119 mg/dL — ABNORMAL HIGH (ref 70–99)
Glucose-Capillary: 92 mg/dL (ref 70–99)
Glucose-Capillary: 93 mg/dL (ref 70–99)
Glucose-Capillary: 108 mg/dL — ABNORMAL HIGH (ref 70–99)
Glucose-Capillary: 104 mg/dL — ABNORMAL HIGH (ref 70–99)

## 2020-05-05 LAB — CK: Total CK: 150 U/L (ref 49–397)

## 2020-05-05 LAB — CBG MONITORING, ED: Glucose-Capillary: 119 mg/dL — ABNORMAL HIGH (ref 70–99)

## 2020-05-05 LAB — MAGNESIUM
Magnesium: 1.9 mg/dL (ref 1.7–2.4)
Magnesium: 1.9 mg/dL (ref 1.7–2.4)

## 2020-05-05 LAB — PHOSPHORUS
Phosphorus: 2.6 mg/dL (ref 2.5–4.6)
Phosphorus: 3.3 mg/dL (ref 2.5–4.6)

## 2020-05-05 LAB — TRIGLYCERIDES: Triglycerides: 109 mg/dL (ref <150)

## 2020-05-05 MED ORDER — DEXMEDETOMIDINE HCL IN NACL 400 MCG/100ML IV SOLN
0.4000 ug/kg/h | INTRAVENOUS | Status: DC
  Administered 2020-05-05: 0.8 ug/kg/h via INTRAVENOUS
  Administered 2020-05-05: 0.6 ug/kg/h via INTRAVENOUS
  Administered 2020-05-05: 0.4 ug/kg/h via INTRAVENOUS
  Administered 2020-05-06: 0.6 ug/kg/h via INTRAVENOUS
  Administered 2020-05-06: 1.2 ug/kg/h via INTRAVENOUS
  Administered 2020-05-06: 0.8 ug/kg/h via INTRAVENOUS
  Administered 2020-05-07: 1 ug/kg/h via INTRAVENOUS
  Administered 2020-05-07 (×3): 1.2 ug/kg/h via INTRAVENOUS
  Administered 2020-05-08: 1.1 ug/kg/h via INTRAVENOUS
  Administered 2020-05-08: 0.4 ug/kg/h via INTRAVENOUS
  Administered 2020-05-08: 0.6 ug/kg/h via INTRAVENOUS
  Administered 2020-05-09 (×2): 0.8 ug/kg/h via INTRAVENOUS
  Filled 2020-05-05 (×16): qty 100

## 2020-05-05 MED ORDER — VITAL HIGH PROTEIN PO LIQD
1000.0000 mL | ORAL | Status: DC
  Administered 2020-05-05: 1000 mL

## 2020-05-05 MED ORDER — CHLORHEXIDINE GLUCONATE 0.12% ORAL RINSE (MEDLINE KIT)
15.0000 mL | Freq: Two times a day (BID) | OROMUCOSAL | Status: DC
  Administered 2020-05-05 – 2020-05-06 (×4): 15 mL via OROMUCOSAL

## 2020-05-05 MED ORDER — CHLORHEXIDINE GLUCONATE CLOTH 2 % EX PADS
6.0000 | MEDICATED_PAD | Freq: Every day | CUTANEOUS | Status: DC
  Administered 2020-05-06 – 2020-05-08 (×2): 6 via TOPICAL

## 2020-05-05 MED ORDER — ORAL CARE MOUTH RINSE
15.0000 mL | OROMUCOSAL | Status: DC
  Administered 2020-05-05 – 2020-05-06 (×14): 15 mL via OROMUCOSAL

## 2020-05-05 MED ORDER — PROSOURCE TF PO LIQD: 45.0000 mL | Freq: Two times a day (BID) | ORAL | Status: DC

## 2020-05-05 MED ORDER — PROSOURCE TF PO LIQD
45.0000 mL | Freq: Three times a day (TID) | ORAL | Status: DC
  Administered 2020-05-05 – 2020-05-06 (×3): 45 mL
  Filled 2020-05-05 (×3): qty 45

## 2020-05-05 MED ORDER — SODIUM CHLORIDE 0.9 % IV BOLUS
1000.0000 mL | Freq: Once | INTRAVENOUS | Status: AC
  Administered 2020-05-05: 1000 mL via INTRAVENOUS

## 2020-05-05 MED ORDER — VITAL HIGH PROTEIN PO LIQD: 1000.0000 mL | ORAL | Status: DC

## 2020-05-05 MED ORDER — CHLORHEXIDINE GLUCONATE CLOTH 2 % EX PADS
6.0000 | MEDICATED_PAD | Freq: Every day | CUTANEOUS | Status: DC
  Administered 2020-05-05 – 2020-05-12 (×8): 6 via TOPICAL

## 2020-05-05 NOTE — Progress Notes (Signed)
RT and RN transported pt from ED to 4N17 without complication. Pt respiratory status remained stable throughout transport. RT gave report to receiving RT.

## 2020-05-05 NOTE — Progress Notes (Signed)
PT Cancellation Note  Patient Details Name: Finbar Nippert MRN: [id number] DOB: [birth year]   Cancelled Treatment:    Reason Eval/Treat Not Completed: Medical issues which prohibited therapy - pt remains intubated and sedated, PT to check back as medically appropriate.   Marye Round, PT Acute Rehabilitation Services Pager [phone]  Office [phone]    Truddie Coco 05/05/2020, 8:53 AM

## 2020-05-05 NOTE — Progress Notes (Signed)
LTM EEG discontinued - no skin breakdown at unhook.   

## 2020-05-05 NOTE — Progress Notes (Signed)
SLP Cancellation Note  Patient Details Name: Timothy Case MRN: [id number] DOB: 10-Apr-1956   Cancelled treatment:       Reason Eval/Treat Not Completed: Patient not medically ready. Will follow for readiness for SLP interventions   Azucena Dart, Riley Nearing 05/05/2020, 7:02 AM

## 2020-05-05 NOTE — Progress Notes (Signed)
LTM maint complete - no skin breakdown under: VB1,Y6,M6

## 2020-05-05 NOTE — Progress Notes (Signed)
No further seizures on EEG.  I would consider this an early posttraumatic seizure, and this may not indicate a need for long-term antiepileptic therapy, but I would favor continuing it given the degree of his hemorrhage at least in the immediate term, for the next month or so.  On exam today, he withdraws minimally bilateral upper extremities, slightly more in bilateral lower extremities, he is sedated which does limit the exam to some degree but he does not open eyes or follow commands.  Continue Keppra 1 g twice daily  Neurology will be available on an as-needed basis moving forward, we can discontinue continuous EEG monitoring.  Please call with further questions or concerns.  Ritta Slot, MD [facility] [phone]  If 7pm- 7am, please page neurology on call as listed in AMION.

## 2020-05-05 NOTE — Progress Notes (Signed)
eLink Physician-Brief Progress Note Patient Name: Timothy Case DOB: 01-09-1956 MRN: [id number]   Date of Service  05/05/2020  HPI/Events of Note  TBI, SDH/SAH Seizure - intubated for airway protection Covid + incidental? , no infx on CXR, ? Unvaccinated Head CTA neg, no sz on EEG, no neuro sx intervention  eICU Interventions  None, ABG & am labs ok     Intervention Category Evaluation Type: New Patient Evaluation  Addalyn Speedy V. Addelynn Batte 05/05/2020, 4:41 AM

## 2020-05-05 NOTE — Progress Notes (Addendum)
Subjective: Patient reports intubated and sedated. No acute events reported overnight.   Objective: Vital signs in last 24 hours: Temp:  [98.3 F (36.8 C)-98.96 F (37.2 C)] 98.96 F (37.2 C) (01/21 0600) Pulse Rate:  [64-135] 68 (01/21 0600) Resp:  [16-29] 28 (01/21 0600) BP: (94-199)/(66-127) 102/70 (01/21 0600) SpO2:  [80 %-100 %] 99 % (01/21 0600) FiO2 (%):  [40 %] 40 % (01/21 0336) Weight:  [105 kg-106.6 kg] 105 kg (01/21 0500)  Intake/Output from previous day: 01/20 0701 - 01/21 0700 In: 200 [IV Piggyback:200] Out: 1300 [Urine:1000; Emesis/NG output:300] Intake/Output this shift: No intake/output data recorded.  Physical Exam: Patient is unresponsive, intubated and sedated on propofol. LTM EEG in place. The patient withdrawals from noxious stimuli in his BUE and BLE. No spontaneous eye opening. Pupils 3 mm round, sluggish. Bilateral corneal reflex impaired. Strong cough and gag.   GCS: GCS eye subscore is 1. GCS verbal subscore is 1. GCS motor subscore is 4.   Lab Results: Recent Labs    05/04/20 1513 05/04/20 1610 05/05/20 0304 05/05/20 0450  WBC 18.6*  --  10.1  --   HGB 16.7   < > 14.1 13.9  HCT 51.5   < > 41.8 41.0  PLT 248  --  186  --    < > = values in this interval not displayed.   BMET Recent Labs    05/04/20 1513 05/04/20 1610 05/04/20 1714 05/05/20 0304 05/05/20 0450  NA 137 138   < > 138 138  K 4.0 4.1   < > 3.9 3.7  CL 103 107  --  107  --   CO2 21*  --   --  20*  --   GLUCOSE 131* 122*  --  120*  --   BUN 28* 35*  --  22  --   CREATININE 1.07 1.00  --  0.95  --   CALCIUM 8.7*  --   --  8.2*  --    < > = values in this interval not displayed.    Studies/Results: CT Angio Head W or Wo Contrast  Result Date: 05/04/2020 CLINICAL DATA:  Patient found down.  Intracranial hemorrhage. EXAM: CT ANGIOGRAPHY HEAD TECHNIQUE: Multidetector CT imaging of the head was performed using the standard protocol during bolus administration of intravenous  contrast. Multiplanar CT image reconstructions and MIPs were obtained to evaluate the vascular anatomy. CONTRAST:  100mL OMNIPAQUE IOHEXOL 350 MG/ML SOLN COMPARISON:  CT head 05/04/2020 FINDINGS: CTA HEAD Anterior circulation: Internal carotid artery widely patent through the skull base. Hypoplastic right A1 segment. Anterior and middle cerebral arteries widely patent without stenosis. Negative for vascular malformation. Posterior circulation: Both vertebral arteries patent to the basilar. Left PICA patent. Right PICA not visualized. Right AICA patent. Basilar widely patent. Superior cerebellar and posterior cerebral arteries patent without stenosis. Fetal origin right posterior cerebral artery. Negative for vascular malformation. Venous sinuses: Limited venous enhancement due to arterial phase scanning. Anatomic variants: None IMPRESSION: Negative CTA head.  No vascular malformation Intracranial hemorrhage described on prior CT head report. Electronically Signed   By: Marlan Palauharles  Clark M.D.   On: 05/04/2020 19:41   CT HEAD WO CONTRAST  Result Date: 05/05/2020 CLINICAL DATA:  Follow-up subdural hematoma EXAM: CT HEAD WITHOUT CONTRAST TECHNIQUE: Contiguous axial images were obtained from the base of the skull through the vertex without intravenous contrast. COMPARISON:  Yesterday FINDINGS: Brain: Expected blooming of hemorrhagic contusions in the bilateral inferior frontal lobes and right superficial cerebellum.  Mild increase versus redistribution of subarachnoid hemorrhage scattered along bilateral convexities. Small subdural hematoma on both sides, measuring up to 7 mm in the right parietal region and 3-4 mm along the posterior left falx. Small volume intraventricular hemorrhage at the occipital horn of the left lateral ventricle. No acute infarct.  No hydrocephalus. Vascular: Negative Skull: Nondisplaced right occipital bone fracture reaching the midline superiorly. Sinuses/Orbits: Fluid and patchy sinus  opacification in the setting of intubation. IMPRESSION: 1. Expected blooming of hemorrhagic contusions in the bilateral inferior frontal lobes and right cerebellum. 2. Traumatic pattern subarachnoid hemorrhage with either increase or diffusion since prior. 3. Slight increase in the right frontal parietal subdural hematoma which measures up to 7 mm in thickness. Minimal left parafalcine subdural hemorrhage. 4. No hydrocephalus. 5. Nondisplaced right occipital bone fracture. Electronically Signed   By: Marnee Spring M.D.   On: 05/05/2020 05:43   CT HEAD WO CONTRAST  Result Date: 05/04/2020 CLINICAL DATA:  Patient found down.  Fall.  Headache last night. EXAM: CT HEAD WITHOUT CONTRAST CT MAXILLOFACIAL WITHOUT CONTRAST CT CERVICAL SPINE WITHOUT CONTRAST TECHNIQUE: Multidetector CT imaging of the head, cervical spine, and maxillofacial structures were performed using the standard protocol without intravenous contrast. Multiplanar CT image reconstructions of the cervical spine and maxillofacial structures were also generated. COMPARISON:  CT head 04/21/2012. FINDINGS: CT HEAD FINDINGS Brain: Small amount of subarachnoid hemorrhage in the sylvian fissure bilaterally. Mild diffuse subarachnoid hemorrhage including right occipital lobe. Basilar cisterns without significant subarachnoid hemorrhage. Bifrontal hemorrhagic contusions. Small amount of frontal subarachnoid hemorrhage. Small hemorrhagic contusion in the right cerebellum. Small left frontal subdural hematoma. Right subdural hematoma measuring approximately 6 mm in thickness over the convexity. Ventricle size normal.  No midline shift.  No acute infarct or mass. Vascular: No hyperdense vessel. Blood in the sylvian fissure obscures the middle cerebral artery bilaterally. Skull: Negative for skull fracture. Other: None CT MAXILLOFACIAL FINDINGS Osseous: Negative for facial fracture Orbits: No fracture of the orbit. Negative for orbital mass or hematoma. Normal  globe. There is soft tissue swelling overlying the left eye Sinuses: Mucosal edema throughout the paranasal sinuses without air-fluid level Soft tissues: Moderate soft tissue swelling overlying the left eye compatible with contusion. CT CERVICAL SPINE FINDINGS Alignment: Mild anterolisthesis C4-5 Skull base and vertebrae: Negative for fracture. Motion degraded study. Soft tissues and spinal canal: Negative Disc levels: Multilevel disc and facet degeneration throughout the cervical spine. Multilevel foraminal stenosis. Mild spinal stenosis C5-6 and C6-7. Upper chest: Not image Other: None IMPRESSION: 1. Bilateral small subdural hematomas without midline shift. Diffuse subarachnoid hemorrhage which is predominately in the sylvian fissures with sparing of the basilar cisterns. There are bifrontal hemorrhagic contusions. There is a small left cerebellar hemorrhagic contusion. Pattern most consistent with trauma rather than aneurysm rupture. 2. Negative for facial fracture 3. Negative for cervical spine fracture. Electronically Signed   By: Marlan Palau M.D.   On: 05/04/2020 16:08   CT Cervical Spine Wo Contrast  Result Date: 05/04/2020 CLINICAL DATA:  Patient found down.  Fall.  Headache last night. EXAM: CT HEAD WITHOUT CONTRAST CT MAXILLOFACIAL WITHOUT CONTRAST CT CERVICAL SPINE WITHOUT CONTRAST TECHNIQUE: Multidetector CT imaging of the head, cervical spine, and maxillofacial structures were performed using the standard protocol without intravenous contrast. Multiplanar CT image reconstructions of the cervical spine and maxillofacial structures were also generated. COMPARISON:  CT head 04/21/2012. FINDINGS: CT HEAD FINDINGS Brain: Small amount of subarachnoid hemorrhage in the sylvian fissure bilaterally. Mild diffuse subarachnoid hemorrhage including right  occipital lobe. Basilar cisterns without significant subarachnoid hemorrhage. Bifrontal hemorrhagic contusions. Small amount of frontal subarachnoid  hemorrhage. Small hemorrhagic contusion in the right cerebellum. Small left frontal subdural hematoma. Right subdural hematoma measuring approximately 6 mm in thickness over the convexity. Ventricle size normal.  No midline shift.  No acute infarct or mass. Vascular: No hyperdense vessel. Blood in the sylvian fissure obscures the middle cerebral artery bilaterally. Skull: Negative for skull fracture. Other: None CT MAXILLOFACIAL FINDINGS Osseous: Negative for facial fracture Orbits: No fracture of the orbit. Negative for orbital mass or hematoma. Normal globe. There is soft tissue swelling overlying the left eye Sinuses: Mucosal edema throughout the paranasal sinuses without air-fluid level Soft tissues: Moderate soft tissue swelling overlying the left eye compatible with contusion. CT CERVICAL SPINE FINDINGS Alignment: Mild anterolisthesis C4-5 Skull base and vertebrae: Negative for fracture. Motion degraded study. Soft tissues and spinal canal: Negative Disc levels: Multilevel disc and facet degeneration throughout the cervical spine. Multilevel foraminal stenosis. Mild spinal stenosis C5-6 and C6-7. Upper chest: Not image Other: None IMPRESSION: 1. Bilateral small subdural hematomas without midline shift. Diffuse subarachnoid hemorrhage which is predominately in the sylvian fissures with sparing of the basilar cisterns. There are bifrontal hemorrhagic contusions. There is a small left cerebellar hemorrhagic contusion. Pattern most consistent with trauma rather than aneurysm rupture. 2. Negative for facial fracture 3. Negative for cervical spine fracture. Electronically Signed   By: Marlan Palau M.D.   On: 05/04/2020 16:08   DG Chest Portable 1 View  Result Date: 05/04/2020 CLINICAL DATA:  Intubated EXAM: PORTABLE CHEST 1 VIEW COMPARISON:  04/21/2012 FINDINGS: Endotracheal tube tip about 2.4 cm superior to carina. Esophageal tube tip positioned beneath the medial left diaphragm over the gastric cardia. Mild  cardiomegaly. Mild atelectasis left base. No pneumothorax. IMPRESSION: Endotracheal tube tip about 2.4 cm superior to carina. Esophageal tube tip positioned beneath the medial left diaphragm over the gastric cardia. Mild cardiomegaly with subsegmental atelectasis left base. Electronically Signed   By: Jasmine Pang M.D.   On: 05/04/2020 16:55   CT Maxillofacial Wo Contrast  Result Date: 05/04/2020 CLINICAL DATA:  Patient found down.  Fall.  Headache last night. EXAM: CT HEAD WITHOUT CONTRAST CT MAXILLOFACIAL WITHOUT CONTRAST CT CERVICAL SPINE WITHOUT CONTRAST TECHNIQUE: Multidetector CT imaging of the head, cervical spine, and maxillofacial structures were performed using the standard protocol without intravenous contrast. Multiplanar CT image reconstructions of the cervical spine and maxillofacial structures were also generated. COMPARISON:  CT head 04/21/2012. FINDINGS: CT HEAD FINDINGS Brain: Small amount of subarachnoid hemorrhage in the sylvian fissure bilaterally. Mild diffuse subarachnoid hemorrhage including right occipital lobe. Basilar cisterns without significant subarachnoid hemorrhage. Bifrontal hemorrhagic contusions. Small amount of frontal subarachnoid hemorrhage. Small hemorrhagic contusion in the right cerebellum. Small left frontal subdural hematoma. Right subdural hematoma measuring approximately 6 mm in thickness over the convexity. Ventricle size normal.  No midline shift.  No acute infarct or mass. Vascular: No hyperdense vessel. Blood in the sylvian fissure obscures the middle cerebral artery bilaterally. Skull: Negative for skull fracture. Other: None CT MAXILLOFACIAL FINDINGS Osseous: Negative for facial fracture Orbits: No fracture of the orbit. Negative for orbital mass or hematoma. Normal globe. There is soft tissue swelling overlying the left eye Sinuses: Mucosal edema throughout the paranasal sinuses without air-fluid level Soft tissues: Moderate soft tissue swelling overlying the  left eye compatible with contusion. CT CERVICAL SPINE FINDINGS Alignment: Mild anterolisthesis C4-5 Skull base and vertebrae: Negative for fracture. Motion degraded study. Soft tissues  and spinal canal: Negative Disc levels: Multilevel disc and facet degeneration throughout the cervical spine. Multilevel foraminal stenosis. Mild spinal stenosis C5-6 and C6-7. Upper chest: Not image Other: None IMPRESSION: 1. Bilateral small subdural hematomas without midline shift. Diffuse subarachnoid hemorrhage which is predominately in the sylvian fissures with sparing of the basilar cisterns. There are bifrontal hemorrhagic contusions. There is a small left cerebellar hemorrhagic contusion. Pattern most consistent with trauma rather than aneurysm rupture. 2. Negative for facial fracture 3. Negative for cervical spine fracture. Electronically Signed   By: Marlan Palau M.D.   On: 05/04/2020 16:08    Assessment/Plan: CTA head was unremarkable. EEG with no seizure activity overnight.  Follow up CT head this morning showed  expected progression of hemorrhagic contusions in the bilateral inferior frontal lobes and right cerebellum.  Subarachnoid hemorrhage likely of traumatic origin, shows slight increase. Right frontal parietal subdural hematoma with slight increase. Minimal left parafalcine subdural hemorrhage appears stable. Nondisplaced right occipital bone fracture.   Maintain SBP <140. Assess readiness for spontaneous breathing trials. Work with therapies once medically ready. Continue supportive care.     LOS: 1 day     Council [employer]ic, DNP, NP-C 05/05/2020, 7:38 AM   Expected worsening of frontal contusions with small cerebellar contusion.  Interval improvement in [facility].  SDH slightly worse, but not significant mass effect from these.    On exam, patient is sedated.  Pupils are equal and reactive.  He is not following commands.  He does withdraw both upper and lower extremities to noxious stimulation.  He is  undergoing EEG monitoring.  I reviewed case with Dr. Conchita Paris, who agrees that this is likely post-traumatic hemorrhage and there is no need for cerebral angiography.    I reviewed sitaution, scans, and plan with patient's wife, Jasmine December.  We also discussed Covid situation.  She states he had Covid in March, 2021, received vaccinations and booster, then had Covid again in December.  He tested negative two weeks ago with home Antigen test and she is surprised he is now positive with PCR.

## 2020-05-05 NOTE — Progress Notes (Signed)
Initial Nutrition Assessment  DOCUMENTATION CODES:   Obesity unspecified  INTERVENTION:   Initiate tube feeding via OG tube: - Vital High Protein @ 50 ml/hr (1200 ml/day) - ProSource TF 45 ml TID  Tube feeding regimen provides 1320 kcal, 138 grams of protein, and 1003 ml of H2O.   Tube feeding regimen and current propofol provides 1985 total kcal.  NUTRITION DIAGNOSIS:   Inadequate oral intake related to acute illness as evidenced by NPO status.  GOAL:   Patient will meet greater than or equal to 90% of their needs  MONITOR:   Vent status,Labs,Weight trends,TF tolerance  REASON FOR ASSESSMENT:   Ventilator,Consult Enteral/tube feeding initiation and management  ASSESSMENT:   65 year old male who presented to the ED on 1/20 with AMS and after an unwitnessed fall. PMH of HLD, HTN, GERD, TIA in 2014. CT scan revealing bilateral SDH, diffuse SAH, bifrontal hemorrhagic contusions, and a small left cerebellar hemorrhagic contusion. Pt had a witnessed seizure in the ED and was intubated for airway protection. Pt also tested positive for COVID-19.   RD consulted for tube feeding intiation and management. OG tube in place with tip over the gastric cardia per chest x-ray reading from 1/20.  Unable to obtain diet and weight history from pt at this time. Reviewed weight history in chart. Current weight of 105 kg is consistent with most recent available weight of 102.1 kg from 06/15/19. No weight loss noted at this time.   Per chart review, pt originally had COVID-19 in March 2021 then again in December 2021. Pt tested positive for COVID-19 on admission.  Patient is currently intubated on ventilator support MV: 14.7 L/min Temp (24hrs), Avg:98.7 F (37.1 C), Min:98.3 F (36.8 C), Max:98.96 F (37.2 C) BP (cuff): 115/75 MAP (cuff): 89  Drips: Propofol: 25.2 ml/hr (provides 665 kcal daily from lipid) Fentanyl Precedex NS: 100 ml/hr  Medications reviewed and include: colace,  SSI q 4 hours, miralax, IV pepcid, IV keppra  Labs reviewed: ionized calcium 1.14 CBG's: 113-119 x 24 hours  UOP: 1000 ml x 24 hours OG tube output: 300 ml x 24 hours I/O's: +242 ml since admit  NUTRITION - FOCUSED PHYSICAL EXAM:  Unable to complete at this time. RD working remotely.  Diet Order:   Diet Order            Diet NPO time specified  Diet effective now                 EDUCATION NEEDS:   No education needs have been identified at this time  Skin:  Skin Assessment: Reviewed RN Assessment  Last BM:  no documented BM  Height:   Ht Readings from Last 1 Encounters:  05/04/20 5\' 9"  (1.753 m)    Weight:   Wt Readings from Last 1 Encounters:  05/05/20 105 kg    Ideal Body Weight:  72.7 kg   Adjusted Body Weight: 86 kg  BMI:  Body mass index is 34.18 kg/m.  Estimated Nutritional Needs:   Kcal:  1720-1950  Protein:  130-150 grams  Fluid:  >/= 1.8 L    05/07/20, MS, RD, LDN Inpatient Clinical Dietitian Please see [facility] for contact information.

## 2020-05-05 NOTE — Progress Notes (Addendum)
1300 Discussed with R. Agarwala MD about decreased UOP, bladder scan 60ml; orders placed.   1430 HR dropped to 50s (from 60s) after precedex started; MD R. Agarwala made aware.

## 2020-05-05 NOTE — Progress Notes (Addendum)
NAME:  Timothy Case, MRN:  [id number], DOB:  June 18, 1955, LOS: 1 ADMISSION DATE:  05/04/2020, CONSULTATION DATE:  05/04/2020 REFERRING MD: Venetia Maxon, CHIEF COMPLAINT:  SAH   HPI/course in hospital  65 year old man presented with history of several days of severe headache. Was intact this morning initially, but wife eventually found him on the ground with facial trauma complaining of headache. Became increasingly aphasic and confused before becoming more obtunded. Was intubated for airway protection.   CT showed small frontal contusions and moderate subarachnoid blood.   05/05/2019 - CTA no aneurysm. 05/06/2019 - Repeat CT head shows improved   Past Medical History   Past Medical History:  Diagnosis Date  . Chronic lower back pain    "q am; cause I'm overweight" (04/21/2012)  . Essential familial hyperlipidemia   . GERD (gastroesophageal reflux disease)   . Hyperlipidemia   . Hypertension   . Shortness of breath    "lying down; sometimes" (04/21/2012)  . Stroke (HCC) 04/21/2012   "they are leaning towards mini stroke today; all S/S still gone now except little chest pain" (04/21/2012)     Past Surgical History:  Procedure Laterality Date  . KNEE ARTHROSCOPY  ~ 2008   "both knees; ~ 6 months apart" (04/21/2012)    Social History   reports that he has never smoked. He has never used smokeless tobacco. He reports current alcohol use of about 6.0 standard drinks of alcohol per week. He reports that he does not use drugs.   Family History   His family history is not on file.   Allergies No Known Allergies    Interim history/subjective:  CTA shows no visible source of bleeding. CT this am shows improvement.  Agitated when sedation weaned.  Poor urine output.  Objective   Blood pressure 110/72, pulse 68, temperature 98.96 F (37.2 C), resp. rate 20, height 5\' 9"  (1.753 m), weight 105 kg, SpO2 100 %.    Vent Mode: PRVC FiO2 (%):  [40 %] 40 % Set Rate:  [20 bmp-28 bmp] 20  bmp Vt Set:  [560 mL] 560 mL PEEP:  [5 cmH20] 5 cmH20 Plateau Pressure:  [16 cmH20-18 cmH20] 16 cmH20   Intake/Output Summary (Last 24 hours) at 05/05/2020 1256 Last data filed at 05/05/2020 1200 Gross per 24 hour  Intake 1994.43 ml  Output 1575 ml  Net 419.43 ml   Filed Weights   05/04/20 1558 05/04/20 1656 05/05/20 0500  Weight: 106.6 kg 105 kg 105 kg    Examination: Physical Exam Constitutional:      Appearance: He is obese.  HENT:     Head:     Comments: Frontal ecchymosis Eyes:     Comments: 39mm bilaterally. Left lid swollen  Cardiovascular:     Rate and Rhythm: Normal rate and regular rhythm.     Heart sounds: Normal heart sounds.  Pulmonary:     Effort: Pulmonary effort is normal.  Mechanically ventilated.  Overbreathing ventilator. Abdominal:     Palpations: Abdomen is soft.  Genitourinary:    Penis: Normal.   Neurological:     Mental Status: He is unresponsive. Sedated.     GCS: GCS eye subscore is 1. GCS verbal subscore is 1. GCS motor subscore is 3.     Comments: Flexor response in all 4   Ancillary tests (personally reviewed)  CBC: Recent Labs  Lab 05/04/20 1513 05/04/20 1610 05/04/20 1714 05/04/20 2051 05/05/20 0304 05/05/20 0450  WBC 18.6*  --   --   --  10.1  --   NEUTROABS 15.2*  --   --   --   --   --   HGB 16.7 18.0* 16.0 14.6 14.1 13.9  HCT 51.5 53.0* 47.0 43.0 41.8 41.0  MCV 96.4  --   --   --  95.7  --   PLT 248  --   --   --  186  --     Basic Metabolic Panel: Recent Labs  Lab 05/04/20 1513 05/04/20 1610 05/04/20 1714 05/04/20 2051 05/05/20 0304 05/05/20 0450  NA 137 138 138 139 138 138  K 4.0 4.1 4.1 4.2 3.9 3.7  CL 103 107  --   --  107  --   CO2 21*  --   --   --  20*  --   GLUCOSE 131* 122*  --   --  120*  --   BUN 28* 35*  --   --  22  --   CREATININE 1.07 1.00  --   --  0.95  --   CALCIUM 8.7*  --   --   --  8.2*  --   MG  --   --   --   --  1.9  --   PHOS  --   --   --   --  2.6  --    GFR: Estimated Creatinine  Clearance: 91.3 mL/min (by C-G formula based on SCr of 0.95 mg/dL). Recent Labs  Lab 05/04/20 1513 05/05/20 0304  WBC 18.6* 10.1    Liver Function Tests: Recent Labs  Lab 05/04/20 1513  AST 30  ALT 29  ALKPHOS 43  BILITOT 1.3*  PROT 7.4  ALBUMIN 4.6   No results for input(s): LIPASE, AMYLASE in the last 168 hours. No results for input(s): AMMONIA in the last 168 hours.  ABG    Component Value Date/Time   PHART 7.481 (H) 05/05/2020 0450   PCO2ART 26.8 (L) 05/05/2020 0450   PO2ART 107 05/05/2020 0450   HCO3 20.0 05/05/2020 0450   TCO2 21 (L) 05/05/2020 0450   ACIDBASEDEF 2.0 05/05/2020 0450   O2SAT 99.0 05/05/2020 0450     Coagulation Profile: Recent Labs  Lab 05/04/20 1513  INR 1.1    Cardiac Enzymes: No results for input(s): CKTOTAL, CKMB, CKMBINDEX, TROPONINI in the last 168 hours.  HbA1C: Hgb A1c MFr Bld  Date/Time Value Ref Range Status  04/21/2012 06:22 PM 5.9 (H) <5.7 % Final    Comment:    (NOTE)                                                                       According to the ADA Clinical Practice Recommendations for 2011, when HbA1c is used as a screening test:  >=6.5%   Diagnostic of Diabetes Mellitus           (if abnormal result is confirmed) 5.7-6.4%   Increased risk of developing Diabetes Mellitus References:Diagnosis and Classification of Diabetes Mellitus,Diabetes Care,2011,34(Suppl 1):S62-S69 and Standards of Medical Care in         Diabetes - 2011,Diabetes Care,2011,34 (Suppl 1):S11-S61.    CBG: Recent Labs  Lab 05/04/20 2055 05/05/20 0022 05/05/20 0542 05/05/20 0834 05/05/20 1205  GLUCAP 113* 119* 119*  92 93     Assessment & Plan:  Critically ill due acute respiratory failure due to acute intracerebral hemorrhage requiring mechanical ventilation SAH and SDH, with frontal contusions of unclear etiology, requiring titration of phenylephrine or clevidipine to maintain BP 110-140. Etiology appears to be traumatic.   Oliguria  Plan:  Wean sedation.  Continue full ventilatory support.  Continue Keppra as patient had seizure in the field Fluid bolus, urinalysis, CK level, follow electrolytes  Daily Goals Checklist  Pain/Anxiety/Delirium protocol (if indicated): fentanyl and propofol to RASS -2,-3 Neuro vitals: every 1 hours AED's: Keppra VAP protocol (if indicated): bundle in place Respiratory support goals: Use respiratory rate, full ventilatory support all patient overbreathing ventilator Blood pressure target: SBP 110-140 DVT prophylaxis: SCD only, start chemical prophylaxis the next 48 hours Nutrition Status: Start enteral nutrition. GI prophylaxis: famotidine Fluid status goals: IV normal saline Urinary catheter: Guide hemodynamic management Central lines: PIV only Glucose control: Phase 1 protocol  Mobility/therapy needs: Bedrest Antibiotic de-escalation: none Home medication reconciliation: on hold. Daily labs: CBC, BMET Code Status: full  Family Communication: Wife and son updated today at the bedside.  Disposition: ICU   Critical care time: 40 min.     Lynnell Catalan, MD [facility] ICU Physician [facility] [place] [facility]  Pager: [phone] Or Epic Secure Chat After hours: [phone].  05/05/2020, 12:56 PM

## 2020-05-05 NOTE — Procedures (Signed)
Patient Name: Timothy Case  MRN: [id number]  Epilepsy Attending: Charlsie Quest  Referring Physician/Provider: Dr Ritta Slot Duration: 05/04/2020 1639 to 05/05/2020 1625  Patient history: 65 year old male who presents today to ED s/p unwitnessed fall for increasing confusion, hypertension, and left eye contusion/edema that was found to have bilateral SDHs, diffuse SAH, bifrontal hemorrhagic contusions, and small left cerebellar hemorrhagic contusion. Patient with progressive neurologic decline since hospital arrival with a 2 minute witnessed seizure s/p 2 mg Ativan was then intubated for airway protection.  EEG to evaluate for seizures.  Level of alertness:  comatose  AEDs during EEG study: Keppra, propofol  Technical aspects: This EEG study was done with scalp electrodes positioned according to the 10-20 International system of electrode placement. Electrical activity was acquired at a sampling rate of 500Hz  and reviewed with a high frequency filter of 70Hz  and a low frequency filter of 1Hz . EEG data were recorded continuously and digitally stored.   Description: EEG showed continuous generalized 3 to 5 Hz theta-delta slowing admixed with 15 to 18 Hz beta activity in frontocentral region.  Hyperventilation and photic stimulation were not performed.     ABNORMALITY -Continuous slow, generalized  IMPRESSION: This study is suggestive of moderate diffuse encephalopathy, nonspecific etiology but likely related to sedation. No seizures or epileptiform discharges were seen throughout the recording.  Cutter Passey 

## 2020-05-05 NOTE — Progress Notes (Signed)
OT Cancellation Note  Patient Details Name: Timothy Case MRN: [id number] DOB: 1956-03-30   Cancelled Treatment:    Reason Eval/Treat Not Completed: Medical issues which prohibited therapy. Pt on vent and sedated, will hold for today and continue to follow.  Ignacia Palma, OTR/L Acute Rehab Services Pager [phone] Office [phone]     Evette Georges 05/05/2020, 7:48 AM

## 2020-05-05 NOTE — Progress Notes (Signed)
Assisted tele visit to patient with son,Nicholas.  Vena Austria, RN

## 2020-05-05 NOTE — Progress Notes (Signed)
Pt transported to and from CT without event.  

## 2020-05-06 ENCOUNTER — Inpatient Hospital Stay (HOSPITAL_COMMUNITY): Payer: Medicare Other

## 2020-05-06 DIAGNOSIS — I639 Cerebral infarction, unspecified: Secondary | ICD-10-CM | POA: Diagnosis not present

## 2020-05-06 LAB — URINE CULTURE: Culture: NO GROWTH

## 2020-05-06 LAB — CBC WITH DIFFERENTIAL/PLATELET
Abs Immature Granulocytes: 0.02 10*3/uL (ref 0.00–0.07)
Basophils Absolute: 0 10*3/uL (ref 0.0–0.1)
Basophils Relative: 0 %
Eosinophils Absolute: 0.1 10*3/uL (ref 0.0–0.5)
Eosinophils Relative: 2 %
HCT: 40.1 % (ref 39.0–52.0)
Hemoglobin: 13.7 g/dL (ref 13.0–17.0)
Immature Granulocytes: 0 %
Lymphocytes Relative: 13 %
Lymphs Abs: 1 10*3/uL (ref 0.7–4.0)
MCH: 32.4 pg (ref 26.0–34.0)
MCHC: 34.2 g/dL (ref 30.0–36.0)
MCV: 94.8 fL (ref 80.0–100.0)
Monocytes Absolute: 0.8 10*3/uL (ref 0.1–1.0)
Monocytes Relative: 11 %
Neutro Abs: 5.4 10*3/uL (ref 1.7–7.7)
Neutrophils Relative %: 74 %
Platelets: 156 10*3/uL (ref 150–400)
RBC: 4.23 MIL/uL (ref 4.22–5.81)
RDW: 13.8 % (ref 11.5–15.5)
WBC: 7.3 10*3/uL (ref 4.0–10.5)
nRBC: 0 % (ref 0.0–0.2)

## 2020-05-06 LAB — GLUCOSE, CAPILLARY
Glucose-Capillary: 124 mg/dL — ABNORMAL HIGH (ref 70–99)
Glucose-Capillary: 137 mg/dL — ABNORMAL HIGH (ref 70–99)
Glucose-Capillary: 147 mg/dL — ABNORMAL HIGH (ref 70–99)
Glucose-Capillary: 148 mg/dL — ABNORMAL HIGH (ref 70–99)
Glucose-Capillary: 148 mg/dL — ABNORMAL HIGH (ref 70–99)
Glucose-Capillary: 97 mg/dL (ref 70–99)

## 2020-05-06 LAB — BASIC METABOLIC PANEL
Anion gap: 9 (ref 5–15)
BUN: 15 mg/dL (ref 8–23)
CO2: 20 mmol/L — ABNORMAL LOW (ref 22–32)
Calcium: 7.9 mg/dL — ABNORMAL LOW (ref 8.9–10.3)
Chloride: 110 mmol/L (ref 98–111)
Creatinine, Ser: 0.81 mg/dL (ref 0.61–1.24)
GFR, Estimated: >60 mL/min (ref >60)
Glucose, Bld: 104 mg/dL — ABNORMAL HIGH (ref 70–99)
Potassium: 3.9 mmol/L (ref 3.5–5.1)
Sodium: 139 mmol/L (ref 135–145)

## 2020-05-06 LAB — PHOSPHORUS
Phosphorus: 3.3 mg/dL (ref 2.5–4.6)
Phosphorus: 2.1 mg/dL — ABNORMAL LOW (ref 2.5–4.6)

## 2020-05-06 LAB — MAGNESIUM
Magnesium: 1.8 mg/dL (ref 1.7–2.4)
Magnesium: 1.6 mg/dL — ABNORMAL LOW (ref 1.7–2.4)

## 2020-05-06 MED ORDER — LORAZEPAM 2 MG/ML IJ SOLN
2.0000 mg | INTRAMUSCULAR | Status: DC | PRN
  Administered 2020-05-06 – 2020-05-07 (×4): 2 mg via INTRAVENOUS
  Filled 2020-05-06 (×4): qty 1

## 2020-05-06 MED ORDER — ORAL CARE MOUTH RINSE: 15.0000 mL | Freq: Two times a day (BID) | OROMUCOSAL | Status: DC

## 2020-05-06 MED ORDER — FENTANYL CITRATE (PF) 100 MCG/2ML IJ SOLN
25.0000 ug | INTRAMUSCULAR | Status: DC | PRN
Start: 2020-05-06 — End: 2020-05-07

## 2020-05-06 MED ORDER — ORAL CARE MOUTH RINSE
15.0000 mL | Freq: Two times a day (BID) | OROMUCOSAL | Status: DC
  Administered 2020-05-07 – 2020-05-12 (×9): 15 mL via OROMUCOSAL

## 2020-05-06 MED ORDER — HEPARIN SODIUM (PORCINE) 5000 UNIT/ML IJ SOLN
5000.0000 [IU] | Freq: Three times a day (TID) | INTRAMUSCULAR | Status: DC
  Administered 2020-05-06 – 2020-05-08 (×6): 5000 [IU] via SUBCUTANEOUS
  Filled 2020-05-06 (×6): qty 1

## 2020-05-06 MED ORDER — HALOPERIDOL LACTATE 5 MG/ML IJ SOLN
5.0000 mg | Freq: Four times a day (QID) | INTRAMUSCULAR | Status: DC | PRN
  Administered 2020-05-06 – 2020-05-07 (×3): 5 mg via INTRAVENOUS
  Filled 2020-05-06 (×3): qty 1

## 2020-05-06 MED ORDER — LORAZEPAM 2 MG/ML IJ SOLN
INTRAMUSCULAR | Status: AC
  Filled 2020-05-06: qty 2

## 2020-05-06 MED ORDER — DEXAMETHASONE SODIUM PHOSPHATE 10 MG/ML IJ SOLN
8.0000 mg | Freq: Once | INTRAMUSCULAR | Status: AC
  Administered 2020-05-06: 8 mg via INTRAVENOUS
  Filled 2020-05-06: qty 1

## 2020-05-06 MED ORDER — CHLORHEXIDINE GLUCONATE 0.12 % MT SOLN
15.0000 mL | Freq: Two times a day (BID) | OROMUCOSAL | Status: DC
  Administered 2020-05-07 – 2020-05-12 (×10): 15 mL via OROMUCOSAL
  Filled 2020-05-06 (×6): qty 15

## 2020-05-06 MED ORDER — LORAZEPAM 2 MG/ML IJ SOLN
4.0000 mg | Freq: Once | INTRAMUSCULAR | Status: AC
  Administered 2020-05-06: 4 mg via INTRAVENOUS

## 2020-05-06 NOTE — Progress Notes (Signed)
RT NOTE: RT called to room due to patient had self extubated. CCM MD and RN both at bedside with RT. Patient placed on NRB with sats at 100%. RT placed patient on 10L [place] with sats holding at 94%. VSS. Prt CCM sat goal 88% or greater. RT will continue to monitor.

## 2020-05-06 NOTE — Progress Notes (Signed)
Assisted tele visit to patient with family member.  Jailon Schaible M, RN  

## 2020-05-06 NOTE — Progress Notes (Signed)
NAME:  Timothy Case, MRN:  [id number], DOB:  01/29/1956, LOS: 2 ADMISSION DATE:  05/04/2020, CONSULTATION DATE:  05/04/2020 REFERRING MD: Venetia Maxon, CHIEF COMPLAINT:  SAH   HPI/course in hospital  65 year old man presented with history of several days of severe headache. Was intact this morning initially, but wife eventually found him on the ground with facial trauma complaining of headache. Became increasingly aphasic and confused before becoming more obtunded. Was intubated for airway protection.   CT showed small frontal contusions and moderate subarachnoid blood.   05/05/2019 - CTA no aneurysm. 05/06/2019 - Repeat CT head shows improved   Past Medical History   Past Medical History:  Diagnosis Date  . Chronic lower back pain    "q am; cause I'm overweight" (04/21/2012)  . Essential familial hyperlipidemia   . GERD (gastroesophageal reflux disease)   . Hyperlipidemia   . Hypertension   . Shortness of breath    "lying down; sometimes" (04/21/2012)  . Stroke (HCC) 04/21/2012   "they are leaning towards mini stroke today; all S/S still gone now except little chest pain" (04/21/2012)     Past Surgical History:  Procedure Laterality Date  . KNEE ARTHROSCOPY  ~ 2008   "both knees; ~ 6 months apart" (04/21/2012)    Interim history/subjective:   Improved CT scan yesterday with no signs of recurrent bleeding.  This morning patient self extubated.  Was able to follow commands and state his name.  Placed on nasal cannula and continued on Precedex and lorazepam for periodic agitation.  Objective   Blood pressure (!) 144/118, pulse 91, temperature 99.2 F (37.3 C), temperature source Axillary, resp. rate (!) 25, height 5\' 9"  (1.753 m), weight 108 kg, SpO2 100 %.    Vent Mode: PRVC FiO2 (%):  [40 %] 40 % Set Rate:  [20 bmp] 20 bmp Vt Set:  [560 mL] 560 mL PEEP:  [5 cmH20] 5 cmH20 Plateau Pressure:  [15 cmH20-18 cmH20] 18 cmH20   Intake/Output Summary (Last 24 hours) at 05/06/2020  1330 Last data filed at 05/06/2020 0600 Gross per 24 hour  Intake 4292.96 ml  Output 730 ml  Net 3562.96 ml   Filed Weights   05/04/20 1656 05/05/20 0500 05/06/20 0500  Weight: 105 kg 105 kg 108 kg    Examination: Physical Exam Constitutional:      Appearance: He is obese.  HENT:     Head:     Comments: Frontal ecchymosis, mild upper airway stridor postextubation Eyes:     Comments: 28mm bilaterally. Left lid swollen  Cardiovascular:     Rate and Rhythm: Normal rate and regular rhythm.     Heart sounds: Normal heart sounds.  Pulmonary:     Effort: Pulmonary effort is normal.  Extubated on nasal cannula.  No respiratory distress Abdominal:     Palpations: Abdomen is soft.  Genitourinary:    Penis: Normal.   Neurological:     Mental Status: He is intermittently agitated but moves all limbs purposefully.     Ancillary tests (personally reviewed)  CBC: Recent Labs  Lab 05/04/20 1513 05/04/20 1610 05/04/20 1714 05/04/20 2051 05/05/20 0304 05/05/20 0450 05/06/20 0515  WBC 18.6*  --   --   --  10.1  --  7.3  NEUTROABS 15.2*  --   --   --   --   --  5.4  HGB 16.7   < > 16.0 14.6 14.1 13.9 13.7  HCT 51.5   < > 47.0 43.0  41.8 41.0 40.1  MCV 96.4  --   --   --  95.7  --  94.8  PLT 248  --   --   --  186  --  156   < > = values in this interval not displayed.    Basic Metabolic Panel: Recent Labs  Lab 05/04/20 1513 05/04/20 1610 05/04/20 1714 05/04/20 2051 05/05/20 0304 05/05/20 0450 05/05/20 1641 05/06/20 0515  NA 137 138 138 139 138 138  --  139  K 4.0 4.1 4.1 4.2 3.9 3.7  --  3.9  CL 103 107  --   --  107  --   --  110  CO2 21*  --   --   --  20*  --   --  20*  GLUCOSE 131* 122*  --   --  120*  --   --  104*  BUN 28* 35*  --   --  22  --   --  15  CREATININE 1.07 1.00  --   --  0.95  --   --  0.81  CALCIUM 8.7*  --   --   --  8.2*  --   --  7.9*  MG  --   --   --   --  1.9  --  1.9 1.8  PHOS  --   --   --   --  2.6  --  3.3 3.3   GFR: Estimated  Creatinine Clearance: 108.6 mL/min (by C-G formula based on SCr of 0.81 mg/dL). Recent Labs  Lab 05/04/20 1513 05/05/20 0304 05/06/20 0515  WBC 18.6* 10.1 7.3    Liver Function Tests: Recent Labs  Lab 05/04/20 1513  AST 30  ALT 29  ALKPHOS 43  BILITOT 1.3*  PROT 7.4  ALBUMIN 4.6   No results for input(s): LIPASE, AMYLASE in the last 168 hours. No results for input(s): AMMONIA in the last 168 hours.  ABG    Component Value Date/Time   PHART 7.481 (H) 05/05/2020 0450   PCO2ART 26.8 (L) 05/05/2020 0450   PO2ART 107 05/05/2020 0450   HCO3 20.0 05/05/2020 0450   TCO2 21 (L) 05/05/2020 0450   ACIDBASEDEF 2.0 05/05/2020 0450   O2SAT 99.0 05/05/2020 0450     Coagulation Profile: Recent Labs  Lab 05/04/20 1513  INR 1.1    Cardiac Enzymes: Recent Labs  Lab 05/05/20 1641  CKTOTAL 150    HbA1C: Hgb A1c MFr Bld  Date/Time Value Ref Range Status  04/21/2012 06:22 PM 5.9 (H) <5.7 % Final    Comment:    (NOTE)                                                                       According to the ADA Clinical Practice Recommendations for 2011, when HbA1c is used as a screening test:  >=6.5%   Diagnostic of Diabetes Mellitus           (if abnormal result is confirmed) 5.7-6.4%   Increased risk of developing Diabetes Mellitus References:Diagnosis and Classification of Diabetes Mellitus,Diabetes Care,2011,34(Suppl 1):S62-S69 and Standards of Medical Care in         Diabetes - 2011,Diabetes Care,2011,34 (Suppl 1):S11-S61.    CBG:  Recent Labs  Lab 05/05/20 1612 05/05/20 2103 05/06/20 0002 05/06/20 0522 05/06/20 0841  GLUCAP 108* 104* 147* 97 124*     Assessment & Plan:  Critically ill due acute respiratory failure due to acute intracerebral hemorrhage requiring mechanical ventilation SAH and SDH, with frontal contusions of unclear etiology, requiring titration of phenylephrine or clevidipine to maintain BP 110-140. Etiology appears to be traumatic.   Oliguria  Plan:  Appears to be tolerating extubation from a respiratory standpoint.  Appears to be able to follow commands and protect his airway although at times impulsive and agitated. -Low-dose dexmedetomidine with as needed low-dose lorazepam -Wean O2 as tolerated.  Daily Goals Checklist  Pain/Anxiety/Delirium protocol (if indicated): Dexmedetomidine and lorazepam Neuro vitals: every 2 hours AED's: Keppra VAP protocol (if indicated): Now extubated.  We will give 1 dose dexamethasone. Respiratory support goals: Nasal cannula wean to keep saturation greater than 90 Blood pressure target: SBP 110-140 DVT prophylaxis: SCD only, will start chemical prophylaxis today Nutrition Status: Swallow evaluation once mental status allows GI prophylaxis: No longer indicated Fluid status goals: IV normal saline Urinary catheter: Can change to condom catheter today Central lines: PIV only Glucose control: Phase 1 protocol-currently euglycemic Mobility/therapy needs: Bedrest Antibiotic de-escalation: none Home medication reconciliation: on hold. Daily labs: CBC, BMET Code Status: full  Family Communication: Wife updated by phone. Disposition: ICU   Critical care time: 50 min.     Lynnell Catalan, MD [facility] ICU Physician [facility] [place] [facility]  Pager: [phone] Or Epic Secure Chat After hours: [phone].  05/06/2020, 1:30 PM

## 2020-05-06 NOTE — Progress Notes (Signed)
PT Cancellation Note  Patient Details Name: Yoseph Haile MRN: [id number] DOB: 04/05/1956   Cancelled Treatment:    Reason Eval/Treat Not Completed: Medical issues which prohibited therapy  Patient just self-extubated and trying to pull out his OG tube. Nursing notified and donning PPE to go in to assess pt.    Jerolyn Center, PT Pager [phone]  Zena Amos 05/06/2020, 11:11 AM

## 2020-05-06 NOTE — Progress Notes (Signed)
SLP Cancellation Note  Patient Details Name: Timothy Case MRN: [id number] DOB: 08-26-1955   Cancelled treatment:       Reason Eval/Treat Not Completed: Medical issues which prohibited therapy  Per RN patient is currently sedated and intubated.  Will continue to follow to assess for readiness for cognitive/linguistic evaluation.    Dimas Aguas, MA, CCC-SLP Acute Rehab SLP [phone]  Fleet Contras 05/06/2020, 9:44 AM

## 2020-05-06 NOTE — Progress Notes (Signed)
  NEUROSURGERY PROGRESS NOTE   No issues overnight. Pt seen this am  EXAM:  BP 114/82   Pulse 79   Temp 99.2 F (37.3 C) (Axillary)   Resp (!) 24   Ht 5\' 9"  (1.753 m)   Wt 108 kg   SpO2 91%   BMI 35.16 kg/m   Arouses to voice Breathing over vent Localizes BUE Not following commands  IMPRESSION:  65 y.o. male s/p fall, post-traumatic SZ with bifrontal and cerebellar contusion and COVID (+)  PLAN: - Cont supportive care per PCCM - Cont Keppra 1g Q12 per neurology  69, MD [facility]

## 2020-05-07 ENCOUNTER — Inpatient Hospital Stay (HOSPITAL_COMMUNITY): Payer: Medicare Other

## 2020-05-07 DIAGNOSIS — I639 Cerebral infarction, unspecified: Secondary | ICD-10-CM | POA: Diagnosis not present

## 2020-05-07 LAB — POCT I-STAT 7, (LYTES, BLD GAS, ICA,H+H)
Acid-base deficit: 2 mmol/L (ref 0.0–2.0)
Bicarbonate: 24.5 mmol/L (ref 20.0–28.0)
Calcium, Ion: 1.18 mmol/L (ref 1.15–1.40)
HCT: 40 % (ref 39.0–52.0)
Hemoglobin: 13.6 g/dL (ref 13.0–17.0)
O2 Saturation: 99 %
Patient temperature: 99.7
Potassium: 3.3 mmol/L — ABNORMAL LOW (ref 3.5–5.1)
Sodium: 140 mmol/L (ref 135–145)
TCO2: 26 mmol/L (ref 22–32)
pCO2 arterial: 48 mmHg (ref 32.0–48.0)
pH, Arterial: 7.318 — ABNORMAL LOW (ref 7.350–7.450)
pO2, Arterial: 159 mmHg — ABNORMAL HIGH (ref 83.0–108.0)

## 2020-05-07 LAB — GLUCOSE, CAPILLARY
Glucose-Capillary: 103 mg/dL — ABNORMAL HIGH (ref 70–99)
Glucose-Capillary: 104 mg/dL — ABNORMAL HIGH (ref 70–99)
Glucose-Capillary: 108 mg/dL — ABNORMAL HIGH (ref 70–99)
Glucose-Capillary: 108 mg/dL — ABNORMAL HIGH (ref 70–99)
Glucose-Capillary: 112 mg/dL — ABNORMAL HIGH (ref 70–99)
Glucose-Capillary: 112 mg/dL — ABNORMAL HIGH (ref 70–99)
Glucose-Capillary: 97 mg/dL (ref 70–99)

## 2020-05-07 MED ORDER — CLONIDINE HCL 0.3 MG/24HR TD PTWK
0.3000 mg | MEDICATED_PATCH | TRANSDERMAL | Status: DC
  Administered 2020-05-07: 0.3 mg via TRANSDERMAL
  Filled 2020-05-07: qty 1

## 2020-05-07 MED ORDER — ZIPRASIDONE MESYLATE 20 MG IM SOLR
20.0000 mg | Freq: Once | INTRAMUSCULAR | Status: AC
  Administered 2020-05-07: 20 mg via INTRAMUSCULAR
  Filled 2020-05-07: qty 20

## 2020-05-07 MED ORDER — LORAZEPAM 2 MG/ML IJ SOLN
1.0000 mg | INTRAMUSCULAR | Status: DC | PRN
  Administered 2020-05-08 (×2): 1 mg via INTRAVENOUS
  Filled 2020-05-07 (×2): qty 1

## 2020-05-07 NOTE — Progress Notes (Signed)
I spoke with patient's wife and son and reviewed his clinical course and answered their many questions.  I have reassured them that he is likely to recover with time, but will need Rehab.  I told them that he would likely do well with Rehab locally, which would allow the patient's wife to stay home, remain supportive, with as little disruption as possible.  They are agreeable with this.  I will make a Rehab consult for tomorrow.

## 2020-05-07 NOTE — Progress Notes (Signed)
Pt's O2 saturation dropped to the 70s with sonorous respirations. Pt put on NRB, RT & CCM notified and called to bedside. See new orders.

## 2020-05-07 NOTE — Progress Notes (Signed)
  NEUROSURGERY PROGRESS NOTE   Self-extubated yesterday. Has been agitated, combative.  EXAM:  BP (!) 155/82   Pulse (!) 57   Temp 98.6 F (37 C) (Axillary)   Resp (!) 27   Ht 5\' 9"  (1.753 m)   Wt 102.2 kg   SpO2 100%   BMI 33.27 kg/m   Awake, alert Follows commands intermittently MAE good strength  IMPRESSION:  65 y.o. male s/p fall and post-traumatic SZ, very agitated, combative but largely stable, non-focal exam likely related to significant bifrontal contusions.  PLAN: - Cont supportive care - Cont Keppra  69, MD [facility]

## 2020-05-07 NOTE — Progress Notes (Signed)
NAME:  Timothy Case, MRN:  [id number], DOB:  01/02/1956, LOS: 3 ADMISSION DATE:  05/04/2020, CONSULTATION DATE:  05/04/2020 REFERRING MD: Venetia Maxon, CHIEF COMPLAINT:  SAH   HPI/course in hospital  65 year old man presented with history of several days of severe headache. Was intact this morning initially, but wife eventually found him on the ground with facial trauma complaining of headache. Became increasingly aphasic and confused before becoming more obtunded. Was intubated for airway protection.   CT showed small frontal contusions and moderate subarachnoid blood.   05/05/2019 - CTA no aneurysm. 05/06/2019 - Repeat CT head shows improved   Past Medical History   Past Medical History:  Diagnosis Date  . Chronic lower back pain    "q am; cause I'm overweight" (04/21/2012)  . Essential familial hyperlipidemia   . GERD (gastroesophageal reflux disease)   . Hyperlipidemia   . Hypertension   . Shortness of breath    "lying down; sometimes" (04/21/2012)  . Stroke (HCC) 04/21/2012   "they are leaning towards mini stroke today; all S/S still gone now except little chest pain" (04/21/2012)     Past Surgical History:  Procedure Laterality Date  . KNEE ARTHROSCOPY  ~ 2008   "both knees; ~ 6 months apart" (04/21/2012)    Interim history/subjective:   Self extubation yesterday.  Continues to be able to follow commands however has periodic agitation requiring sedation to prevent self-harm.  Objective   Blood pressure (!) 149/89, pulse 70, temperature 99.2 F (37.3 C), temperature source Axillary, resp. rate (!) 26, height 5\' 9"  (1.753 m), weight 102.2 kg, SpO2 100 %.        Intake/Output Summary (Last 24 hours) at 05/07/2020 1315 Last data filed at 05/07/2020 0700 Gross per 24 hour  Intake 2993.19 ml  Output 500 ml  Net 2493.19 ml   Filed Weights   05/05/20 0500 05/06/20 0500 05/07/20 0500  Weight: 105 kg 108 kg 102.2 kg    Examination: Physical Exam Constitutional:       Appearance: He is obese.  HENT:     Head:     Comments: Frontal ecchymosis, mild upper airway stridor postextubation Eyes:     Comments: 29mm bilaterally. Left lid swollen  Cardiovascular:     Rate and Rhythm: Normal rate and regular rhythm.     Heart sounds: Normal heart sounds.  Pulmonary:     Effort: Pulmonary effort is normal.  Extubated on nasal cannula.  No respiratory distress Abdominal:     Palpations: Abdomen is soft.  Genitourinary:    Penis: Normal.   Neurological:     Mental Status: He is intermittently agitated but moves all limbs purposefully.     Ancillary tests (personally reviewed)  CBC: Recent Labs  Lab 05/04/20 1513 05/04/20 1610 05/04/20 1714 05/04/20 2051 05/05/20 0304 05/05/20 0450 05/06/20 0515  WBC 18.6*  --   --   --  10.1  --  7.3  NEUTROABS 15.2*  --   --   --   --   --  5.4  HGB 16.7   < > 16.0 14.6 14.1 13.9 13.7  HCT 51.5   < > 47.0 43.0 41.8 41.0 40.1  MCV 96.4  --   --   --  95.7  --  94.8  PLT 248  --   --   --  186  --  156   < > = values in this interval not displayed.    Basic Metabolic Panel: Recent Labs  Lab 05/04/20 1513 05/04/20 1610 05/04/20 1714 05/04/20 2051 05/05/20 0304 05/05/20 0450 05/05/20 1641 05/06/20 0515 05/06/20 1630  NA 137 138 138 139 138 138  --  139  --   K 4.0 4.1 4.1 4.2 3.9 3.7  --  3.9  --   CL 103 107  --   --  107  --   --  110  --   CO2 21*  --   --   --  20*  --   --  20*  --   GLUCOSE 131* 122*  --   --  120*  --   --  104*  --   BUN 28* 35*  --   --  22  --   --  15  --   CREATININE 1.07 1.00  --   --  0.95  --   --  0.81  --   CALCIUM 8.7*  --   --   --  8.2*  --   --  7.9*  --   MG  --   --   --   --  1.9  --  1.9 1.8 1.6*  PHOS  --   --   --   --  2.6  --  3.3 3.3 2.1*   GFR: Estimated Creatinine Clearance: 105.7 mL/min (by C-G formula based on SCr of 0.81 mg/dL). Recent Labs  Lab 05/04/20 1513 05/05/20 0304 05/06/20 0515  WBC 18.6* 10.1 7.3    Liver Function Tests: Recent Labs   Lab 05/04/20 1513  AST 30  ALT 29  ALKPHOS 43  BILITOT 1.3*  PROT 7.4  ALBUMIN 4.6   No results for input(s): LIPASE, AMYLASE in the last 168 hours. No results for input(s): AMMONIA in the last 168 hours.  ABG    Component Value Date/Time   PHART 7.481 (H) 05/05/2020 0450   PCO2ART 26.8 (L) 05/05/2020 0450   PO2ART 107 05/05/2020 0450   HCO3 20.0 05/05/2020 0450   TCO2 21 (L) 05/05/2020 0450   ACIDBASEDEF 2.0 05/05/2020 0450   O2SAT 99.0 05/05/2020 0450     Coagulation Profile: Recent Labs  Lab 05/04/20 1513  INR 1.1    Cardiac Enzymes: Recent Labs  Lab 05/05/20 1641  CKTOTAL 150    HbA1C: Hgb A1c MFr Bld  Date/Time Value Ref Range Status  04/21/2012 06:22 PM 5.9 (H) <5.7 % Final    Comment:    (NOTE)                                                                       According to the ADA Clinical Practice Recommendations for 2011, when HbA1c is used as a screening test:  >=6.5%   Diagnostic of Diabetes Mellitus           (if abnormal result is confirmed) 5.7-6.4%   Increased risk of developing Diabetes Mellitus References:Diagnosis and Classification of Diabetes Mellitus,Diabetes Care,2011,34(Suppl 1):S62-S69 and Standards of Medical Care in         Diabetes - 2011,Diabetes Care,2011,34 (Suppl 1):S11-S61.    CBG: Recent Labs  Lab 05/06/20 2019 05/07/20 0050 05/07/20 0455 05/07/20 0908 05/07/20 1234  GLUCAP 137* 112* 108* 104* 103*     Assessment & Plan:  Critically ill due to acute intracerebral hemorrhage requiring titration of IV sedation  SAH and SDH, with frontal contusions of unclear etiology, requiring titration of phenylephrine or clevidipine to maintain BP 110-140. Etiology appears to be traumatic.    Plan:  Appears to be tolerating extubation from a respiratory standpoint.  Appears to be able to follow commands and protect his airway although at times impulsive and agitated. -Wean O2 as tolerated. Continues to have ongoing  agitation despite ability to follow commands consistent with frontal lobe injury.  This will take a long time to improve and therefore the patient needs a long-term sedation strategy that minimizes IV medications. -Small bore feeding tube placement tomorrow for nutrition and sedation. -We will start topical clonidine to wean off dexmedetomidine and start Geodon.  Daily Goals Checklist  Pain/Anxiety/Delirium protocol (if indicated): Dexmedetomidine and lorazepam Neuro vitals: every 2 hours AED's: Keppra VAP protocol (if indicated): Now extubated.  Post 1 dose of dexamethasone. Respiratory support goals: Nasal cannula wean to keep saturation greater than 90 Blood pressure target: SBP 110-140 DVT prophylaxis: SCD only, will start chemical prophylaxis today Nutrition Status: Swallow evaluation once mental status allows GI prophylaxis: No longer indicated Fluid status goals: IV normal saline Urinary catheter: Can change to condom catheter today Central lines: PIV only Glucose control: Phase 1 protocol-currently euglycemic Mobility/therapy needs: Bedrest Antibiotic de-escalation: none Home medication reconciliation: on hold. Daily labs: CBC, BMET Code Status: full  Family Communication: Wife updated by phone 1/23 Disposition: ICU   Critical care time: 35 min.     Lynnell Catalan, MD [facility] ICU Physician [facility]  Pager: [phone] Or Epic Secure Chat After hours: [phone].  05/07/2020, 1:15 PM

## 2020-05-08 DIAGNOSIS — R451 Restlessness and agitation: Secondary | ICD-10-CM

## 2020-05-08 DIAGNOSIS — G9340 Encephalopathy, unspecified: Secondary | ICD-10-CM

## 2020-05-08 DIAGNOSIS — S066X9A Traumatic subarachnoid hemorrhage with loss of consciousness of unspecified duration, initial encounter: Principal | ICD-10-CM

## 2020-05-08 DIAGNOSIS — I4891 Unspecified atrial fibrillation: Secondary | ICD-10-CM

## 2020-05-08 DIAGNOSIS — E876 Hypokalemia: Secondary | ICD-10-CM

## 2020-05-08 DIAGNOSIS — S0083XA Contusion of other part of head, initial encounter: Secondary | ICD-10-CM | POA: Diagnosis not present

## 2020-05-08 DIAGNOSIS — I609 Nontraumatic subarachnoid hemorrhage, unspecified: Secondary | ICD-10-CM | POA: Diagnosis not present

## 2020-05-08 DIAGNOSIS — S066X9D Traumatic subarachnoid hemorrhage with loss of consciousness of unspecified duration, subsequent encounter: Secondary | ICD-10-CM | POA: Diagnosis not present

## 2020-05-08 DIAGNOSIS — S065X9A Traumatic subdural hemorrhage with loss of consciousness of unspecified duration, initial encounter: Secondary | ICD-10-CM | POA: Diagnosis not present

## 2020-05-08 LAB — BASIC METABOLIC PANEL
Anion gap: 15 (ref 5–15)
BUN: 11 mg/dL (ref 8–23)
CO2: 22 mmol/L (ref 22–32)
Calcium: 8.5 mg/dL — ABNORMAL LOW (ref 8.9–10.3)
Chloride: 104 mmol/L (ref 98–111)
Creatinine, Ser: 1.12 mg/dL (ref 0.61–1.24)
GFR, Estimated: >60 mL/min (ref >60)
Glucose, Bld: 106 mg/dL — ABNORMAL HIGH (ref 70–99)
Potassium: 3.3 mmol/L — ABNORMAL LOW (ref 3.5–5.1)
Sodium: 141 mmol/L (ref 135–145)

## 2020-05-08 LAB — GLUCOSE, CAPILLARY
Glucose-Capillary: 116 mg/dL — ABNORMAL HIGH (ref 70–99)
Glucose-Capillary: 130 mg/dL — ABNORMAL HIGH (ref 70–99)
Glucose-Capillary: 106 mg/dL — ABNORMAL HIGH (ref 70–99)
Glucose-Capillary: 153 mg/dL — ABNORMAL HIGH (ref 70–99)
Glucose-Capillary: 128 mg/dL — ABNORMAL HIGH (ref 70–99)
Glucose-Capillary: 164 mg/dL — ABNORMAL HIGH (ref 70–99)

## 2020-05-08 LAB — POCT I-STAT 7, (LYTES, BLD GAS, ICA,H+H)
Acid-Base Excess: 0 mmol/L (ref 0.0–2.0)
Bicarbonate: 24 mmol/L (ref 20.0–28.0)
Calcium, Ion: 1.15 mmol/L (ref 1.15–1.40)
HCT: 38 % — ABNORMAL LOW (ref 39.0–52.0)
Hemoglobin: 12.9 g/dL — ABNORMAL LOW (ref 13.0–17.0)
O2 Saturation: 88 %
Patient temperature: 98.9
Potassium: 3.3 mmol/L — ABNORMAL LOW (ref 3.5–5.1)
Sodium: 140 mmol/L (ref 135–145)
TCO2: 25 mmol/L (ref 22–32)
pCO2 arterial: 36.1 mmHg (ref 32.0–48.0)
pH, Arterial: 7.431 (ref 7.350–7.450)
pO2, Arterial: 53 mmHg — ABNORMAL LOW (ref 83.0–108.0)

## 2020-05-08 LAB — CBC WITH DIFFERENTIAL/PLATELET
Abs Immature Granulocytes: 0.03 10*3/uL (ref 0.00–0.07)
Basophils Absolute: 0 10*3/uL (ref 0.0–0.1)
Basophils Relative: 0 %
Eosinophils Absolute: 0 10*3/uL (ref 0.0–0.5)
Eosinophils Relative: 0 %
HCT: 42.3 % (ref 39.0–52.0)
Hemoglobin: 13.9 g/dL (ref 13.0–17.0)
Immature Granulocytes: 0 %
Lymphocytes Relative: 12 %
Lymphs Abs: 1.1 10*3/uL (ref 0.7–4.0)
MCH: 31.2 pg (ref 26.0–34.0)
MCHC: 32.9 g/dL (ref 30.0–36.0)
MCV: 94.8 fL (ref 80.0–100.0)
Monocytes Absolute: 1.2 10*3/uL — ABNORMAL HIGH (ref 0.1–1.0)
Monocytes Relative: 13 %
Neutro Abs: 6.9 10*3/uL (ref 1.7–7.7)
Neutrophils Relative %: 75 %
Platelets: 194 10*3/uL (ref 150–400)
RBC: 4.46 MIL/uL (ref 4.22–5.81)
RDW: 13 % (ref 11.5–15.5)
WBC: 9.4 10*3/uL (ref 4.0–10.5)
nRBC: 0 % (ref 0.0–0.2)

## 2020-05-08 LAB — PHOSPHORUS
Phosphorus: 3.7 mg/dL (ref 2.5–4.6)
Phosphorus: 3.3 mg/dL (ref 2.5–4.6)

## 2020-05-08 LAB — MAGNESIUM
Magnesium: 1.5 mg/dL — ABNORMAL LOW (ref 1.7–2.4)
Magnesium: 2.1 mg/dL (ref 1.7–2.4)

## 2020-05-08 MED ORDER — MAGNESIUM SULFATE 2 GM/50ML IV SOLN
2.0000 g | Freq: Once | INTRAVENOUS | Status: AC
  Administered 2020-05-08: 2 g via INTRAVENOUS
  Filled 2020-05-08: qty 50

## 2020-05-08 MED ORDER — HEPARIN SODIUM (PORCINE) 5000 UNIT/ML IJ SOLN
5000.0000 [IU] | Freq: Three times a day (TID) | INTRAMUSCULAR | Status: DC
  Administered 2020-05-08 – 2020-05-12 (×11): 5000 [IU] via SUBCUTANEOUS
  Filled 2020-05-08 (×11): qty 1

## 2020-05-08 MED ORDER — DILTIAZEM HCL-DEXTROSE 125-5 MG/125ML-% IV SOLN (PREMIX)
5.0000 mg/h | INTRAVENOUS | Status: DC
  Administered 2020-05-08: 15 mg/h via INTRAVENOUS
  Administered 2020-05-08: 5 mg/h via INTRAVENOUS
  Filled 2020-05-08 (×4): qty 125

## 2020-05-08 MED ORDER — CLONIDINE HCL 0.2 MG PO TABS
0.1000 mg | ORAL_TABLET | Freq: Four times a day (QID) | ORAL | Status: AC
  Administered 2020-05-08 – 2020-05-09 (×7): 0.1 mg
  Filled 2020-05-08 (×6): qty 1

## 2020-05-08 MED ORDER — DILTIAZEM HCL 30 MG PO TABS
60.0000 mg | ORAL_TABLET | Freq: Four times a day (QID) | ORAL | Status: DC
  Administered 2020-05-08 – 2020-05-10 (×8): 60 mg
  Filled 2020-05-08: qty 2
  Filled 2020-05-08 (×2): qty 1
  Filled 2020-05-08 (×2): qty 2
  Filled 2020-05-08: qty 1
  Filled 2020-05-08: qty 2
  Filled 2020-05-08 (×2): qty 1
  Filled 2020-05-08: qty 2

## 2020-05-08 MED ORDER — POTASSIUM CHLORIDE 10 MEQ/100ML IV SOLN
10.0000 meq | INTRAVENOUS | Status: AC
  Administered 2020-05-08 (×4): 10 meq via INTRAVENOUS
  Filled 2020-05-08 (×4): qty 100

## 2020-05-08 MED ORDER — HALOPERIDOL LACTATE 5 MG/ML IJ SOLN
5.0000 mg | Freq: Three times a day (TID) | INTRAMUSCULAR | Status: DC
  Administered 2020-05-08 – 2020-05-09 (×4): 5 mg via INTRAVENOUS
  Filled 2020-05-08 (×4): qty 1

## 2020-05-08 MED ORDER — DILTIAZEM 12 MG/ML ORAL SUSPENSION: 60.0000 mg | Freq: Four times a day (QID) | ORAL | Status: DC

## 2020-05-08 MED ORDER — CLONIDINE HCL 0.1 MG PO TABS
0.1000 mg | ORAL_TABLET | Freq: Four times a day (QID) | ORAL | Status: DC
  Filled 2020-05-08: qty 1

## 2020-05-08 MED ORDER — PIVOT 1.5 CAL PO LIQD
1000.0000 mL | ORAL | Status: DC
  Administered 2020-05-08 – 2020-05-11 (×4): 1000 mL
  Filled 2020-05-08 (×11): qty 1000

## 2020-05-08 NOTE — Progress Notes (Signed)
PCCM Interval Progress Note  Paged by RN regarding ongoing tachypnea which pt has had all day since I first saw him this AM.  Mental status unchanged.  Had a brief moment with increased WOB with RR in 40's but improved and RR down to 30's).  ABG on room air with mild hypoxia, otherwise OK.  2 - 4 L  placed by RN.  I will ask our night team to eyeball him later this evening to ensure he is still stable from a respiratory standpoint.   Timothy Case, [facility] For pager details, please see AMION 05/08/2020, 6:37 PM

## 2020-05-08 NOTE — Evaluation (Signed)
Physical Therapy Evaluation Patient Details Name: Timothy Case MRN: [id number] DOB: Mar 19, 1956 Today's Date: 05/08/2020   History of Present Illness  66 year old man presented 05/04/20 with history of several days of severe headache. Wife found him on the ground with facial trauma complaining of headache. Became increasingly aphasic, confused, and then obtunded. Had seizure in ED. Was intubated for airway protection. CT head small SAH bil sylvian fissure, SAH rt occipital lobe, rt frontoparietal SDH, bifrontal hemorrhagic contusions, contusion rt cerebellum, rt occipital bone fracture;  1/22 self-extubated; agitation requiring sedation; afib with RVR  PMH-low back pain, HTN, CVA  Clinical Impression   Pt admitted with above diagnosis. Patient independent with mobility PTA per chart. Patient currently dependent in all mobility, in part due to requiring sedation to control his agitation and keep him/staff safe. Patient was able to follow simple commands and answer simple questions appropriately. Unclear level of support on discharge, however feel he would be a good candidate for CIR (especially now that he is no longer on COVID precautions). Pt currently with functional limitations due to the deficits listed below (see PT Problem List). Pt will benefit from skilled PT to increase their independence and safety with mobility to allow discharge to the venue listed below.       Follow Up Recommendations CIR    Equipment Recommendations  Rolling walker with 5" wheels    Recommendations for Other Services Rehab consult     Precautions / Restrictions Precautions Precautions: Fall;Other (comment) Precaution Comments: periods of agitation      Mobility  Bed Mobility Overal bed mobility: Needs Assistance             General bed mobility comments: to sitting via chair position of bed    Transfers                 General transfer comment: too lethargic to  attempt  Ambulation/Gait                Stairs            Wheelchair Mobility    Modified Rankin (Stroke Patients Only)       Balance Overall balance assessment: Needs assistance Sitting-balance support: No upper extremity supported;Feet unsupported Sitting balance-Leahy Scale: Zero Sitting balance - Comments: assisted to unsupported sitting in chair position with +2 total assist and did not maintain Postural control: Posterior lean                                   Pertinent Vitals/Pain Pain Assessment: Faces Faces Pain Scale: Hurts even more Pain Location: groin (pt reaching toward groin as grimacing) Pain Descriptors / Indicators: Grimacing Pain Intervention(s): Monitored during session    Home Living Family/patient expects to be discharged to:: Unsure                 Additional Comments: pt confused; no family present    Prior Function Level of Independence: Independent         Comments: per chart review     Hand Dominance        Extremity/Trunk Assessment   Upper Extremity Assessment Upper Extremity Assessment: Defer to OT evaluation    Lower Extremity Assessment Lower Extremity Assessment: RLE deficits/detail;LLE deficits/detail RLE Deficits / Details: moving RLE more briskly to command than LLE LLE Deficits / Details: did not move on command, but did slightly move spontaneously  Cervical / Trunk Assessment Cervical / Trunk Assessment: Other exceptions Cervical / Trunk Exceptions: overweight and "solid"  Communication   Communication: Other (comment) (dry mouth making speech difficult to understand)  Cognition Arousal/Alertness: Lethargic;Suspect due to medications (on precedex due to periods of agitation) Behavior During Therapy: Flat affect Overall Cognitive Status: Impaired/Different from baseline Area of Impairment: Orientation;Attention;Following commands;Safety/judgement;Awareness;Rancho level                Rancho Levels of Cognitive Functioning Rancho Los Amigos Scales of Cognitive Functioning: Confused/inappropriate/non-agitated (RN reporting IV behaviors overnight; not observed) Orientation Level: Place;Time;Situation Current Attention Level: Focused;Sustained   Following Commands: Follows one step commands inconsistently;Follows one step commands with increased time Safety/Judgement: Decreased awareness of safety;Decreased awareness of deficits Awareness:  (pre-intellectual)   General Comments: Pt following simple commands with at times repeated cues. Followed commands>75% of the time including open eyes, stick out tongue, rt hand thumbs up, LAQ RLE). Able to state his name and DOB. Not oriented to month, location      General Comments General comments (skin integrity, edema, etc.): RN paused precedex with sats 97-100%, RR 26-41, HR 86-88    Exercises     Assessment/Plan    PT Assessment Patient needs continued PT services  PT Problem List Decreased strength;Decreased balance;Decreased mobility;Decreased cognition;Decreased knowledge of use of DME;Decreased safety awareness;Decreased knowledge of precautions;Cardiopulmonary status limiting activity;Obesity       PT Treatment Interventions DME instruction;Gait training;Stair training;Functional mobility training;Therapeutic activities;Therapeutic exercise;Balance training;Neuromuscular re-education;Cognitive remediation;Patient/family education    PT Goals (Current goals can be found in the Care Plan section)  Acute Rehab PT Goals Patient Stated Goal: wants to go home PT Goal Formulation: Patient unable to participate in goal setting Time For Goal Achievement: 05/22/20 Potential to Achieve Goals: Good    Frequency Min 3X/week   Barriers to discharge        Co-evaluation PT/OT/SLP Co-Evaluation/Treatment: Yes Reason for Co-Treatment: Complexity of the patient's impairments (multi-system involvement);Necessary to  address cognition/behavior during functional activity;For patient/therapist safety PT goals addressed during session: Mobility/safety with mobility;Strengthening/ROM         AM-PAC PT "6 Clicks" Mobility  Outcome Measure Help needed turning from your back to your side while in a flat bed without using bedrails?: Total Help needed moving from lying on your back to sitting on the side of a flat bed without using bedrails?: Total Help needed moving to and from a bed to a chair (including a wheelchair)?: Total Help needed standing up from a chair using your arms (e.g., wheelchair or bedside chair)?: Total Help needed to walk in hospital room?: Total Help needed climbing 3-5 steps with a railing? : Total 6 Click Score: 6    End of Session   Activity Tolerance: Patient tolerated treatment well Patient left: in bed;with call bell/phone within reach;with bed alarm set;with restraints reapplied (bil mitts, wrist restraints, ankle restraints and waist belt) Nurse Communication: Other (comment) (following simple commands; asking to go home) PT Visit Diagnosis: Other symptoms and signs involving the nervous system (G25.638)    Time: 9373-4287 PT Time Calculation (min) (ACUTE ONLY): 28 min   Charges:   PT Evaluation $PT Eval High Complexity: 1 High           Jerolyn Center, PT Pager [phone]   Zena Amos 05/08/2020, 1:09 PM

## 2020-05-08 NOTE — Progress Notes (Signed)
Spoke w/ Rutherford Guys about pt tachypneic in 30s to 40s still today, although oxygen 98-100%. Verbal to monitor and notify of worsening

## 2020-05-08 NOTE — Progress Notes (Signed)
Subjective: Patient reports arouses to voice and follows simple commands. A-fib with RVR overnight.   Objective: Vital signs in last 24 hours: Temp:  [98.6 F (37 C)-99.7 F (37.6 C)] 99.7 F (37.6 C) (01/23 1600) Pulse Rate:  [49-141] 103 (01/24 0600) Resp:  [26-44] 39 (01/24 0600) BP: (113-168)/(75-122) 129/94 (01/24 0600) SpO2:  [94 %-100 %] 99 % (01/24 0600)  Intake/Output from previous day: 01/23 0701 - 01/24 0700 In: 2831.9 [I.V.:2631.9; IV Piggyback:200] Out: 2000 [Urine:2000] Intake/Output this shift: No intake/output data recorded.  Physical Exam: Patient arouses to voice and is able to make eye contact, although not sustained. Speech is dysarthric and unintelligible. Pupils 3 mm round and brisk. MAEW with good strength.   Lab Results: Recent Labs    05/06/20 0515 05/07/20 1807  WBC 7.3  --   HGB 13.7 13.6  HCT 40.1 40.0  PLT 156  --    BMET Recent Labs    05/06/20 0515 05/07/20 1807  NA 139 140  K 3.9 3.3*  CL 110  --   CO2 20*  --   GLUCOSE 104*  --   BUN 15  --   CREATININE 0.81  --   CALCIUM 7.9*  --     Studies/Results: DG CHEST PORT 1 VIEW  Result Date: 05/07/2020 CLINICAL DATA:  65 year old male with history of respiratory distress. EXAM: PORTABLE CHEST 1 VIEW COMPARISON:  Chest CT 05/04/2020. FINDINGS: Lung volumes are low. Patchy multifocal ill-defined opacities and areas of interstitial prominence throughout the lungs bilaterally, most severe throughout the mid to lower lungs, favored to reflect multilobar bilateral pneumonia. No pleural effusions. Pulmonary vasculature is largely obscured. Heart size is borderline enlarged. Upper mediastinal contours are within normal limits. IMPRESSION: 1. The appearance of the chest is favored to reflect multilobar bilateral pneumonia, concerning for potential viral pneumonia such as COVID infection. Electronically Signed   By: Trudie Reed M.D.   On: 05/07/2020 18:13    Assessment/Plan: 65 y.o. male  s/p fall, post-traumatic SZ with bifrontal and cerebellar contusion, SAH, and COVID (+). Continues to have periods of agitation. Went into A-fib with RVR last night. Cardizem gtt started. Precedex gtt being titrated down. Patient following simple commands this morning. Continue supportive care per CCM. Continue Keppra per neurology. Ok to start low dose SQ Heparin. Plan for CIR once appropriate.    LOS: 4 days      Council Mechanic, DNP, NP-C 05/08/2020, 7:46 AM

## 2020-05-08 NOTE — Progress Notes (Addendum)
eLink Physician-Brief Progress Note Patient Name: Timothy Case DOB: 04-Jan-1956 MRN: [id number]   Date of Service  05/08/2020  HPI/Events of Note  EKG reveals atrial fibrillation with rapid ventricular response Incomplete right bundle branch block. Possible Anterior infarct , age undetermined. BP = 135/90 with MAP = 98.   eICU Interventions  Plan: 1. Cardizem IV infusion. Titrate to HR = 65-105.  2. Await results of K+ and Mg++.      Intervention Category Major Interventions: Arrhythmia - evaluation and management  Aleah Ahlgrim Dennard Nip 05/08/2020, 4:17 AM

## 2020-05-08 NOTE — Evaluation (Addendum)
Occupational Therapy Evaluation Patient Details Name: Timothy Case MRN: [id number] DOB: 03/02/1956 Today's Date: 05/08/2020    History of Present Illness 65 year old man presented 05/04/20 with history of several days of severe headache. Wife found him on the ground with facial trauma complaining of headache. Became increasingly aphasic, confused, and then obtunded. Had seizure in ED. Was intubated for airway protection. CT head small SAH bil sylvian fissure, SAH rt occipital lobe, rt frontoparietal SDH, bifrontal hemorrhagic contusions, contusion rt cerebellum, rt occipital bone fracture;  1/22 self-extubated; agitation requiring sedation; afib with RVR  PMH-low back pain, HTN, CVA   Clinical Impression   PT admitted with facial trauma, seizure, CVA, R occipital bone Fx. Pt currently with functional limitiations due to the deficits listed below (see OT problem list). Pt currently with all 4 extremities restrained and posey at the chest. Pt following simple commands with increased arousal from therapist. Pt making a clear statement with OT continuing to push for response with tactile input. Pt oriented to name and DOB. Extensive oral care provided during session. Pt demonstrates rancho Com[place] recovery level V (confused appropriate) Pt is no longer on COVID precautions Pt will benefit from skilled OT to increase their independence and safety with adls and balance to allow discharge CIR.     Follow Up Recommendations  CIR    Equipment Recommendations  Other (comment) (tba in further sessions when more aroused)    Recommendations for Other Services Rehab consult     Precautions / Restrictions Precautions Precautions: Fall;Other (comment) Precaution Comments: periods of agitation      Mobility Bed Mobility Overal bed mobility: Needs Assistance             General bed mobility comments: to sitting via chair position of bed    Transfers                  General transfer comment: too lethargic to attempt    Balance Overall balance assessment: Needs assistance Sitting-balance support: No upper extremity supported;Feet unsupported Sitting balance-Leahy Scale: Zero Sitting balance - Comments: assisted to unsupported sitting in chair position with +2 total assist and did not maintain Postural control: Posterior lean                                 ADL either performed or assessed with clinical judgement   ADL Overall ADL's : Needs assistance/impaired     Grooming: Oral care;Bed level;Total assistance Grooming Details (indicate cue type and reason): pt tolerating extensive oral care. pt appropriately allowing thearpist to suction mouth                               General ADL Comments: total (A) for all adls.     Vision   Additional Comments: noted to have dysconjugate gaze with L eye inward rotation toward nose. pt with redness to lateal aspect of eye. pt opening L eye sponatenously during session. pt needs increased arousal to open R eye and does not sustain     Perception     Praxis      Pertinent Vitals/Pain Pain Assessment: Faces Faces Pain Scale: Hurts even more Pain Location: groin (pt reaching toward groin as grimacing) Pain Descriptors / Indicators: Grimacing Pain Intervention(s): Monitored during session;Repositioned     Hand Dominance  (unknown)   Extremity/Trunk Assessment Upper Extremity Assessment Upper Extremity  Assessment: Generalized weakness;RUE deficits/detail;LUE deficits/detail RUE Deficits / Details: moving against gravity showing thumbs up RUE Coordination: decreased fine motor;decreased gross motor LUE Deficits / Details: moving against gravity LUE Coordination: decreased fine motor;decreased gross motor   Lower Extremity Assessment Lower Extremity Assessment: Defer to PT evaluation RLE Deficits / Details: moving RLE more briskly to command than LLE LLE Deficits /  Details: did not move on command, but did slightly move spontaneously   Cervical / Trunk Assessment Cervical / Trunk Assessment: Other exceptions Cervical / Trunk Exceptions: overweight and "solid"   Communication Communication Communication: Other (comment) (dry mouth making speech difficult to understand)   Cognition Arousal/Alertness: Lethargic;Suspect due to medications (on precedex due to periods of agitation) Behavior During Therapy: Flat affect Overall Cognitive Status: Impaired/Different from baseline Area of Impairment: Orientation;Attention;Following commands;Safety/judgement;Awareness;Rancho level               Rancho Levels of Cognitive Functioning Ranch[place]igos Scales of Cognitive Functioning: Confused/inappropriate/non-agitated (RN reporting IV behaviors overnight; not observed) Orientation Level: Place;Time;Situation Current Attention Level: Focused;Sustained   Following Commands: Follows one step commands inconsistently;Follows one step commands with increased time Safety/Judgement: Decreased awareness of safety;Decreased awareness of deficits Awareness:  (pre-intellectual)   General Comments: Pt following simple commands with at times repeated cues. Followed commands>75% of the time including open eyes, stick out tongue, rt hand thumbs up, LAQ RLE). Able to state his name and DOB. Not oriented to month, location   General Comments  RN paused precedex with sats 97-100%, RR 26-41, HR 86-88    Exercises     Shoulder Instructions      Home Living Family/patient expects to be discharged to:: Unsure                                 Additional Comments: pt confused; no family present      Prior Functioning/Environment Level of Independence: Independent        Comments: per chart review        OT Problem List: Decreased strength;Decreased range of motion;Decreased activity tolerance;Impaired balance (sitting and/or standing);Impaired  vision/perception;Decreased coordination;Decreased cognition;Decreased safety awareness;Decreased knowledge of use of DME or AE;Decreased knowledge of precautions;Obesity;Pain      OT Treatment/Interventions: Self-care/ADL training;Therapeutic exercise;Neuromuscular education;Energy conservation;DME and/or AE instruction;Manual therapy;Modalities;Therapeutic activities;Cognitive remediation/compensation;Visual/perceptual remediation/compensation;Patient/family education;Balance training    OT Goals(Current goals can be found in the care plan section) Acute Rehab OT Goals Patient Stated Goal: wants to go home OT Goal Formulation: Patient unable to participate in goal setting Potential to Achieve Goals: Fair  OT Frequency: Min 2X/week   Barriers to D/C:            Co-evaluation PT/OT/SLP Co-Evaluation/Treatment: Yes Reason for Co-Treatment: Complexity of the patient's impairments (multi-system involvement);Necessary to address cognition/behavior during functional activity;For patient/therapist safety;To address functional/ADL transfers PT goals addressed during session: Mobility/safety with mobility;Strengthening/ROM OT goals addressed during session: ADL's and self-care;Proper use of Adaptive equipment and DME;Strengthening/ROM      AM-PAC OT "6 Clicks" Daily Activity     Outcome Measure Help from another person eating meals?: Total Help from another person taking care of personal grooming?: Total Help from another person toileting, which includes using toliet, bedpan, or urinal?: Total Help from another person bathing (including washing, rinsing, drying)?: Total Help from another person to put on and taking off regular upper body clothing?: Total Help from another person to put on and taking off regular lower body  clothing?: Total 6 Click Score: 6   End of Session Nurse Communication: Mobility status;Precautions  Activity Tolerance: Patient tolerated treatment well Patient left:  in bed;with call bell/phone within reach;with bed alarm set;with restraints reapplied  OT Visit Diagnosis: Unsteadiness on feet (R26.81)                Time: 4917-9150 OT Time Calculation (min): 28 min Charges:  OT General Charges $OT Visit: 1 Visit OT Evaluation $OT Eval High Complexity: 1 High   Brynn, OTR/L  Acute Rehabilitation Services Pager: [phone] Office: [phone] .   Mateo Flow 05/08/2020, 1:50 PM

## 2020-05-08 NOTE — Progress Notes (Signed)
eLink Physician-Brief Progress Note Patient Name: Timothy Case DOB: 07-16-1955 MRN: [id number]   Date of Service  05/08/2020  HPI/Events of Note  Nursing concerned about increased RR = 35-40 and increased WOB. Sat = 100% on [place] O2. He appears to be very similar clinically to last evening.   eICU Interventions  Will request that the ground team evaluate him at bedside.      Intervention Category Major Interventions: Respiratory failure - evaluation and management  Lenell Antu 05/08/2020, 8:33 PM

## 2020-05-08 NOTE — Procedures (Signed)
Cortrak  Person Inserting Tube:  Osa Craver, RD Tube Type:  Cortrak - 43 inches Tube Location:  Right nare Initial Placement:  Stomach Secured by: Bridle Technique Used to Measure Tube Placement:  Documented cm marking at nare/ corner of mouth Cortrak Secured At:  74 cm    Cortrak Tube Team Note:  Consult received to place a Cortrak feeding tube.   No x-ray is required. RN may begin using tube.   If the tube becomes dislodged please keep the tube and contact the Cortrak team at [email or web address] (password [email or web address]) for replacement.  If after hours and replacement cannot be delayed, place a NG tube and confirm placement with an abdominal x-ray.   Romelle Starcher MS, RDN, LDN, CNSC Registered Dietitian III Clinical Nutrition RD Pager and On-Call Pager Number Located in [place]

## 2020-05-08 NOTE — Progress Notes (Signed)
SLP Cancellation Note  Patient Details Name: Timothy Case MRN: [id number] DOB: 09-11-1955   Cancelled treatment:        Pt not ready for speech-language eval. Decreased alertness with snoring respirations per RN. Will check tomorrow. ST does not have orders to assess swallow, just speech-lang-cog.   Royce Macadamia 05/08/2020, 8:55 AM

## 2020-05-08 NOTE — Progress Notes (Signed)
Pt RR in 40s for about an hour, increased shortness of breath and labored breathing, Rahul Desai paged and ordered an ABG.

## 2020-05-08 NOTE — Consult Note (Signed)
Physical Medicine and Rehabilitation Consult Reason for Consult: Altered mental status with aphasia Referring Physician: Dr. Vassie Loll   HPI: Timothy Case is a 65 y.o. right-handed male with history of CVA maintained on aspirin.  Covid infection in March and then again in December 2021,, hypertension, hyperlipidemia.  Presented 05/04/2020 after being found down by his wife with altered mental status and aphasia.  Wife had reported headache.  Admission chemistries unremarkable except BUN 28, glucose 131, SARS corona virus positive, urine culture no growth.  Cranial CT scan showed bilateral subdural hematomas without midline shift.  Diffuse subarachnoid hemorrhage predominantly in the sylvian fissures with sparing of the basilar cisterns.  There were bifrontal hemorrhagic contusions.  Negative for facial fracture negative cervical spine.  CTA of the head no vascular malformation.  Follow-up CT scan of the head expected blooming of hemorrhagic contusions in the bilateral inferior frontal lobes and right cerebellum.  Traumatic pattern subarachnoid hemorrhage with either increase or diffusion since prior.  Slight increase in right frontal parietal subdural hematoma which measures up to 7 mm in thickness.  No hydrocephalus.  There was findings of a nondisplaced right occipital bone fracture.  EEG negative for seizures maintained on Keppra for seizure prophylaxis.  Neurosurgery as well as neurology consulted advised conservative care.  Hospital course patient did require intubation for airway protection self extubated 05/06/2020.  Patient with bouts of atrial fibrillation with RVR placed on intravenous Cardizem.  In regards to patient's Covid positive testing follow-up rapid antigen test was negative it was felt that Covid positive PCR test indicated dead virus shedding rather than active infection and isolation discontinued.  Bouts of agitation required Precedex as well as Haldol.  He is n.p.o.  with alternative means of nutritional support.  Therapy evaluations completed with recommendations of physical medicine rehab consult.  Pt screaming obscenities while wife in room- saying he would divorce her if she didn't get him out of restraints and take him home.    Review of Systems  Unable to perform ROS: Acuity of condition   Past Medical History:  Diagnosis Date  . Chronic lower back pain    "q am; cause I'm overweight" (04/21/2012)  . Essential familial hyperlipidemia   . GERD (gastroesophageal reflux disease)   . Hyperlipidemia   . Hypertension   . Shortness of breath    "lying down; sometimes" (04/21/2012)  . Stroke (HCC) 04/21/2012   "they are leaning towards mini stroke today; all S/S still gone now except little chest pain" (04/21/2012)   Past Surgical History:  Procedure Laterality Date  . KNEE ARTHROSCOPY  ~ 2008   "both knees; ~ 6 months apart" (04/21/2012)   No family history on file. Social History:  reports that he has never smoked. He has never used smokeless tobacco. He reports current alcohol use of about 6.0 standard drinks of alcohol per week. He reports that he does not use drugs. Allergies: No Known Allergies Medications Prior to Admission  Medication Sig Dispense Refill  . acetaminophen (TYLENOL) 500 MG tablet Take 1,000 mg by mouth every 6 (six) hours as needed for mild pain or headache.    Marland Kitchen aspirin EC 81 MG tablet Take 81 mg by mouth daily. Swallow whole.    Marland Kitchen atorvastatin (LIPITOR) 20 MG tablet Take 20 mg by mouth daily.     . fluticasone (FLONASE) 50 MCG/ACT nasal spray Place 2 sprays into the nose daily as needed for allergies.    . Glucosamine-Chondroitin 500-250 MG  CAPS Take 2 capsules by mouth daily.    Marland Kitchen levocetirizine (XYZAL) 5 MG tablet Take 5 mg by mouth daily as needed for allergies.    . naproxen sodium (ALEVE) 220 MG tablet Take 220 mg by mouth daily as needed (back pain).    Marland Kitchen omeprazole (PRILOSEC) 40 MG capsule Take 40 mg by mouth daily as  needed (acid reflux).    . ramipril (ALTACE) 5 MG capsule Take 10 mg by mouth daily.     . rosuvastatin (CRESTOR) 20 MG tablet Take 20 mg by mouth daily.    Marland Kitchen gabapentin (NEURONTIN) 100 MG capsule Take 2 capsules (200 mg total) by mouth at bedtime. (Patient not taking: No sig reported) 60 capsule 3  . predniSONE (DELTASONE) 50 MG tablet Take 1 tablet (50 mg total) by mouth daily. (Patient not taking: No sig reported) 5 tablet 0  . predniSONE (DELTASONE) 50 MG tablet Take 1 tablet (50 mg total) by mouth daily. (Patient not taking: No sig reported) 5 tablet 0  . traMADol (ULTRAM) 50 MG tablet Take 1 tablet (50 mg total) by mouth every 8 (eight) hours as needed. (Patient not taking: No sig reported) 30 tablet 0    Home: Home Living Family/patient expects to be discharged to:: Unsure Additional Comments: pt confused; no family present  Functional History: Prior Function Level of Independence: Independent Comments: per chart review Functional Status:  Mobility: Bed Mobility Overal bed mobility: Needs Assistance General bed mobility comments: to sitting via chair position of bed Transfers General transfer comment: too lethargic to attempt      ADL: ADL Overall ADL's : Needs assistance/impaired Grooming: Oral care,Bed level,Total assistance Grooming Details (indicate cue type and reason): pt tolerating extensive oral care. pt appropriately allowing thearpist to suction mouth General ADL Comments: total (A) for all adls.  Cognition: Cognition Overall Cognitive Status: Impaired/Different from baseline Orientation Level: Oriented to person [employer] Scales of Cognitive Functioning: Confused/inappropriate/non-agitated (RN reporting IV behaviors overnight; not observed) Cognition Arousal/Alertness: Lethargic,Suspect due to medications (on precedex due to periods of agitation) Behavior During Therapy: Flat affect Overall Cognitive Status: Impaired/Different from baseline Area of  Impairment: Orientation,Attention,Following commands,Safety/judgement,Awareness,Rancho level Orientation Level: Place,Time,Situation Current Attention Level: Focused,Sustained Following Commands: Follows one step commands inconsistently,Follows one step commands with increased time Safety/Judgement: Decreased awareness of safety,Decreased awareness of deficits Awareness:  (pre-intellectual) General Comments: Pt following simple commands with at times repeated cues. Followed commands>75% of the time including open eyes, stick out tongue, rt hand thumbs up, LAQ RLE). Able to state his name and DOB. Not oriented to month, location  Blood pressure 115/89, pulse 87, temperature 98.8 F (37.1 C), temperature source Axillary, resp. rate (!) 42, height 5\' 9"  (1.753 m), weight 102.2 kg, SpO2 99 %. Physical Exam Vitals and nursing note reviewed. Exam conducted with a chaperone present.  Constitutional:      Appearance: He is obese.     Comments: Pt yelling obscenities at me and wife because we won't take of restraints- immediately trying to remove Cortrak in very physical manner- would have harmed self if didn't have restraints, wife at bedside, tearful, has cortrak in, inappropriate, but no ACUTE distress; c/o back and leg pain  HENT:     Head: Normocephalic.     Comments: Has B/L raccoon eyes, bloodshot eyes, eyes red/swollen - conjugate gaze    Nose: Nose normal. No congestion.     Mouth/Throat:     Mouth: Mucous membranes are dry.     Pharynx: Oropharynx  is clear. No oropharyngeal exudate.  Eyes:     Extraocular Movements: Extraocular movements intact.  Cardiovascular:     Rate and Rhythm: Regular rhythm.     Heart sounds: Normal heart sounds. No murmur heard.     Comments: tahcycardic in 100s occ when upset Pulmonary:     Comments: CTA B/L- no W/R/R- good air movement  Abdominal:     Comments: Soft, NT, (+)BS hypoactive- protuberant vs distended  Musculoskeletal:     Cervical back:  Normal range of motion. No rigidity.     Comments: Moving all 4 extremities- cannot check- will not comply with exam- wearing wrist restraints and mittens B/L  Skin:    Comments: Bruised face  Neurological:     Comments: Patient is alert in no acute distress.  Follows simple commands.  He was able to provide his name and date of birth but very poor recall of events as well is limited insight and awareness Pt is Ranchos IV due to severe agitation, name calling, etc (per wife this is VERY abnormal for pt) and wife said it's the most coherent he's been since admission  Psychiatric:     Comments: Agitated/yelling     Results for orders placed or performed during the hospital encounter of 05/04/20 (from the past 24 hour(s))  Glucose, capillary     Status: Abnormal   Collection Time: 05/07/20  3:21 PM  Result Value Ref Range   Glucose-Capillary 112 (H) 70 - 99 mg/dL  I-STAT 7, (LYTES, BLD GAS, ICA, H+H)     Status: Abnormal   Collection Time: 05/07/20  6:07 PM  Result Value Ref Range   pH, Arterial 7.318 (L) 7.350 - 7.450   pCO2 arterial 48.0 32.0 - 48.0 mmHg   pO2, Arterial 159 (H) 83.0 - 108.0 mmHg   Bicarbonate 24.5 20.0 - 28.0 mmol/L   TCO2 26 22 - 32 mmol/L   O2 Saturation 99.0 %   Acid-base deficit 2.0 0.0 - 2.0 mmol/L   Sodium 140 135 - 145 mmol/L   Potassium 3.3 (L) 3.5 - 5.1 mmol/L   Calcium, Ion 1.18 1.15 - 1.40 mmol/L   HCT 40.0 39.0 - 52.0 %   Hemoglobin 13.6 13.0 - 17.0 g/dL   Patient temperature 13.299.7 F    Collection site Radial    Drawn by Operator    Sample type ARTERIAL   Glucose, capillary     Status: Abnormal   Collection Time: 05/07/20  8:32 PM  Result Value Ref Range   Glucose-Capillary 108 (H) 70 - 99 mg/dL  Glucose, capillary     Status: None   Collection Time: 05/07/20 11:34 PM  Result Value Ref Range   Glucose-Capillary 97 70 - 99 mg/dL  Glucose, capillary     Status: Abnormal   Collection Time: 05/08/20  3:32 AM  Result Value Ref Range    Glucose-Capillary 116 (H) 70 - 99 mg/dL  CBC with Differential/Platelet     Status: Abnormal   Collection Time: 05/08/20  6:28 AM  Result Value Ref Range   WBC 9.4 4.0 - 10.5 K/uL   RBC 4.46 4.22 - 5.81 MIL/uL   Hemoglobin 13.9 13.0 - 17.0 g/dL   HCT 44.042.3 10.239.0 - 72.552.0 %   MCV 94.8 80.0 - 100.0 fL   MCH 31.2 26.0 - 34.0 pg   MCHC 32.9 30.0 - 36.0 g/dL   RDW 36.613.0 44.011.5 - 34.715.5 %   Platelets 194 150 - 400 K/uL   nRBC 0.0 0.0 -  0.2 %   Neutrophils Relative % 75 %   Neutro Abs 6.9 1.7 - 7.7 K/uL   Lymphocytes Relative 12 %   Lymphs Abs 1.1 0.7 - 4.0 K/uL   Monocytes Relative 13 %   Monocytes Absolute 1.2 (H) 0.1 - 1.0 K/uL   Eosinophils Relative 0 %   Eosinophils Absolute 0.0 0.0 - 0.5 K/uL   Basophils Relative 0 %   Basophils Absolute 0.0 0.0 - 0.1 K/uL   Immature Granulocytes 0 %   Abs Immature Granulocytes 0.03 0.00 - 0.07 K/uL  Basic metabolic panel     Status: Abnormal   Collection Time: 05/08/20  6:28 AM  Result Value Ref Range   Sodium 141 135 - 145 mmol/L   Potassium 3.3 (L) 3.5 - 5.1 mmol/L   Chloride 104 98 - 111 mmol/L   CO2 22 22 - 32 mmol/L   Glucose, Bld 106 (H) 70 - 99 mg/dL   BUN 11 8 - 23 mg/dL   Creatinine, Ser 1.61 0.61 - 1.24 mg/dL   Calcium 8.5 (L) 8.9 - 10.3 mg/dL   GFR, Estimated >09 >60 mL/min   Anion gap 15 5 - 15  Magnesium     Status: Abnormal   Collection Time: 05/08/20  6:28 AM  Result Value Ref Range   Magnesium 1.5 (L) 1.7 - 2.4 mg/dL  Phosphorus     Status: None   Collection Time: 05/08/20  6:28 AM  Result Value Ref Range   Phosphorus 3.7 2.5 - 4.6 mg/dL  Glucose, capillary     Status: Abnormal   Collection Time: 05/08/20  8:31 AM  Result Value Ref Range   Glucose-Capillary 130 (H) 70 - 99 mg/dL  Glucose, capillary     Status: Abnormal   Collection Time: 05/08/20 11:50 AM  Result Value Ref Range   Glucose-Capillary 106 (H) 70 - 99 mg/dL   DG CHEST PORT 1 VIEW  Result Date: 05/07/2020 CLINICAL DATA:  65 year old male with history of  respiratory distress. EXAM: PORTABLE CHEST 1 VIEW COMPARISON:  Chest CT 05/04/2020. FINDINGS: Lung volumes are low. Patchy multifocal ill-defined opacities and areas of interstitial prominence throughout the lungs bilaterally, most severe throughout the mid to lower lungs, favored to reflect multilobar bilateral pneumonia. No pleural effusions. Pulmonary vasculature is largely obscured. Heart size is borderline enlarged. Upper mediastinal contours are within normal limits. IMPRESSION: 1. The appearance of the chest is favored to reflect multilobar bilateral pneumonia, concerning for potential viral pneumonia such as COVID infection. Electronically Signed   By: Trudie Reed M.D.   On: 05/07/2020 18:13     Assessment/Plan: 1. Diagnosis: TBI ranchos IV 2. Does the need for close, 24 hr/day medical supervision in concert with the patient's rehab needs make it unreasonable for this patient to be served in a less intensive setting? Yes 3. Co-Morbidities requiring supervision/potential complications: TBI, SAH, COVID (+)- off precautions, agitation, dysphagia- is NPO 4. Due to bladder management, bowel management, safety, skin/wound care, disease management, medication administration, pain management and patient education, does the patient require 24 hr/day rehab nursing? Yes 5. Does the patient require coordinated care of a physician, rehab nurse, therapy disciplines of PT, OT and SLP to address physical and functional deficits in the context of the above medical diagnosis(es)? Yes Addressing deficits in the following areas: balance, endurance, locomotion, strength, transferring, bowel/bladder control, bathing, dressing, feeding, grooming, toileting, cognition, speech, swallowing and psychosocial support 6. Can the patient actively participate in an intensive therapy program of at least  3 hrs of therapy per day at least 5 days per week? Yes 7. The potential for patient to make measurable gains while on  inpatient rehab is good 8. Anticipated functional outcomes upon discharge from inpatient rehab are supervision and min assist  with PT, supervision and min assist with OT, supervision and min assist with SLP. 9. Estimated rehab length of stay to reach the above functional goals is: 3 weeks or more 10. Anticipated discharge destination: Home 11. Overall Rehab/Functional Prognosis: good  RECOMMENDATIONS: This patient's condition is appropriate for continued rehabilitative care in the following setting: CIR Patient has agreed to participate in recommended program. Potentially Note that insurance prior authorization may be required for reimbursement for recommended care.  Comment:  1. Suggest Vit B complex if possible can help with agitation from Keppra 2. Suggest low dose Seroquel 25 mg BID- should hopefully help immediately so other meds can work. 3. Suggest something for pain- usually Norco, not Tramadol due to seizure issues- pt complaining severely of pain in back and legs 4. LBM was 6 days ago- suggest sorbitol then follow in 3 hours or so with Enema vs suppository 5. Pt is Ranchos IV, not V by the actions and behavior I saw today- needs restraints obviously to protect himself.  6. If possible, suggest Gabapentin 100 mg TID_ can increase every 2 days or so to help as well with agitation.  7. BP was high when I was in room- so low dose Propanolol could also help behavior.  8. Wife is emphatic that if she and son, Weston Brassick could trade off (both are vaccinated), it would calm pt's agitation more- I can see this occurring and if it doesn't, then can be stopped, but if so, could reduce amount of meds required to calm pt down.  9. I spoke to pt's wife and her son via phone about my recommendations.  10. Will submit for eventual admission to CIR- will d/w admissions coordinators.  11. Thank you for this consult.   I spent a total of 85 minutes on total consult- 20 min doing chart, 1 hour with  pt/family and 25 minutes with note and speaking with nurse.  >50% coordination of care.    Mcarthur RossettiDaniel J Angiulli, PA-C 05/08/2020    I have personally performed a face to face diagnostic evaluation of this patient and formulated the key components of the plan.  Additionally, I have personally reviewed laboratory data, imaging studies, as well as relevant notes and concur with the physician assistant's documentation above.

## 2020-05-08 NOTE — Progress Notes (Signed)
Physical medicine rehab consult requested chart reviewed.  Patient self extubated 05/06/2020.  Heart rate currently in the 160s followed by critical care.  Therapy evaluations are currently pending.  We will wait to establish therapy evaluations and follow-up with formal rehabilitation consult.

## 2020-05-08 NOTE — Progress Notes (Signed)
Nutrition Follow-up  DOCUMENTATION CODES:   Obesity unspecified  INTERVENTION:   Initiate tube feeding via Cortrak tube (once placed): Pivot 1.5 at 65 ml/h (1560 ml per day)  Provides 2340 kcal, 146 gm protein, 1184 ml free water daily  Continue to monitor phosphorus and magnesium    NUTRITION DIAGNOSIS:   Inadequate oral intake related to acute illness as evidenced by NPO status. Ongoing.   GOAL:   Patient will meet greater than or equal to 90% of their needs Progressing.   MONITOR:   Vent status,Labs,Weight trends,TF tolerance  REASON FOR ASSESSMENT:   Consult Enteral/tube feeding initiation and management  ASSESSMENT:   65 year old male who presented to the ED on 1/20 with AMS and after an unwitnessed fall. PMH of HLD, HTN, GERD, TIA in 2014. CT scan revealing bilateral SDH, diffuse SAH, bifrontal hemorrhagic contusions, and a small left cerebellar hemorrhagic contusion. Pt had a witnessed seizure in the ED and was intubated for airway protection. Pt also tested positive for COVID-19.  Pt discussed during ICU rounds and with RN.   1/22 self-extubated  1/24 cortrak placed; tip gastric   Pt not ready for swallow evaluation; plan for cortrak placement today.    Medications reviewed and include: colace, novolog Precedex 2 g mag sulfate x 1 10 mEq KCl x 4   Labs reviewed: K+ 3.3, Magnesium: 1.5 CBG: 130 (NPO)  Diet Order:   Diet Order            Diet NPO time specified  Diet effective now                 EDUCATION NEEDS:   No education needs have been identified at this time  Skin:  Skin Assessment: Reviewed RN Assessment  Last BM:  no documented BM  Height:   Ht Readings from Last 1 Encounters:  05/04/20 5\' 9"  (1.753 m)    Weight:   Wt Readings from Last 1 Encounters:  05/07/20 102.2 kg    Ideal Body Weight:  72.7 kg  BMI:  Body mass index is 33.27 kg/m.  Estimated Nutritional Needs:   Kcal:  2200-2400  Protein:  130-150  grams  Fluid:  > 2 L/day  05/09/20., RD, LDN, CNSC See [facility] for contact information

## 2020-05-08 NOTE — TOC Initial Note (Signed)
Transition of Care [facility]) - Initial/Assessment Note    Patient Details  Name: Timothy Case MRN: [id number] Date of Birth: 1956/04/08  [facility]) CM/SW Contact:    Glennon Mac, RN Phone Number: 05/08/2020, 5:19 PM  Clinical Narrative:65 year old man presented 05/04/20 with history of several days of severe headache. Wife found him on the ground with facial trauma complaining of headache. Became increasingly aphasic, confused, and then obtunded. Had seizure in ED. Was intubated for airway protection. CT head small SAH bil sylvian fissure, SAH rt occipital lobe, rt frontoparietal SDH, bifrontal hemorrhagic contusions, contusion rt cerebellum, rt occipital bone fracture.  PTA, pt independent and lives at home with wife and son.  Currently a Rancho V; rehab consulted for possible admission.  Will follow for consult.                    Expected Discharge Plan: IP Rehab Facility Barriers to Discharge: Continued Medical Work up          Expected Discharge Plan and Services Expected Discharge Plan: IP Rehab Facility   Discharge Planning Services: CM Consult Post Acute Care Choice: IP Rehab Living arrangements for the past 2 months: Single Family Home                                      Prior Living Arrangements/Services Living arrangements for the past 2 months: Single Family Home Lives with:: Spouse,Adult Children Patient language and need for interpreter reviewed:: Yes        Need for Family Participation in Patient Care: Yes (Comment) Care giver support system in place?: Yes (comment)   Criminal Activity/Legal Involvement Pertinent to Current Situation/Hospitalization: No - Comment as needed               Emotional Assessment Appearance:: Appears stated age Attitude/Demeanor/Rapport: Unable to Assess,Lethargic Affect (typically observed): Unable to Assess        Admission diagnosis:  Subdural hematoma (HCC)  [S06.5X9A] Subarachnoid bleed (HCC) [I60.9] Contusion of face, initial encounter [S00.83XA] Subconjunctival hemorrhage of left eye [H11.32] Subarachnoid hemorrhage following injury, with loss of consciousness, initial encounter [facility]) [S06.6X9A] Patient Active Problem List   Diagnosis Date Noted  . Contusion of face   . Subarachnoid hemorrhage following injury, with loss of consciousness (HCC)   . Agitation   . Hypokalemia   . Subdural hematoma (HCC) 05/04/2020  . Subarachnoid bleed (HCC) 05/04/2020  . Nonallopathic lesion of sacral region 05/06/2017  . Nonallopathic lesion of lumbar region 05/06/2017  . Nonallopathic lesion of thoracic region 05/06/2017  . 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 Acute encephalopathy 04/22/2012  . TIA (transient ischemic attack) 04/22/2012  . HTN (hypertension) 04/21/2012  . Delirium 04/21/2012  . Obesity 04/21/2012  . Hyperlipidemia 04/21/2012  . GERD (gastroesophageal reflux disease) 04/21/2012   PCP:  Patient, No Pcp Per Pharmacy:   [id number] [place], [place] - [address] [place] [address] Phone: [phone] Fax: [phone]     Social Determinants of Health (SDOH) Interventions    Readmission Risk Interventions No flowsheet data found.   Quintella Baton, RN, BSN  Trauma/Neuro ICU Case Manager [phone]

## 2020-05-08 NOTE — Progress Notes (Signed)
   NAME:  Timothy Case, MRN:  [id number], DOB:  [birth year], LOS: 4 ADMISSION DATE:  05/04/2020, CONSULTATION DATE:  05/04/2020 REFERRING MD: Venetia Maxon, CHIEF COMPLAINT:  SAH   HPI/course in hospital  65 year old man presented with history of several days of severe headache. Was intact this morning initially, but wife eventually found him on the ground with facial trauma complaining of headache. Became increasingly aphasic and confused before becoming more obtunded. Was intubated for airway protection.   CT showed small frontal contusions and moderate subarachnoid blood.   05/05/2019 - CTA no aneurysm. 05/06/2019 - Repeat CT head shows improved  1/22 self extubated  Past Medical History   Past Medical History:  Diagnosis Date  . Chronic lower back pain    "q am; cause I'm overweight" (04/21/2012)  . Essential familial hyperlipidemia   . GERD (gastroesophageal reflux disease)   . Hyperlipidemia   . Hypertension   . Shortness of breath    "lying down; sometimes" (04/21/2012)  . Stroke (HCC) 04/21/2012   "they are leaning towards mini stroke today; all S/S still gone now except little chest pain" (04/21/2012)     Past Surgical History:  Procedure Laterality Date  . KNEE ARTHROSCOPY  ~ 2008   "both knees; ~ 6 months apart" (04/21/2012)    Interim history/subjective:  On cardizem at 15 due to AF RVR and also back on precedex at 0.04.  Objective   Blood pressure (!) 129/94, pulse (!) 103, temperature 99.7 F (37.6 C), temperature source Axillary, resp. rate (!) 39, height 5\' 9"  (1.753 m), weight 102.2 kg, SpO2 99 %.        Intake/Output Summary (Last 24 hours) at 05/08/2020 0729 Last data filed at 05/08/2020 05/10/2020 Gross per 24 hour  Intake 2831.87 ml  Output 2000 ml  Net 831.87 ml   Filed Weights   05/05/20 0500 05/06/20 0500 05/07/20 0500  Weight: 105 kg 108 kg 102.2 kg    Examination: General: Adult male, resting in bed awake and slightly agitated, in NAD. Neuro: Awake,  follows intermittent commands. HEENT: [place]/AT. Sclerae anicteric. Periorbital ecchymosis. Cardiovascular: RRR, no M/R/G.  Lungs: Respirations even and unlabored.  CTA bilaterally, No W/R/R. Abdomen: BS x 4, soft, NT/ND.  Musculoskeletal: No gross deformities, no edema.  Skin: Intact, warm, no rashes.   Assessment & Plan:   ICH, SAH and SDH, with frontal contusions of unclear etiology. Nondisplaced right occipital bone fx. - Continue Keppra. - Needs ongoing rehab, CIR following. - Cortrak placement today then start nutrition until can safely tolerate PO. - Hold subcutaneous heparin for now.  Acute agitation - 2/2 above. - Wean Precedex to off as able. - Start clonidine wean. - Start scheduled Haldol. - Continue PRN ativan.  A.fib with RVR. - Continue Cardizem for now and wean as able. - Replete K as below.  Hypokalemia. - 4 runs K. - Follow BMP.   Critical care time: 30 min.    05/09/20, Rutherford Guys [facility] For pager details, please see AMION 05/08/2020, 7:48 AM

## 2020-05-08 NOTE — Progress Notes (Signed)
eLink Physician-Brief Progress Note Patient Name: Timothy Case [facility] DOB: December 30, 1955 MRN: [id number]   Date of Service  05/08/2020  HPI/Events of Note  Episodes of HR increasing into 160's. BP = 136/108 with MAP = 119. Currently on 4 L/min [place] with sat = 100%.  eICU Interventions  Plan: 1. 12 Lead EKG STAT. 2. Send AM BMP and Mg++ lvel STAT.      Intervention Category Major Interventions: Arrhythmia - evaluation and management  Stevan Eberwein Dennard Nip 05/08/2020, 3:56 AM

## 2020-05-09 ENCOUNTER — Other Ambulatory Visit (HOSPITAL_COMMUNITY): Payer: Medicare Other

## 2020-05-09 DIAGNOSIS — S065X9A Traumatic subdural hemorrhage with loss of consciousness of unspecified duration, initial encounter: Secondary | ICD-10-CM | POA: Diagnosis not present

## 2020-05-09 DIAGNOSIS — I4891 Unspecified atrial fibrillation: Secondary | ICD-10-CM | POA: Diagnosis not present

## 2020-05-09 DIAGNOSIS — R451 Restlessness and agitation: Secondary | ICD-10-CM | POA: Diagnosis not present

## 2020-05-09 DIAGNOSIS — S066X9D Traumatic subarachnoid hemorrhage with loss of consciousness of unspecified duration, subsequent encounter: Secondary | ICD-10-CM | POA: Diagnosis not present

## 2020-05-09 LAB — BASIC METABOLIC PANEL
Anion gap: 10 (ref 5–15)
BUN: 21 mg/dL (ref 8–23)
CO2: 24 mmol/L (ref 22–32)
Calcium: 8.5 mg/dL — ABNORMAL LOW (ref 8.9–10.3)
Chloride: 106 mmol/L (ref 98–111)
Creatinine, Ser: 0.89 mg/dL (ref 0.61–1.24)
GFR, Estimated: >60 mL/min (ref >60)
Glucose, Bld: 150 mg/dL — ABNORMAL HIGH (ref 70–99)
Potassium: 4 mmol/L (ref 3.5–5.1)
Sodium: 140 mmol/L (ref 135–145)

## 2020-05-09 LAB — CBC WITH DIFFERENTIAL/PLATELET
Abs Immature Granulocytes: 0.03 10*3/uL (ref 0.00–0.07)
Basophils Absolute: 0 10*3/uL (ref 0.0–0.1)
Basophils Relative: 0 %
Eosinophils Absolute: 0.1 10*3/uL (ref 0.0–0.5)
Eosinophils Relative: 1 %
HCT: 40.1 % (ref 39.0–52.0)
Hemoglobin: 13.8 g/dL (ref 13.0–17.0)
Immature Granulocytes: 0 %
Lymphocytes Relative: 12 %
Lymphs Abs: 1.1 10*3/uL (ref 0.7–4.0)
MCH: 31.9 pg (ref 26.0–34.0)
MCHC: 34.4 g/dL (ref 30.0–36.0)
MCV: 92.6 fL (ref 80.0–100.0)
Monocytes Absolute: 1.3 10*3/uL — ABNORMAL HIGH (ref 0.1–1.0)
Monocytes Relative: 13 %
Neutro Abs: 6.9 10*3/uL (ref 1.7–7.7)
Neutrophils Relative %: 74 %
Platelets: 149 10*3/uL — ABNORMAL LOW (ref 150–400)
RBC: 4.33 MIL/uL (ref 4.22–5.81)
RDW: 13.1 % (ref 11.5–15.5)
WBC: 9.3 10*3/uL (ref 4.0–10.5)
nRBC: 0 % (ref 0.0–0.2)

## 2020-05-09 LAB — GLUCOSE, CAPILLARY
Glucose-Capillary: 160 mg/dL — ABNORMAL HIGH (ref 70–99)
Glucose-Capillary: 142 mg/dL — ABNORMAL HIGH (ref 70–99)
Glucose-Capillary: 134 mg/dL — ABNORMAL HIGH (ref 70–99)
Glucose-Capillary: 141 mg/dL — ABNORMAL HIGH (ref 70–99)
Glucose-Capillary: 145 mg/dL — ABNORMAL HIGH (ref 70–99)
Glucose-Capillary: 134 mg/dL — ABNORMAL HIGH (ref 70–99)

## 2020-05-09 LAB — PHOSPHORUS
Phosphorus: 2.6 mg/dL (ref 2.5–4.6)
Phosphorus: 2.5 mg/dL (ref 2.5–4.6)

## 2020-05-09 LAB — MAGNESIUM
Magnesium: 2 mg/dL (ref 1.7–2.4)
Magnesium: 1.8 mg/dL (ref 1.7–2.4)

## 2020-05-09 MED ORDER — POLYETHYLENE GLYCOL 3350 17 G PO PACK
17.0000 g | PACK | Freq: Two times a day (BID) | ORAL | Status: DC
  Administered 2020-05-09 (×2): 17 g
  Filled 2020-05-09 (×2): qty 1

## 2020-05-09 MED ORDER — HYDRALAZINE HCL 20 MG/ML IJ SOLN
10.0000 mg | INTRAMUSCULAR | Status: DC | PRN
  Administered 2020-05-10 – 2020-05-11 (×5): 10 mg via INTRAVENOUS
  Filled 2020-05-09 (×5): qty 1

## 2020-05-09 MED ORDER — THIAMINE HCL 100 MG PO TABS
100.0000 mg | ORAL_TABLET | Freq: Every day | ORAL | Status: DC
  Administered 2020-05-09: 100 mg via ORAL
  Filled 2020-05-09: qty 1

## 2020-05-09 MED ORDER — ADULT MULTIVITAMIN LIQUID CH
15.0000 mL | Freq: Every day | ORAL | Status: DC
  Administered 2020-05-09: 15 mL via ORAL
  Filled 2020-05-09 (×2): qty 15

## 2020-05-09 NOTE — Progress Notes (Signed)
eLink Physician-Brief Progress Note Patient Name: Timothy Case DOB: 1955/10/20 MRN: [id number]   Date of Service  05/09/2020  HPI/Events of Note  Request to transfer patient to 18M ICU to make a Neuroscience bed for a trauma patient in the [facility] ED.   eICU Interventions  Will transfer to 18M ICU when bed available.      Intervention Category Major Interventions: Other:  Airam Heidecker Dennard Nip 05/09/2020, 3:02 AM

## 2020-05-09 NOTE — Progress Notes (Signed)
Evaluated at bedside for tachypnea.  ABG looks good.  Oxygenating well. Intermittently easily arousable. On precedex 1. Try weaning down slowly.  Tachypnea may be central, not due to resp insufficiency.  Doesn't  require intubation now, will monitor very closely (resp as well as mental status)

## 2020-05-09 NOTE — Evaluation (Signed)
Clinical/Bedside Swallow Evaluation Patient Details  Name: Timothy Case MRN: [id number] Date of Birth: 1955/06/03  Today's Date: 05/09/2020 Time: SLP Start Time (ACUTE ONLY): 1010 SLP Stop Time (ACUTE ONLY): 1022 SLP Time Calculation (min) (ACUTE ONLY): 12 min  Past Medical History:  Past Medical History:  Diagnosis Date  . Chronic lower back pain    "q am; cause I'm overweight" (04/21/2012)  . Essential familial hyperlipidemia   . GERD (gastroesophageal reflux disease)   . Hyperlipidemia   . Hypertension   . Shortness of breath    "lying down; sometimes" (04/21/2012)  . Stroke (HCC) 04/21/2012   "they are leaning towards mini stroke today; all S/S still gone now except little chest pain" (04/21/2012)   Past Surgical History:  Past Surgical History:  Procedure Laterality Date  . KNEE ARTHROSCOPY  ~ 2008   "both knees; ~ 6 months apart" (04/21/2012)   HPI:  Pt is a 65 y.o. male with a past medical history of CVA, HTN, HLD, GERD, and SOB who was brought into the ED for altered mental status. Pt had a seizure in the ED which lasting ~2 minutes; pt was postictal and required intubation for airway protection. ETT 1/20-1/22 (self extubation). CT head small SAH bil sylvian fissure, SAH rt occipital lobe, rt frontoparietal SDH, bifrontal hemorrhagic contusions, contusion rt cerebellum, rt occipital bone fracture.   Assessment / Plan / Recommendation Clinical Impression  Pt was seen for bedside swallow evaluation with his wife present and his son on Facetime. Pt's wife denied the pt having a history of oropharyngeal dysphagia and described him as a "foodie". Pt required frequent stimulation to maintain alertness and the evaluation was limited due to pt's lethargy. Pt was unable to follow the necessary commands despite verbal cues and models. Pt exhibited reduced bolus awareness with absent bolus manipulation of purees and throat clearing with ice ships, suggesting possible aspiration. Pt  did not demonstrate any sucking from a straw despite cueing from all present parties. It is recommended that his NPO status be maintained and SLP will follow to assess improvement. SLP Visit Diagnosis: Dysphagia, unspecified (R13.10)    Aspiration Risk  Moderate aspiration risk    Diet Recommendation NPO;Alternative means - temporary   Medication Administration: Via alternative means    Other  Recommendations Oral Care Recommendations: Oral care QID   Follow up Recommendations Inpatient Rehab      Frequency and Duration min 2x/week  2 weeks       Prognosis Prognosis for Safe Diet Advancement: Good Barriers to Reach Goals: Cognitive deficits;Severity of deficits      Swallow Study   General Date of Onset: 05/08/20 HPI: Pt is a 65 y.o. male with a past medical history of CVA, HTN, HLD, GERD, and SOB who was brought into the ED for altered mental status. Pt had a seizure in the ED which lasting ~2 minutes; pt was postictal and required intubation for airway protection. ETT 1/20-1/22 (self extubation). CT head small SAH bil sylvian fissure, SAH rt occipital lobe, rt frontoparietal SDH, bifrontal hemorrhagic contusions, contusion rt cerebellum, rt occipital bone fracture. Type of Study: Bedside Swallow Evaluation Previous Swallow Assessment: None Diet Prior to this Study: NPO;NG Tube Temperature Spikes Noted: No Respiratory Status: Nasal cannula History of Recent Intubation: Yes Length of Intubations (days): 2 days Date extubated: 05/06/20 Behavior/Cognition: Alert;Confused;Agitated;Uncooperative;Requires cueing;Doesn't follow directions Oral Cavity Assessment: Dried secretions Oral Care Completed by SLP: No Oral Cavity - Dentition: Adequate natural dentition Vision: Functional for self-feeding Self-Feeding Abilities:  Total assist Patient Positioning: Upright in bed;Postural control adequate for testing Baseline Vocal Quality: Hoarse;Low vocal intensity Volitional Cough:  Weak Volitional Swallow: Unable to elicit    Oral/Motor/Sensory Function Overall Oral Motor/Sensory Function:  (Pt unable to participate)   Ice Chips Ice chips: Impaired Presentation: Spoon Pharyngeal Phase Impairments: Throat Clearing - Immediate   Thin Liquid Thin Liquid: Impaired Presentation: Straw (Pt with reduced awareness of straw despite cueing)    Nectar Thick Nectar Thick Liquid: Not tested   Honey Thick Honey Thick Liquid: Not tested   Puree Puree: Impaired Presentation: Spoon Oral Phase Impairments: Poor awareness of bolus;Reduced lingual movement/coordination   Solid     Solid: Not tested     Yvone Neu I. Vear Clock, MS, CCC-SLP Acute Rehabilitation Services Office number [phone] Pager [phone]   Scheryl Marten 05/09/2020,10:50 AM

## 2020-05-09 NOTE — Progress Notes (Signed)
Assisted tele visit to patient with son.  Rayvon Brandvold D Kahleel Fadeley, RN   

## 2020-05-09 NOTE — Progress Notes (Signed)
Subjective: Patient reports arouses to voice, oriented to self, and following simple commands. Reports of tachypnea overnight.  Objective: Vital signs in last 24 hours: Temp:  [98.8 F (37.1 C)-100.6 F (38.1 C)] 100 F (37.8 C) (01/25 0800) Pulse Rate:  [53-100] 61 (01/25 0800) Resp:  [19-45] 38 (01/25 0800) BP: (108-157)/(63-103) 138/93 (01/25 0800) SpO2:  [95 %-100 %] 100 % (01/25 0800) Weight:  [102.7 kg] 102.7 kg (01/25 0616)  Intake/Output from previous day: 01/24 0701 - 01/25 0700 In: 3327.2 [I.V.:1672.1; NG/GT:1105; IV Piggyback:550.1] Out: 1500 [Urine:1500] Intake/Output this shift: No intake/output data recorded.  Physical Exam: Patient arouses to voice and is able to make eye contact, although not sustained. He is able to follow simple commands. Oriented to self only. Speech remains dysarthric but has slightly improved from yesterday. Pupils 3 mm round and brisk. MAEW with good strength.   Lab Results: Recent Labs    05/08/20 0628 05/08/20 1803  WBC 9.4  --   HGB 13.9 12.9*  HCT 42.3 38.0*  PLT 194  --    BMET Recent Labs    05/08/20 0628 05/08/20 1803 05/09/20 0804  NA 141 140 140  K 3.3* 3.3* 4.0  CL 104  --  106  CO2 22  --  24  GLUCOSE 106*  --  150*  BUN 11  --  21  CREATININE 1.12  --  0.89  CALCIUM 8.5*  --  8.5*    Studies/Results: DG CHEST PORT 1 VIEW  Result Date: 05/07/2020 CLINICAL DATA:  65 year old male with history of respiratory distress. EXAM: PORTABLE CHEST 1 VIEW COMPARISON:  Chest CT 05/04/2020. FINDINGS: Lung volumes are low. Patchy multifocal ill-defined opacities and areas of interstitial prominence throughout the lungs bilaterally, most severe throughout the mid to lower lungs, favored to reflect multilobar bilateral pneumonia. No pleural effusions. Pulmonary vasculature is largely obscured. Heart size is borderline enlarged. Upper mediastinal contours are within normal limits. IMPRESSION: 1. The appearance of the chest is  favored to reflect multilobar bilateral pneumonia, concerning for potential viral pneumonia such as COVID infection. Electronically Signed   By: Trudie Reed M.D.   On: 05/07/2020 18:13    Assessment/Plan: 65 y.o. male s/p fall, post-traumatic SZ with bifrontal and cerebellar contusion, SAH, and COVID (+). Continues to have periods of agitation requiring Precedex gtt. Periods of tachypnea overnight. Patient's neuro exam remains stable with slight improvement. Continue supportive care per CCM. Continue Keppra per neurology. Plan for CIR once appropriate. No new neurosurgical recommendations at this time. Will continue to follow.    LOS: 5 days     Council Mechanic, DNP, NP-C 05/09/2020, 10:24 AM

## 2020-05-09 NOTE — Progress Notes (Signed)
eLink Physician-Brief Progress Note Patient Name: Timothy Case DOB: 1955-06-28 MRN: [id number]   Date of Service  05/09/2020  HPI/Events of Note  Notified of hypertension SBP 160s to 180s. Currently 162/85  HR 74. Has scheduled clonidine 0.1 q 6 and Cardizem 60. Due at midnight. Patient asleep and does not appear to be in pain.  eICU Interventions  Hydralazine prn ordered     Intervention Category Major Interventions: Hypertension - evaluation and management  Darl Pikes 05/09/2020, 10:04 PM

## 2020-05-09 NOTE — Progress Notes (Signed)
RN removed BL ankle restraints. No longer needed. Pt remains on 0.8 precedex with a RASS -2.

## 2020-05-09 NOTE — Evaluation (Addendum)
Speech Language Pathology Evaluation Patient Details Name: Timothy Case MRN: [id number] DOB: October 22, 1955 Today's Date: 05/09/2020 Time: 2248-2500 SLP Time Calculation (min) (ACUTE ONLY): 16 min  Problem List:  Patient Active Problem List   Diagnosis Date Noted  . Contusion of face   . Subarachnoid hemorrhage following injury, with loss of consciousness (HCC)   . Agitation   . Hypokalemia   . Subdural hematoma (HCC) 05/04/2020  . Subarachnoid bleed (HCC) 05/04/2020  . Nonallopathic lesion of sacral region 05/06/2017  . Nonallopathic lesion of lumbar region 05/06/2017  . Nonallopathic lesion of thoracic region 05/06/2017  . 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 Acute encephalopathy 04/22/2012  . TIA (transient ischemic attack) 04/22/2012  . HTN (hypertension) 04/21/2012  . Delirium 04/21/2012  . Obesity 04/21/2012  . Hyperlipidemia 04/21/2012  . GERD (gastroesophageal reflux disease) 04/21/2012   Past Medical History:  Past Medical History:  Diagnosis Date  . Chronic lower back pain    "q am; cause I'm overweight" (04/21/2012)  . Essential familial hyperlipidemia   . GERD (gastroesophageal reflux disease)   . Hyperlipidemia   . Hypertension   . Shortness of breath    "lying down; sometimes" (04/21/2012)  . Stroke (HCC) 04/21/2012   "they are leaning towards mini stroke today; all S/S still gone now except little chest pain" (04/21/2012)   Past Surgical History:  Past Surgical History:  Procedure Laterality Date  . KNEE ARTHROSCOPY  ~ 2008   "both knees; ~ 6 months apart" (04/21/2012)   HPI:  Pt is a 65 y.o. male with a past medical history of CVA, HTN, HLD, GERD, and SOB who was brought into the ED for altered mental status after being found on the ground with facial trauma complaining of headache. Pt had a seizure in the ED which lasting ~2 minutes; pt was postictal and required intubation for airway protection. ETT 1/20-1/22 (self extubation). CT head  small SAH bil sylvian fissure, SAH rt occipital lobe, rt frontoparietal SDH, bifrontal hemorrhagic contusions, contusion rt cerebellum, rt occipital bone fracture.   Assessment / Plan / Recommendation Clinical Impression  Pt participated in a limited speech/language/cognition evaluation with his wife present. The evaluation was limited due to pt's lethargy and intermittent agitation, but he demonstrated periods of adequate alertness when cued by his son on Facetime. The impact of his level of alertness on his performance is strongly  considered. Pt demonstrated cognitive-linguistic deficits in the areas of attention, awareness, orientation, reasoning, orientation, and problem solving. Articulatory precision was reduced and vocal quality hoarse with reduced vocal intensity which together negative impacted speech intelligibility across all levels of verbal output. Pt communicated at the phrase and sentence level. He appeared to exhibit occasional word retrieval difficulty and did not follow any commands, despite cueing. He presented overall as a [place] level IV Confused/agitated with emerging level V confused/inappropriate non-agitated characteristics. Skilled SLP services are clinically indicated at this time to target cognitive-linguistic function and to further assess all areas.    SLP Assessment  SLP Recommendation/Assessment: Patient needs continued Speech Lanaguage Pathology Services SLP Visit Diagnosis: Cognitive communication deficit (R41.841);Dysarthria and anarthria (R47.1)    Follow Up Recommendations  Inpatient Rehab    Frequency and Duration min 2x/week  2 weeks      SLP Evaluation Cognition  Overall Cognitive Status: Impaired/Different from baseline Arousal/Alertness: Lethargic Orientation Level: Disoriented X4 Attention: Focused;Sustained Focused Attention: Impaired Focused Attention Impairment: Verbal basic Sustained Attention: Impaired Sustained Attention Impairment: Verbal  basic Awareness: Impaired Awareness Impairment: Intellectual  impairment Problem Solving: Impaired Problem Solving Impairment: Verbal basic Executive Function: Reasoning Reasoning: Impaired Reasoning Impairment: Verbal basic Behaviors: Physical agitation Safety/Judgment: Impaired Rancho Mirant Scales of Cognitive Functioning: Confused/agitated       Comprehension  Auditory Comprehension Overall Auditory Comprehension: Impaired Commands: Impaired One Step Basic Commands: 0-24% accurate Interfering Components: Processing speed    Expression Expression Primary Mode of Expression: Verbal Verbal Expression Level of Generative/Spontaneous Verbalization: Sentence   Oral / Motor  Oral Motor/Sensory Function Overall Oral Motor/Sensory Function:  (Pt unable to participate) Motor Speech Overall Motor Speech: Impaired Respiration: Within functional limits Phonation: Low vocal intensity;Hoarse Resonance: Within functional limits Articulation: Impaired Level of Impairment: Sentence Intelligibility: Intelligibility reduced Word: 50-74% accurate Phrase: 50-74% accurate Sentence: 50-74% accurate Motor Planning: Witnin functional limits Motor Speech Errors: Aware   Ruthvik Barnaby I. Vear Clock, MS, CCC-SLP Acute Rehabilitation Services Office number [phone] Pager [phone]                    Scheryl Marten 05/09/2020, 11:09 AM

## 2020-05-09 NOTE — Progress Notes (Addendum)
NAME:  Timothy Case, MRN:  [id number], DOB:  [birth year], LOS: 5 ADMISSION DATE:  05/04/2020, CONSULTATION DATE:  05/04/2020 REFERRING MD: Venetia Maxon, CHIEF COMPLAINT:  SAH   HPI/course in hospital  65 year old man presented with history of several days of severe headache. Was intact this morning initially, but wife eventually found him on the ground with facial trauma complaining of headache. Became increasingly aphasic and confused before becoming more obtunded. Was intubated for airway protection.   CT showed small frontal contusions and moderate subarachnoid blood.   05/05/2019 - CTA no aneurysm. 05/06/2019 - Repeat CT head shows improved  1/22 self extubated  Past Medical History   Past Medical History:  Diagnosis Date  . Chronic lower back pain    "q am; cause I'm overweight" (04/21/2012)  . Essential familial hyperlipidemia   . GERD (gastroesophageal reflux disease)   . Hyperlipidemia   . Hypertension   . Shortness of breath    "lying down; sometimes" (04/21/2012)  . Stroke (HCC) 04/21/2012   "they are leaning towards mini stroke today; all S/S still gone now except little chest pain" (04/21/2012)     Past Surgical History:  Procedure Laterality Date  . KNEE ARTHROSCOPY  ~ 2008   "both knees; ~ 6 months apart" (04/21/2012)    Interim history/subjective:   Off cardizem. Still confused. On precedex. tachyapenic   Objective   Blood pressure (!) 157/83, pulse 68, temperature 99.3 F (37.4 C), temperature source Oral, resp. rate (!) 38, height 5\' 9"  (1.753 m), weight 102.7 kg, SpO2 100 %.        Intake/Output Summary (Last 24 hours) at 05/09/2020 0653 Last data filed at 05/09/2020 0600 Gross per 24 hour  Intake 3327.18 ml  Output 1500 ml  Net 1827.18 ml   Filed Weights   05/06/20 0500 05/07/20 0500 05/09/20 0616  Weight: 108 kg 102.2 kg 102.7 kg    Examination: General: adult obese male, resting in bed, with stimulation he will follow commands  Neuro: he moves  spontaneously  HEENT: BL periorbital ecchymosis  Cardiovascular: RRR, no MRG  Lungs: CTAB, no crackles no wheeze, diminished breaths, in a chest belt restraint  Abdomen: BSX4, obese, soft , nt nd  Musculoskeletal: no significant edema  Skin: no rash   Assessment & Plan:   ICH, SAH and SDH, with frontal contusions of unclear etiology. Nondisplaced right occipital bone fx. Plans:  - continue keppra  - original plans are to go to CIR once stable  - cortrac to advance nutrition continue TFs   Acute agitation - 2/2 above. Acute metabolic encephalopathy secondary to above  - also possible medication effects Plan:  - shut off precedex this morning - try to get off restraints  - delirium precautions  - stop benzos - stopped scheduled haldol  - was already started on clonidine taper, can continue this as pressure allows  A.fib with RVR. - oral cardizem for rate control  - prns if needed  - repeat echo, would like to know EF before start cardizem for rate control vs bb(-)  - unable to start [facility]  Hypokalemia. - goal >4  - replete as needed   Debility  - needs continued PT /OT   This patient is critically ill with multiple organ system failure; which, requires frequent high complexity decision making, assessment, support, evaluation, and titration of therapies. This was completed through the application of advanced monitoring technologies and extensive interpretation of multiple databases. During this encounter critical care time  was devoted to patient care services described in this note for 31 minutes.  Josephine Igo, DO [facility] 05/09/2020 6:53 AM

## 2020-05-10 ENCOUNTER — Inpatient Hospital Stay (HOSPITAL_COMMUNITY): Payer: Medicare Other

## 2020-05-10 DIAGNOSIS — I609 Nontraumatic subarachnoid hemorrhage, unspecified: Secondary | ICD-10-CM | POA: Diagnosis not present

## 2020-05-10 DIAGNOSIS — R451 Restlessness and agitation: Secondary | ICD-10-CM | POA: Diagnosis not present

## 2020-05-10 DIAGNOSIS — I4891 Unspecified atrial fibrillation: Secondary | ICD-10-CM

## 2020-05-10 DIAGNOSIS — S065X9A Traumatic subdural hemorrhage with loss of consciousness of unspecified duration, initial encounter: Secondary | ICD-10-CM | POA: Diagnosis not present

## 2020-05-10 DIAGNOSIS — I160 Hypertensive urgency: Secondary | ICD-10-CM

## 2020-05-10 DIAGNOSIS — S066X9D Traumatic subarachnoid hemorrhage with loss of consciousness of unspecified duration, subsequent encounter: Secondary | ICD-10-CM | POA: Diagnosis not present

## 2020-05-10 LAB — ECHOCARDIOGRAM COMPLETE
AR max vel: 2.38 cm2
AV Area VTI: 2.53 cm2
AV Area mean vel: 2.37 cm2
AV Mean grad: 6 mmHg
AV Peak grad: 11.6 mmHg
Ao pk vel: 1.7 m/s
Area-P 1/2: 4.8 cm2
Height: 69 in
S' Lateral: 2.8 cm
Weight: 3619.07 [oz_av]

## 2020-05-10 LAB — GLUCOSE, CAPILLARY
Glucose-Capillary: 126 mg/dL — ABNORMAL HIGH (ref 70–99)
Glucose-Capillary: 152 mg/dL — ABNORMAL HIGH (ref 70–99)
Glucose-Capillary: 122 mg/dL — ABNORMAL HIGH (ref 70–99)
Glucose-Capillary: 114 mg/dL — ABNORMAL HIGH (ref 70–99)
Glucose-Capillary: 111 mg/dL — ABNORMAL HIGH (ref 70–99)
Glucose-Capillary: 119 mg/dL — ABNORMAL HIGH (ref 70–99)

## 2020-05-10 LAB — CBC WITH DIFFERENTIAL/PLATELET
Abs Immature Granulocytes: 0.05 10*3/uL (ref 0.00–0.07)
Basophils Absolute: 0 10*3/uL (ref 0.0–0.1)
Basophils Relative: 0 %
Eosinophils Absolute: 0 10*3/uL (ref 0.0–0.5)
Eosinophils Relative: 0 %
HCT: 42 % (ref 39.0–52.0)
Hemoglobin: 14.1 g/dL (ref 13.0–17.0)
Immature Granulocytes: 1 %
Lymphocytes Relative: 13 %
Lymphs Abs: 1.4 10*3/uL (ref 0.7–4.0)
MCH: 30.9 pg (ref 26.0–34.0)
MCHC: 33.6 g/dL (ref 30.0–36.0)
MCV: 91.9 fL (ref 80.0–100.0)
Monocytes Absolute: 1.6 10*3/uL — ABNORMAL HIGH (ref 0.1–1.0)
Monocytes Relative: 15 %
Neutro Abs: 7.9 10*3/uL — ABNORMAL HIGH (ref 1.7–7.7)
Neutrophils Relative %: 71 %
Platelets: 168 10*3/uL (ref 150–400)
RBC: 4.57 MIL/uL (ref 4.22–5.81)
RDW: 13.2 % (ref 11.5–15.5)
WBC: 11 10*3/uL — ABNORMAL HIGH (ref 4.0–10.5)
nRBC: 0 % (ref 0.0–0.2)

## 2020-05-10 LAB — BASIC METABOLIC PANEL
Anion gap: 11 (ref 5–15)
BUN: 17 mg/dL (ref 8–23)
CO2: 23 mmol/L (ref 22–32)
Calcium: 8.9 mg/dL (ref 8.9–10.3)
Chloride: 103 mmol/L (ref 98–111)
Creatinine, Ser: 0.8 mg/dL (ref 0.61–1.24)
GFR, Estimated: >60 mL/min (ref >60)
Glucose, Bld: 136 mg/dL — ABNORMAL HIGH (ref 70–99)
Potassium: 3.2 mmol/L — ABNORMAL LOW (ref 3.5–5.1)
Sodium: 137 mmol/L (ref 135–145)

## 2020-05-10 LAB — PHOSPHORUS
Phosphorus: 2.9 mg/dL (ref 2.5–4.6)
Phosphorus: 3.5 mg/dL (ref 2.5–4.6)

## 2020-05-10 LAB — MAGNESIUM
Magnesium: 1.8 mg/dL (ref 1.7–2.4)
Magnesium: 2 mg/dL (ref 1.7–2.4)

## 2020-05-10 MED ORDER — CARVEDILOL 25 MG PO TABS
25.0000 mg | ORAL_TABLET | Freq: Two times a day (BID) | ORAL | Status: DC
  Administered 2020-05-10 – 2020-05-11 (×2): 25 mg
  Filled 2020-05-10 (×2): qty 1

## 2020-05-10 MED ORDER — POLYETHYLENE GLYCOL 3350 17 G PO PACK
17.0000 g | PACK | Freq: Four times a day (QID) | ORAL | Status: DC
  Administered 2020-05-10 (×2): 17 g
  Filled 2020-05-10 (×2): qty 1

## 2020-05-10 MED ORDER — MAGNESIUM CITRATE PO SOLN
1.0000 | Freq: Once | ORAL | Status: AC
  Administered 2020-05-10: 1 via ORAL
  Filled 2020-05-10: qty 296

## 2020-05-10 MED ORDER — SALINE SPRAY 0.65 % NA SOLN
1.0000 | NASAL | Status: DC | PRN
  Filled 2020-05-10 (×2): qty 44

## 2020-05-10 MED ORDER — LABETALOL HCL 5 MG/ML IV SOLN
20.0000 mg | INTRAVENOUS | Status: DC | PRN
  Administered 2020-05-11: 20 mg via INTRAVENOUS
  Filled 2020-05-10: qty 4

## 2020-05-10 MED ORDER — THIAMINE HCL 100 MG PO TABS
100.0000 mg | ORAL_TABLET | Freq: Every day | ORAL | Status: DC
  Administered 2020-05-10 – 2020-05-11 (×2): 100 mg
  Filled 2020-05-10 (×2): qty 1

## 2020-05-10 MED ORDER — BISACODYL 10 MG RE SUPP
10.0000 mg | Freq: Once | RECTAL | Status: AC
  Administered 2020-05-10: 10 mg via RECTAL
  Filled 2020-05-10: qty 1

## 2020-05-10 MED ORDER — ADULT MULTIVITAMIN LIQUID CH
15.0000 mL | Freq: Every day | ORAL | Status: DC
  Administered 2020-05-10 – 2020-05-11 (×2): 15 mL
  Filled 2020-05-10 (×3): qty 15

## 2020-05-10 MED ORDER — AMLODIPINE BESYLATE 5 MG PO TABS
5.0000 mg | ORAL_TABLET | Freq: Every day | ORAL | Status: DC
  Administered 2020-05-10 – 2020-05-11 (×2): 5 mg via ORAL
  Filled 2020-05-10 (×2): qty 1

## 2020-05-10 MED ORDER — MELATONIN 5 MG PO TABS
5.0000 mg | ORAL_TABLET | Freq: Every evening | ORAL | Status: DC | PRN
  Administered 2020-05-11: 5 mg via ORAL
  Filled 2020-05-10: qty 1

## 2020-05-10 NOTE — Progress Notes (Signed)
Occupational Therapy Treatment Patient Details Name: Timothy Case MRN: [id number] DOB: 01-23-1956 Today's Date: 05/10/2020    History of present illness 65 year old man presented 05/04/20 with history of several days of severe headache. Wife found him on the ground with facial trauma complaining of headache. Became increasingly aphasic, confused, and then obtunded. Had seizure in ED. Was intubated for airway protection. CT head small SAH bil sylvian fissure, SAH rt occipital lobe, rt frontoparietal SDH, bifrontal hemorrhagic contusions, contusion rt cerebellum, rt occipital bone fracture;  1/22 self-extubated; agitation requiring sedation; afib with RVR  PMH-low back pain, HTN, CVA   OT comments  Pt demonstrates behavior consistent with Rancho Coma recovery (confused inappropriate non agitated) and emerging VI. Son reports that patient is asking him about cancelling flights for a pending trip that was scheduled next week and recalling information. Pt attempting to don socks but needs (A) due to fine motor deficits. 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 Pt redirected easily during session by son. Recommendation for CIR  Follow Up Recommendations  CIR    Equipment Recommendations  3 in 1 bedside commode;Other (comment) [facility])    Recommendations for Other Services Rehab consult    Precautions / Restrictions Precautions Precautions: Fall;Other (comment) Precaution Comments: periods of agitation Restrictions Weight Bearing Restrictions: No       Mobility Bed Mobility Overal bed mobility: Needs Assistance Bed Mobility: Rolling;Supine to Sit;Sit to Supine Rolling: Mod assist   Supine to sit: Mod assist     General bed mobility comments: pt exiting on R side with hand over hand to help reach for bed rail and max cues to sit up with R UE  Transfers Overall transfer level: Needs assistance Equipment used: 2 person hand held assist Transfers: Sit to/from  Stand Sit to Stand: +2 physical assistance;Min assist         General transfer comment: pt requires total +2 mod (A) for transfer with widen base of support    Balance Overall balance assessment: Needs assistance Sitting-balance support: Bilateral upper extremity supported;Feet supported Sitting balance-Leahy Scale: Poor     Standing balance support: Bilateral upper extremity supported;During functional activity Standing balance-Leahy Scale: Poor                 High Level Balance Comments: pt with widen base of support and reaching for environment           ADL either performed or assessed with clinical judgement   ADL Overall ADL's : Needs assistance/impaired Eating/Feeding: NPO                   Lower Body Dressing: Maximal assistance;Sitting/lateral leans Lower Body Dressing Details (indicate cue type and reason): able to figure 4 cross bil sides, pt requires sock partially don on toes and able to pull up with cues for visually attend to task Toilet Transfer: +2 for physical assistance;Moderate assistance;Ambulation;BSC Toilet Transfer Details (indicate cue type and reason): simulated EOB to chair         Functional mobility during ADLs: +2 for physical assistance;Moderate assistance General ADL Comments: pt pulling at mittens and give verbal cues to stop and that therapy would him him. pt states okay and stops. pt within 20 seconds begins again due to poor recall. Pt oriented to location and place adn unable to recall information. pt states"no" when educated on "you are at [employer] cone" pt states no "[place]"     Vision  Perception     Praxis      Cognition Arousal/Alertness: Awake/alert Behavior During Therapy: Flat affect Overall Cognitive Status: Impaired/Different from baseline Area of Impairment: Orientation;Attention;Memory;Following commands;Safety/judgement;Awareness;Problem solving;Rancho level               Rancho Levels of  Cognitive Functioning Rancho Los Amigos Scales of Cognitive Functioning: Confused/inappropriate/non-agitated (emerginig VI confused appropriate) Orientation Level: Disoriented to;Time;Situation;Place (reports at hospital in [facility]- has a beach house there.) Current Attention Level: Sustained Memory: Decreased recall of precautions;Decreased short-term memory Following Commands: Follows one step commands with increased time;Follows one step commands consistently Safety/Judgement: Decreased awareness of safety;Decreased awareness of deficits Awareness: Intellectual Problem Solving: Slow processing;Difficulty sequencing;Decreased initiation General Comments: Pt simple commands, pt asking for small piece of wood. son present reports pt frequenctly uses tooth pick and he is currently rubbing his tongue and teeth. so the statement is related to need.        Exercises     Shoulder Instructions       General Comments VSS- BP 144/78 HR 60-70s RA    Pertinent Vitals/ Pain       Pain Assessment: Faces Faces Pain Scale: Hurts a little bit Pain Location: overall generalizations Pain Descriptors / Indicators: Grimacing Pain Intervention(s): Monitored during session;Repositioned  Home Living                                          Prior Functioning/Environment              Frequency  Min 2X/week        Progress Toward Goals  OT Goals(current goals can now be found in the care plan section)  Progress towards OT goals: Progressing toward goals  Acute Rehab OT Goals Patient Stated Goal: wants to go home OT Goal Formulation: Patient unable to participate in goal setting Potential to Achieve Goals: Fair ADL Goals Pt Will Perform Grooming: with mod assist;sitting Pt Will Transfer to Toilet: stand pivot transfer;with mod assist;bedside commode Additional ADL Goal #1: pt will follow 2 step command 50% of attempts Additional ADL Goal #2: pt will visually attend to  adl task for 25 seconds Additional ADL Goal #3: Pt will sustain static sitting min (A) as precursor to adls.  Plan Discharge plan remains appropriate    Co-evaluation    PT/OT/SLP Co-Evaluation/Treatment: Yes Reason for Co-Treatment: To address functional/ADL transfers;Necessary to address cognition/behavior during functional activity;For patient/therapist safety   OT goals addressed during session: ADL's and self-care;Proper use of Adaptive equipment and DME;Strengthening/ROM      AM-PAC OT "6 Clicks" Daily Activity     Outcome Measure   Help from another person eating meals?: A Lot Help from another person taking care of personal grooming?: A Lot Help from another person toileting, which includes using toliet, bedpan, or urinal?: A Lot Help from another person bathing (including washing, rinsing, drying)?: Total Help from another person to put on and taking off regular upper body clothing?: A Lot Help from another person to put on and taking off regular lower body clothing?: Total 6 Click Score: 10    End of Session Equipment Utilized During Treatment: Gait belt  OT Visit Diagnosis: Unsteadiness on feet (R26.81)   Activity Tolerance Patient tolerated treatment well   Patient Left with call bell/phone within reach;with restraints reapplied;in chair;with chair alarm set;with family/visitor present   Nurse Communication Mobility status;Weight bearing status  Time: 9449-6759 OT Time Calculation (min): 39 min  Charges: OT General Charges $OT Visit: 1 Visit OT Treatments $Self Care/Home Management : 23-37 mins   Brynn, OTR/L  Acute Rehabilitation Services Pager: [phone] Office: [phone] .    Mateo Flow 05/10/2020, 10:21 AM

## 2020-05-10 NOTE — Progress Notes (Signed)
eLink Physician-Brief Progress Note Patient Name: Timothy Case DOB: 06-25-1955 MRN: [id number]   Date of Service  05/10/2020  HPI/Events of Note  Patient with nasal congestion.  eICU Interventions  Nasal saline ordered as decongestant, patient admitted with ICH contraindicating nasal agents that can potentially elevate BP.        Thomasene Lot Branae Crail 05/10/2020, 9:02 PM

## 2020-05-10 NOTE — Progress Notes (Signed)
Assisted tele visit to patient with son.  Suri Tafolla D Dannell Raczkowski, RN   

## 2020-05-10 NOTE — Progress Notes (Signed)
  Echocardiogram 2D Echocardiogram has been performed.  [name] 05/10/2020, 3:12 PM

## 2020-05-10 NOTE — Progress Notes (Signed)
Subjective: Patient reports that he is ready to go home. He is alert and oriented to person and much more interactive this morning. No acute events reported overnight.   Objective: Vital signs in last 24 hours: Temp:  [97.6 F (36.4 C)-100.1 F (37.8 C)] 97.6 F (36.4 C) (01/26 0348) Pulse Rate:  [55-117] 68 (01/26 0800) Resp:  [18-40] 31 (01/26 0800) BP: (134-182)/(72-101) 178/93 (01/26 0800) SpO2:  [94 %-100 %] 98 % (01/26 0800) Weight:  [102.6 kg] 102.6 kg (01/26 0500)  Intake/Output from previous day: 01/25 0701 - 01/26 0700 In: 268.7 [I.V.:103.7; NG/GT:165] Out: 1400 [Urine:1400] Intake/Output this shift: No intake/output data recorded.  Physical Exam: Overall improvement in neuro exam from yesterday morning. Patient remains confused but is alert and oriented to self. He makes good eye contact that is sustained.He is able to follow simple commands. Speech remains dysarthric but has slightly improved from yesterday.Much more conversant this morning. Pupils 4 mmround and brisk. MAEW with good strength. MAEW with good strength that is symmetric bilaterally. 5/5 BUE/BLE.    Lab Results: Recent Labs    05/09/20 0947 05/10/20 0712  WBC 9.3 11.0*  HGB 13.8 14.1  HCT 40.1 42.0  PLT 149* 168   BMET Recent Labs    05/09/20 0804 05/10/20 0712  NA 140 137  K 4.0 3.2*  CL 106 103  CO2 24 23  GLUCOSE 150* 136*  BUN 21 17  CREATININE 0.89 0.80  CALCIUM 8.5* 8.9    Studies/Results: No results found.  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 Patient continues to make improvements in his neurological exam. He is much more alert and conversant this morning. Continue supportive care per CCM. Plan for CIR once appropriate. No new neurosurgical recommendations at this time. Will continue to follow.   LOS: 6 days     Council Mechanic, DNP, NP-C 05/10/2020, 9:02 AM

## 2020-05-10 NOTE — Progress Notes (Signed)
Physical Therapy Treatment Patient Details Name: Timothy Case MRN: [id number] DOB: Sep 25, 1955 Today's Date: 05/10/2020    History of Present Illness 65 year old man presented 05/04/20 with history of several days of severe headache. Wife found him on the ground with facial trauma complaining of headache. Became increasingly aphasic, confused, and then obtunded. Had seizure in ED. Was intubated for airway protection. CT head small SAH bil sylvian fissure, SAH rt occipital lobe, rt frontoparietal SDH, bifrontal hemorrhagic contusions, contusion rt cerebellum, rt occipital bone fracture;  1/22 self-extubated; agitation requiring sedation; afib with RVR  PMH-low back pain, HTN, CVA    PT Comments    Patient remains sleepy, however easily aroused to attend to tasks. Son present throughout session. Patient following simple commands and with no signs of agitation (even when he wanted to return to bed and we encouraged return to chair to sit up). Currently Rancho level V (confused, inappropriate, non-agitated). Patient endorses dizziness with ambulation and BP noted to be stable--likely due to cerebellar contusions.     Follow Up Recommendations  CIR     Equipment Recommendations  Rolling walker with 5" wheels    Recommendations for Other Services       Precautions / Restrictions Precautions Precautions: Fall;Other (comment) Precaution Comments: periods of agitation Restrictions Weight Bearing Restrictions: No    Mobility  Bed Mobility Overal bed mobility: Needs Assistance Bed Mobility: Rolling;Supine to Sit Rolling: Mod assist   Supine to sit: Mod assist     General bed mobility comments: pt exiting on R side with hand over hand to help reach for bed rail and max cues to sit up with R UE  Transfers Overall transfer level: Needs assistance Equipment used: 2 person hand held assist Transfers: Sit to/from Stand Sit to Stand: +2 physical assistance;Min assist          General transfer comment: pt requires total +2 mod (A) for transfer with widen base of support  Ambulation/Gait Ambulation/Gait assistance: Mod assist;+2 physical assistance;+2 safety/equipment Gait Distance (Feet): 9 Feet Assistive device: 2 person hand held assist Gait Pattern/deviations: Step-to pattern;Decreased stride length;Wide base of support     General Gait Details: very wide stance and barely moved legs closer together with cues; reporting dizziness and desire to return to bed, however able to persuade to go to the chair   Stairs             Wheelchair Mobility    Modified Rankin (Stroke Patients Only)       Balance Overall balance assessment: Needs assistance Sitting-balance support: Bilateral upper extremity supported;Feet supported Sitting balance-Leahy Scale: Poor     Standing balance support: Bilateral upper extremity supported;During functional activity Standing balance-Leahy Scale: Poor                 High Level Balance Comments: pt with widen base of support and reaching for environment            Cognition Arousal/Alertness: Awake/alert Behavior During Therapy: Flat affect Overall Cognitive Status: Impaired/Different from baseline Area of Impairment: Orientation;Attention;Memory;Following commands;Safety/judgement;Awareness;Problem solving;Rancho level               Rancho Levels of Cognitive Functioning Rancho Los Amigos Scales of Cognitive Functioning: Confused/inappropriate/non-agitated (emerginig VI confused appropriate) Orientation Level: Disoriented to;Time;Situation;Place (reports at hospital in [facility]- has a beach house there.) Current Attention Level: Sustained Memory: Decreased recall of precautions;Decreased short-term memory Following Commands: Follows one step commands with increased time;Follows one step commands consistently Safety/Judgement: Decreased awareness of  safety;Decreased awareness of  deficits Awareness: Intellectual Problem Solving: Slow processing;Difficulty sequencing;Decreased initiation General Comments: Pt followign simple commands, pt asking for small piece of wood. son present reports pt frequenctly uses tooth pick and he is currently rubbing his tongue and teeth. so the statement is related to need.      Exercises      General Comments General comments (skin integrity, edema, etc.): VSS- BP 144/78 HR 60-70s RA      Pertinent Vitals/Pain Pain Assessment: Faces Faces Pain Scale: Hurts a little bit Pain Location: overall generalizations Pain Descriptors / Indicators: Grimacing Pain Intervention(s): Limited activity within patient's tolerance;Monitored during session;Repositioned    Home Living                      Prior Function            PT Goals (current goals can now be found in the care plan section) Acute Rehab PT Goals Patient Stated Goal: wants to go home Time For Goal Achievement: 05/22/20 Potential to Achieve Goals: Good Progress towards PT goals: Progressing toward goals    Frequency    Min 3X/week      PT Plan Current plan remains appropriate    Co-evaluation PT/OT/SLP Co-Evaluation/Treatment: Yes Reason for Co-Treatment: Necessary to address cognition/behavior during functional activity;For patient/therapist safety;To address functional/ADL transfers PT goals addressed during session: Mobility/safety with mobility;Balance OT goals addressed during session: ADL's and self-care;Proper use of Adaptive equipment and DME;Strengthening/ROM      AM-PAC PT "6 Clicks" Mobility   Outcome Measure  Help needed turning from your back to your side while in a flat bed without using bedrails?: A Lot Help needed moving from lying on your back to sitting on the side of a flat bed without using bedrails?: A Lot Help needed moving to and from a bed to a chair (including a wheelchair)?: Total Help needed standing up from a chair  using your arms (e.g., wheelchair or bedside chair)?: A Little Help needed to walk in hospital room?: Total Help needed climbing 3-5 steps with a railing? : Total 6 Click Score: 10    End of Session Equipment Utilized During Treatment: Gait belt Activity Tolerance: Patient tolerated treatment well Patient left: in chair;with call bell/phone within reach;with chair alarm set;with family/visitor present;with restraints reapplied (bil mitts and wrist retraints) Nurse Communication: Mobility status PT Visit Diagnosis: Other symptoms and signs involving the nervous system (D53.299)     Time: 2426-8341 PT Time Calculation (min) (ACUTE ONLY): 38 min  Charges:  $Therapeutic Activity: 8-22 mins                      Jerolyn Center, PT Pager [phone]    Zena Amos 05/10/2020, 10:39 AM

## 2020-05-10 NOTE — Progress Notes (Addendum)
  Speech Language Pathology Treatment: Cognitive-Linquistic;Dysphagia  Patient Details Name: Timothy Case MRN: [id number] DOB: 1956/02/09 Today's Date: 05/10/2020 Time: 8546-2703 SLP Time Calculation (min) (ACUTE ONLY): 39 min  Assessment / Plan / Recommendation Clinical Impression  Patient seen for cognitive treatment, focus on facilitation of recovery through the Rancho Levels of cognitive function s/p TBI. Patient alert today, sitting upright in chair, conversant with SLP. Patient able to sustain attention to basic familiar tasks for 15 min plus without cueing although consistently impulsive requiring mod-max cues cues for safety awareness (to slow rate (eating), use correct tools for task (brushing teeth), etc). Patient oriented to person and place only, requiring SLP assistance for orientation to situation. During session today, patient presenting with behaviors consistent with a Rancho V (confused inappropriate) with emerging Rancho VI (confused appropriate) behaviors and will benefit from continued SLP f/u. Son educated on strategies and progression through [employer].   Patient much more alert today for diagnostic po trials as compared with initial evaluation. Patient able to self feed thin liquids, pureed solids, and regular texture solids without subtle signs of aspiration. Intermittent throat clearing noted however SLP noted this prior to consuming pos and patient's son reports this is baseline for patient. Despite this however, cannot r/o impact of neuro function or 2 day intubation of pharyngeal swallowing function. Impulsivity with po intake also increases aspiration risk. Recommend initiation of a po diet with SLP f/u for tolerance and compensatory strategies as needed. Patient will need full supervision for cueing, particularly to slow rate of intake. May consider leaving Cortrak in place for today to ensure that patient is able to maintain adequate level of alertness for  nutritional needs and medication intake.    Note that after session, patient with increased agitation regarding desire to return home. Not abnormal for patient to fluctuate between a Level V and VI as he is improving.    HPI HPI: Pt is a 65 y.o. male with a past medical history of CVA, HTN, HLD, GERD, and SOB who was brought into the ED for altered mental status after being found on the ground with facial trauma complaining of headache. Pt had a seizure in the ED which lasting ~2 minutes; pt was postictal and required intubation for airway protection. ETT 1/20-1/22 (self extubation). CT head small SAH bil sylvian fissure, SAH rt occipital lobe, rt frontoparietal SDH, bifrontal hemorrhagic contusions, contusion rt cerebellum, rt occipital bone fracture.                         General recommendations: Rehab consult Oral Care Recommendations: Oral care QID Follow up Recommendations: Inpatient Rehab SLP Visit Diagnosis: Cognitive communication deficit (R41.841);Dysarthria and anarthria (R47.1) Plan: Continue with current plan of care       [facility] MA, CCC-SLP                 Timothy Case 05/10/2020, 12:00 PM

## 2020-05-10 NOTE — Progress Notes (Signed)
   NAME:  Timothy Case, MRN:  [id number], DOB:  1955/12/25, LOS: 6 ADMISSION DATE:  05/04/2020, CONSULTATION DATE:  05/04/2020 REFERRING MD: Venetia Maxon, CHIEF COMPLAINT:  SAH   HPI/course in hospital  65 year old man presented with history of several days of severe headache. Was intact this morning initially, but wife eventually found him on the ground with facial trauma complaining of headache. Became increasingly aphasic and confused before becoming more obtunded. Was intubated for airway protection.   CT showed small frontal contusions and moderate subarachnoid blood.   05/05/2019 - CTA no aneurysm. 05/06/2019 - Repeat CT head shows improved  1/22 self extubated  Past Medical History   Past Medical History:  Diagnosis Date  . Chronic lower back pain    "q am; cause I'm overweight" (04/21/2012)  . Essential familial hyperlipidemia   . GERD (gastroesophageal reflux disease)   . Hyperlipidemia   . Hypertension   . Shortness of breath    "lying down; sometimes" (04/21/2012)  . Stroke (HCC) 04/21/2012   "they are leaning towards mini stroke today; all S/S still gone now except little chest pain" (04/21/2012)     Past Surgical History:  Procedure Laterality Date  . KNEE ARTHROSCOPY  ~ 2008   "both knees; ~ 6 months apart" (04/21/2012)    Interim history/subjective:   Alert following commands this morning. Asking to go home.   Objective   Blood pressure (!) 175/89, pulse 73, temperature 97.6 F (36.4 C), temperature source Oral, resp. rate 18, height 5\' 9"  (1.753 m), weight 102.6 kg, SpO2 94 %.        Intake/Output Summary (Last 24 hours) at 05/10/2020 0739 Last data filed at 05/10/2020 0700 Gross per 24 hour  Intake 203.24 ml  Output 1400 ml  Net -1196.76 ml   Filed Weights   05/07/20 0500 05/09/20 0616 05/10/20 0500  Weight: 102.2 kg 102.7 kg 102.6 kg    Examination: General: adult obese male, resting in bed Neuro: follows commands, moves all 4  HEENT: BL  periorbital ecchymosis  Cardiovascular: RRR, no mrg  Lungs: CTAB, no wheeze  Abdomen: BS present, soft nt nd  Musculoskeletal: no edema  Skin: no rash   Assessment & Plan:   ICH, SAH and SDH, with frontal contusions of unclear etiology. Nondisplaced right occipital bone fx. Plans:  - continue keppra - working with PT/OT  - hopefully a good candidate for CIR   Acute agitation - 2/2 above. Acute metabolic encephalopathy secondary to above  - also possible medication effects Plan:  Avoid benzos  Delirium precautions  Reorientation  Mobility   A.fib with RVR. - stopped oral cardizem  - started coreg because we need BP and rate control  - PRN labetalol  - ECHO pending  - holding AC   Hypertension - started coreg  - started amlodipine  - PRN labetalol and hydralazine   Hypokalemia. - replete as needed  Debility  - PT/ OT  - may do well with CIR   Constipation - Needs BM  - mag citrate X1  - miralax QID until BM   05/12/20, DO [facility] 05/10/2020 7:39 AM

## 2020-05-10 NOTE — Progress Notes (Signed)
Pt attempting to get OOB. Managed to slide down to foot of bed and slide OOB. No fall but sat down on floor when couldn't escape his monitor wires and cables. Pt attempting to get to bathroom despite repeated requests to call nurse before getting OOB. Pt verbalized not to get OOB without nurse present during previous education. Pt alert and oriented X4 but very impulsive. Pt denies pain and was able to stand up with 2 nurse assist and gait belt and sit on side of bed. Pt educated on fall risk and use of call bell. Pt verbalizes understanding and will call before getting OOB. RN and staff will monitor patient closely

## 2020-05-10 NOTE — Progress Notes (Signed)
Dr. Tonia Brooms informed of patient's BP 182/96 despite IV Hydralazine; see MAR for orders.

## 2020-05-10 NOTE — Progress Notes (Signed)
Inpatient Rehab Admissions Coordinator:    I spoke with pt.'s son, Weston Brass, who requested that Pt. Be allowed to have 2 rotating visitors while on CIR. I reviewed case with Rehab MD, Dr. Allena Katz, and he approved their request. Pt. May have 2 visitors (not simultaneously) while on CIR. Pt. With ECHO pending for today, but may be ready for CIR later this week.  Megan Salon, MS, CCC-SLP Rehab Admissions Coordinator  [phone] (celll) [phone] (office)

## 2020-05-11 DIAGNOSIS — I609 Nontraumatic subarachnoid hemorrhage, unspecified: Secondary | ICD-10-CM | POA: Diagnosis not present

## 2020-05-11 DIAGNOSIS — S065X9A Traumatic subdural hemorrhage with loss of consciousness of unspecified duration, initial encounter: Secondary | ICD-10-CM | POA: Diagnosis not present

## 2020-05-11 DIAGNOSIS — S0083XA Contusion of other part of head, initial encounter: Secondary | ICD-10-CM | POA: Diagnosis not present

## 2020-05-11 DIAGNOSIS — S066X9D Traumatic subarachnoid hemorrhage with loss of consciousness of unspecified duration, subsequent encounter: Secondary | ICD-10-CM | POA: Diagnosis not present

## 2020-05-11 LAB — CBC WITH DIFFERENTIAL/PLATELET
Abs Immature Granulocytes: 0.06 10*3/uL (ref 0.00–0.07)
Basophils Absolute: 0 10*3/uL (ref 0.0–0.1)
Basophils Relative: 0 %
Eosinophils Absolute: 0 10*3/uL (ref 0.0–0.5)
Eosinophils Relative: 0 %
HCT: 44.5 % (ref 39.0–52.0)
Hemoglobin: 14.7 g/dL (ref 13.0–17.0)
Immature Granulocytes: 1 %
Lymphocytes Relative: 12 %
Lymphs Abs: 1.3 10*3/uL (ref 0.7–4.0)
MCH: 30.6 pg (ref 26.0–34.0)
MCHC: 33 g/dL (ref 30.0–36.0)
MCV: 92.7 fL (ref 80.0–100.0)
Monocytes Absolute: 1.6 10*3/uL — ABNORMAL HIGH (ref 0.1–1.0)
Monocytes Relative: 14 %
Neutro Abs: 8.2 10*3/uL — ABNORMAL HIGH (ref 1.7–7.7)
Neutrophils Relative %: 73 %
Platelets: 209 10*3/uL (ref 150–400)
RBC: 4.8 MIL/uL (ref 4.22–5.81)
RDW: 13.4 % (ref 11.5–15.5)
WBC: 11.2 10*3/uL — ABNORMAL HIGH (ref 4.0–10.5)
nRBC: 0 % (ref 0.0–0.2)

## 2020-05-11 LAB — BASIC METABOLIC PANEL
Anion gap: 11 (ref 5–15)
BUN: 22 mg/dL (ref 8–23)
CO2: 25 mmol/L (ref 22–32)
Calcium: 9 mg/dL (ref 8.9–10.3)
Chloride: 102 mmol/L (ref 98–111)
Creatinine, Ser: 0.8 mg/dL (ref 0.61–1.24)
GFR, Estimated: >60 mL/min (ref >60)
Glucose, Bld: 121 mg/dL — ABNORMAL HIGH (ref 70–99)
Potassium: 3.5 mmol/L (ref 3.5–5.1)
Sodium: 138 mmol/L (ref 135–145)

## 2020-05-11 LAB — MAGNESIUM
Magnesium: 1.9 mg/dL (ref 1.7–2.4)
Magnesium: 2 mg/dL (ref 1.7–2.4)

## 2020-05-11 LAB — GLUCOSE, CAPILLARY
Glucose-Capillary: 121 mg/dL — ABNORMAL HIGH (ref 70–99)
Glucose-Capillary: 124 mg/dL — ABNORMAL HIGH (ref 70–99)
Glucose-Capillary: 152 mg/dL — ABNORMAL HIGH (ref 70–99)
Glucose-Capillary: 143 mg/dL — ABNORMAL HIGH (ref 70–99)
Glucose-Capillary: 143 mg/dL — ABNORMAL HIGH (ref 70–99)
Glucose-Capillary: 135 mg/dL — ABNORMAL HIGH (ref 70–99)

## 2020-05-11 LAB — PHOSPHORUS
Phosphorus: 3.9 mg/dL (ref 2.5–4.6)
Phosphorus: 4.2 mg/dL (ref 2.5–4.6)

## 2020-05-11 MED ORDER — AMLODIPINE BESYLATE 10 MG PO TABS
10.0000 mg | ORAL_TABLET | Freq: Every day | ORAL | Status: DC
  Administered 2020-05-12: 10 mg via ORAL
  Filled 2020-05-11: qty 1

## 2020-05-11 MED ORDER — AMLODIPINE BESYLATE 5 MG PO TABS
5.0000 mg | ORAL_TABLET | Freq: Every day | ORAL | Status: AC
  Administered 2020-05-11: 5 mg via ORAL
  Filled 2020-05-11: qty 1

## 2020-05-11 MED ORDER — CARVEDILOL 25 MG PO TABS
25.0000 mg | ORAL_TABLET | Freq: Two times a day (BID) | ORAL | Status: DC
Start: 2020-05-11 — End: 2020-05-12
  Administered 2020-05-11 – 2020-05-12 (×2): 25 mg via ORAL
  Filled 2020-05-11 (×2): qty 1

## 2020-05-11 MED ORDER — OXYCODONE HCL 5 MG PO TABS
5.0000 mg | ORAL_TABLET | ORAL | Status: DC | PRN
  Administered 2020-05-11 – 2020-05-12 (×3): 5 mg via ORAL
  Filled 2020-05-11 (×3): qty 1

## 2020-05-11 MED ORDER — THIAMINE HCL 100 MG PO TABS
100.0000 mg | ORAL_TABLET | Freq: Every day | ORAL | Status: DC
  Administered 2020-05-12: 100 mg via ORAL
  Filled 2020-05-11: qty 1

## 2020-05-11 MED ORDER — DOCUSATE SODIUM 100 MG PO CAPS
100.0000 mg | ORAL_CAPSULE | Freq: Two times a day (BID) | ORAL | Status: DC
  Administered 2020-05-12: 100 mg via ORAL
  Filled 2020-05-11: qty 1

## 2020-05-11 MED ORDER — ENSURE ENLIVE PO LIQD
237.0000 mL | Freq: Two times a day (BID) | ORAL | Status: DC
  Administered 2020-05-12 (×2): 237 mL via ORAL

## 2020-05-11 MED ORDER — POLYETHYLENE GLYCOL 3350 17 G PO PACK: 17.0000 g | PACK | Freq: Four times a day (QID) | ORAL | Status: DC

## 2020-05-11 NOTE — PMR Pre-admission (Signed)
PMR Admission Coordinator Pre-Admission Assessment  Patient: Timothy Case is an 65 y.o., male MRN: [id number] DOB: Feb 25, 1956 Height: '5\' 9"'  (175.3 cm) Weight: 103.7 kg              Insurance Information HMO:     PPO:      PCP:      IPA:      80/20: yes     OTHER: PRIMARY: Medicare A and B      Policy#: [id number]      Subscriber: patient CM Name:       Phone#:      Fax#:  Pre-Cert#:       Employer:  Benefits:  Phone #: online     Name: verified eligibility via [place] on 05/11/20 Eff. Date: Part A effective 11/14/2018 and B effective 04/16/2019    Deduct: $1,556      Out of Pocket Max:       Life Max:   CIR: Covered per Medicare guidelines once yearly deductible has been met      SNF: days 1-20, 100%, days 21-100, 80% Outpatient: 80%     Co-Pay: 20% Home Health: 100%      Co-Pay:  DME: 80%     Co-Pay: 20% Providers: Pt's choice SECONDARY: [employer]      Policy#: [id number]      Phone#: [phone]  Financial Counselor:       Phone#:   The Data Collection Information Summary for patients in Inpatient Rehabilitation Facilities with attached Privacy Act [place] Records was provided and verbally reviewed with: Family  Emergency Contact Information Contact Information    Name Relation Home Work Mobile   [place] Spouse [phone]  [phone]   Duford,Nicholas Son [phone]  [phone]     Current Medical History  Patient Admitting Diagnosis: TBI   History of Present Illness: Timothy Case is a 65 year old right-handed male with history of CVA maintained on aspirin, Covid infection in March and then again in December 2021, hypertension, hyperlipidemia, history of alcohol use.  Presented 05/04/2020 after being found down by his wife with altered mental status and aphasia.  Wife had reported recent headache.  Admission chemistries unremarkable except BUN 28 glucose 131 SARS coronavirus positive, urine culture no growth.   Cranial CT scan showed bilateral subdural hematomas without midline shift.  Diffuse subarachnoid hemorrhage predominantly in the sylvian fissures with sparing of the basilar cisterns.  There were bifrontal hemorrhagic contusions.  Negative for facial fracture negative cervical spine.  CTA of head and neck no vascular malformation.  Follow-up CT scan of the head expected blooming of hemorrhagic contusions in the bilateral inferior frontal lobes and right cerebellum.  Traumatic pattern subarachnoid hemorrhage with either increased or diffusion since prior tracing.  Slight increase in right frontal parietal subdural hematoma which measures up to 7 mm in thickness.  No hydrocephalus.  There were findings of nondisplaced right occipital bone fracture.  EEG negative for seizure maintained on Keppra for seizure prophylaxis.  Neurosurgery follow-up Dr. Vertell Limber as well as neurology services consulted advised conservative care.  Patient was cleared to initiate subcutaneous heparin for DVT prophylaxis.  Hospital course patient did require intubation for airway protection self extubated 05/06/2020.  Patient with bouts of atrial fibrillation RVR placed on intravenous Cardizem and later discontinued and currently maintained on Coreg.  Echocardiogram was completed showing ejection fraction of 60 to 50% grade 2 diastolic dysfunction.  In regards to patient's Covid positive testing follow-up rapid antigen test was negative it was  felt that Covid positive PCR test indicated dead virus shedding rather than active infection and isolation discontinued.  He did have bouts of agitation initially requiring Precedex as well as Haldol.  His diet was advanced to a regular consistency however he still was receiving nasogastric tube feeds for nutritional support..  Therapy evaluations completed due to patient decreased functional mobility cognitive impairment is to be admitted for a comprehensive rehab program on 05/12/20.  Complete NIHSS TOTAL:  7 Glasgow Coma Scale Score: 15  Past Medical History  Past Medical History:  Diagnosis Date   Chronic lower back pain    "q am; cause I'm overweight" (04/21/2012)   Essential familial hyperlipidemia    GERD (gastroesophageal reflux disease)    Hyperlipidemia    Hypertension    Shortness of breath    "lying down; sometimes" (04/21/2012)   Stroke ([place]) 04/21/2012   "they are leaning towards mini stroke today; all S/S still gone now except little chest pain" (04/21/2012)    Family History  family history is not on file.  Prior Rehab/Hospitalizations:  Has the patient had prior rehab or hospitalizations prior to admission? No  Has the patient had major surgery during 100 days prior to admission? No  Current Medications   Current Facility-Administered Medications:    0.9 %  sodium chloride infusion, 250 mL, Intravenous, Continuous, Agarwala, Ravi, MD   Place/Maintain arterial line, , , Until Discontinued **AND** 0.9 %  sodium chloride infusion, , Intra-arterial, PRN, Kipp Brood, MD, Stopped at 05/08/20 1258   acetaminophen (TYLENOL) tablet 650 mg, 650 mg, Oral, Q6H PRN, 650 mg at 05/12/20 0953 **OR** acetaminophen (TYLENOL) suppository 650 mg, 650 mg, Rectal, Q6H PRN, Fenton Malling L, NP   amLODipine (NORVASC) tablet 10 mg, 10 mg, Oral, Daily, Little Ishikawa, MD, 10 mg at 05/12/20 7017   bisacodyl (DULCOLAX) suppository 10 mg, 10 mg, Rectal, Daily PRN, Fenton Malling L, NP   carvedilol (COREG) tablet 25 mg, 25 mg, Oral, BID WC, Little Ishikawa, MD, 25 mg at 05/12/20 0748   chlorhexidine (PERIDEX) 0.12 % solution 15 mL, 15 mL, Mouth Rinse, BID, Agarwala, Ravi, MD, 15 mL at 05/12/20 0953   Chlorhexidine Gluconate Cloth 2 % PADS 6 each, 6 each, Topical, Q0600, Kipp Brood, MD, 6 each at 05/12/20 1329   docusate sodium (COLACE) capsule 100 mg, 100 mg, Oral, BID, Little Ishikawa, MD, 100 mg at 05/12/20 7939   feeding supplement (ENSURE ENLIVE / ENSURE  PLUS) liquid 237 mL, 237 mL, Oral, BID BM, Little Ishikawa, MD, 237 mL at 05/12/20 1318   heparin injection 5,000 Units, 5,000 Units, Subcutaneous, Q8H, Kara Mead V, MD, 5,000 Units at 05/12/20 1318   hydrALAZINE (APRESOLINE) injection 10 mg, 10 mg, Intravenous, Q4H PRN, Eliezer Bottom T, MD, 10 mg at 05/11/20 2015   labetalol (NORMODYNE) injection 20 mg, 20 mg, Intravenous, Q2H PRN, Icard, Bradley L, DO, 20 mg at 05/11/20 0853   levETIRAcetam (KEPPRA) tablet 1,000 mg, 1,000 mg, Oral, BID, Adhikari, Amrit, MD, 1,000 mg at 05/12/20 0300   MEDLINE mouth rinse, 15 mL, Mouth Rinse, q12n4p, Agarwala, Ravi, MD, 15 mL at 05/12/20 1330   melatonin tablet 5 mg, 5 mg, Oral, QHS PRN, Icard, Bradley L, DO, 5 mg at 05/11/20 0110   multivitamin with minerals tablet 1 tablet, 1 tablet, Oral, Daily, Adhikari, Amrit, MD, 1 tablet at 05/12/20 9233   oxyCODONE (Oxy IR/ROXICODONE) immediate release tablet 5 mg, 5 mg, Oral, Q4H PRN, Marvis Moeller, NP, 5 mg  at 05/12/20 1318   polyethylene glycol (MIRALAX / GLYCOLAX) packet 17 g, 17 g, Oral, QID, Little Ishikawa, MD   sodium chloride (OCEAN) 0.65 % nasal spray 1 spray, 1 spray, Each Nare, PRN, Ogan, Okoronkwo U, MD   sodium chloride flush (NS) 0.9 % injection 3 mL, 3 mL, Intravenous, Once, Davonna Belling, MD   sodium chloride flush (NS) 0.9 % injection 3 mL, 3 mL, Intravenous, Q12H, Fenton Malling L, NP, 3 mL at 05/12/20 0841   thiamine tablet 100 mg, 100 mg, Oral, Daily, Little Ishikawa, MD, 100 mg at 05/12/20 3664  Patients Current Diet:  Diet Order            Diet general           Diet regular Room service appropriate? 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  Prior Functional Level Prior Function Level of Independence: Independent Comments: per chart review  Self Care: Did the patient need help bathing, dressing, using the toilet or eating?  Independent  Indoor Mobility: Did the patient need assistance with walking from room to room (with or without device)? Independent  Stairs: Did the patient need assistance with internal or external stairs (with or without device)? Independent  Functional Cognition: Did the patient need help planning regular tasks such as shopping or remembering to take medications? Independent  Home Assistive Devices / Equipment    Prior Device Use: Indicate devices/aids used by the patient prior to current illness, exacerbation or injury? None of the above  Current Functional Level Cognition  Arousal/Alertness: Lethargic Overall Cognitive Status: Impaired/Different from baseline Current Attention Level: Sustained Orientation Level: Oriented X4 Following Commands: Follows one step commands with increased time,Follows one step commands consistently Safety/Judgement: Decreased awareness of safety,Decreased awareness of deficits General Comments: pt oriented to place and situation, unaware of need for restraints, unable to recall room number, states he can go home today and see his regular P.T. Attention: Focused,Sustained Focused Attention: Impaired Focused Attention Impairment: Verbal basic Sustained Attention: Impaired Sustained Attention Impairment: Verbal basic Awareness: Impaired Awareness Impairment: Intellectual impairment Problem Solving: Impaired Problem Solving Impairment: Verbal basic Executive Function: Reasoning Reasoning: Impaired Reasoning Impairment: Verbal basic Behaviors: Physical agitation Safety/Judgment: Impaired [employer] Scales of  Cognitive Functioning: Confused/appropriate    Extremity Assessment (includes Sensation/Coordination)  Upper Extremity Assessment: Generalized weakness,RUE deficits/detail,LUE deficits/detail RUE Deficits / Details: decreased fine motor RUE Coordination: decreased fine motor,decreased gross motor LUE Deficits / Details: decreasesed fine motor LUE Coordination: decreased fine motor,decreased gross motor  Lower Extremity Assessment: Defer to PT evaluation RLE Deficits / Details: moving RLE more briskly to command than LLE LLE Deficits / Details: did not move on command, but did slightly move spontaneously    ADLs  Overall ADL's : Needs assistance/impaired Eating/Feeding: NPO Grooming: Oral care,Bed level,Total assistance Grooming Details (indicate cue type and reason): pt tolerating extensive oral care. pt appropriately allowing thearpist to suction mouth Lower Body Dressing: Maximal assistance,Sitting/lateral leans Lower Body Dressing Details (indicate cue type and reason): able to figure 4 cross bil sides, pt requires sock partially don  on toes and able to pull up with cues for visually attend to task Toilet Transfer: +2 for physical assistance,Moderate assistance,Ambulation,BSC Toilet Transfer Details (indicate cue type and reason): simulated EOB to chair Functional mobility during ADLs: +2 for physical assistance,Moderate assistance General ADL Comments: pt pulling at mittens and give verbal cues to stop and that therapy would him him. pt states okay and stops. pt within 20 seconds begins again due to poor recall. Pt oriented to location and place adn unable to recall information. pt states"no" when educated on "you are at [employer] cone" pt states no "[place]"    Mobility  Overal bed mobility: Needs Assistance Bed Mobility: Supine to Sit,Sit to Supine Rolling: Mod assist Supine to sit: Min assist,HOB elevated General bed mobility comments: cues for sequence with pt using rail to rotate  toward left and elevate trunk, min assist to complete trunk elevation. Pt able to return to supine with min assist to fully clear legs    Transfers  Overall transfer level: Needs assistance Equipment used: 2 person hand held assist Transfers: Sit to/from Stand Sit to Stand: Min assist,+2 safety/equipment General transfer comment: min assist to rise with bil HHA to steady in standing    Ambulation / Gait / Stairs / Wheelchair Mobility  Ambulation/Gait Ambulation/Gait assistance: Min assist,+2 safety/equipment Gait Distance (Feet): 72 Feet Assistive device: 2 person hand held assist Gait Pattern/deviations: Step-through pattern,Decreased stride length,Wide base of support General Gait Details: pt with very wide stance with cues to narrow BOS, direction and safety. Bil UE support for safety with pt limited by fatigue, dizziness and pain Gait velocity interpretation: <1.8 ft/sec, indicate of risk for recurrent falls    Posture / Balance Dynamic Sitting Balance Sitting balance - Comments: able to sit EOb without UE support Balance Overall balance assessment: Needs assistance Sitting-balance support: No upper extremity supported,Feet supported Sitting balance-Leahy Scale: Fair Sitting balance - Comments: able to sit EOb without UE support Postural control: Posterior lean Standing balance support: Bilateral upper extremity supported Standing balance-Leahy Scale: Poor Standing balance comment: bil UE HHA for standing and gait High Level Balance Comments: pt with widen base of support and reaching for environment    Special needs/care consideration Continuous Drip IV : 0.9% sodium chloride infusion, feeding supplement (PIVOT 1.5 CAL) per tube, Skin: abrasion to right, left back; eccymosis to right, left eye; Behavioral consideration: RLAS: IV-VI, restless, agitated, Special service needs: family (wife and son) will switch off as visitor ( not simultaneously-approved by Dr. Posey Pronto) and Designated  visitor: wife and son      Previous [place] (from acute therapy documentation) Living Arrangements: Spouse/significant other  Lives With: Spouse Available Help at Discharge: Family Type of Home: [place]: No Additional Comments: pt confused; no family present  Discharge Living Setting Plans for Discharge Living Setting: House,Lives with (comment) (wife) Type of Home at Discharge: House Discharge Home Layout: Two level,Able to live on main level with bedroom/bathroom Alternate Level Stairs-Rails: None Alternate Level Stairs-Number of Steps: NA Discharge Home Access: Stairs to enter Entrance Stairs-Rails: None Entrance Stairs-Number of Steps: 1 Discharge Bathroom Shower/Tub: Walk-in shower (with grab bars) Discharge Bathroom Toilet: Standard Discharge Bathroom Accessibility: Yes How Accessible: Accessible via walker Does the patient have any problems obtaining your medications?: No  Social/Family/Support Systems Patient Roles: 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 Information: wife: [name] [phone] Anticipated Caregiver: wife + son (nicholas) Anticipated Caregiver's Contact Information: see above Ability/Limitations of Caregiver: Min A Caregiver Availability: 24/7 Discharge Plan Discussed  with Primary Caregiver: Yes Is Caregiver In Agreement with Plan?: Yes Does Caregiver/Family have Issues with Lodging/Transportation while Pt is in Rehab?: No   Goals Patient/Family Goal for Rehab: PT:OT:SLP: Supervision/MIn A Expected length of stay: 18-21 days Cultural Considerations: NA Pt/Family Agrees to Admission and willing to participate: Yes Program Orientation Provided & Reviewed with Pt/Caregiver Including Roles  & Responsibilities: Yes  Barriers to Discharge: Home environment access/layout,Behavior,Nutrition means  Barriers to Discharge Comments: one step to enter home; cortrak currently; new BI   Decrease burden of Care through IP  rehab admission: Other NA   Possible need for SNF placement upon discharge:Not anticipated; family is able to provide the anticipated level of assistance at DC (discussed with wife what Min A functional level looks like). They are aware he will need 24/7 A.    Patient Condition: This patient's medical and functional status has changed since the consult dated: 05/09/20 in which the Rehabilitation Physician determined and documented that the patient's condition is appropriate for intensive rehabilitative care in an inpatient rehabilitation facility. See "History of Present Illness" (above) for medical update. Functional changes are: improvement in transfers from being too lethargic to Min A +2 for transfers and Min A +2 72 feet. Patient's medical and functional status update has been discussed with the Rehabilitation physician and patient remains appropriate for inpatient rehabilitation. Will admit to inpatient rehab today, 05/12/20.  Preadmission Screen Completed By:  Raechel Ache, OT, 05/12/2020 1:47 PM ______________________________________________________________________   Discussed status with Dr. Naaman Plummer on 05/12/20 at 11:19AM and received approval for admission today.  Admission Coordinator:  Raechel Ache, time [id number] Sudie Grumbling 05/12/20.

## 2020-05-11 NOTE — Progress Notes (Signed)
Subjective: Patient reports that he has a headache. No acute events overnight.  Objective: Vital signs in last 24 hours: Temp:  [98.1 F (36.7 C)-99.4 F (37.4 C)] 98.1 F (36.7 C) (01/27 0350) Pulse Rate:  [53-94] 62 (01/27 0400) Resp:  [12-33] 20 (01/27 0400) BP: (126-182)/(62-110) 177/90 (01/27 0500) SpO2:  [88 %-100 %] 95 % (01/27 0400) Weight:  [103.7 kg] 103.7 kg (01/27 0552)  Intake/Output from previous day: 01/26 0701 - 01/27 0700 In: 845 [NG/GT:845] Out: 1050 [Urine:1050] Intake/Output this shift: No intake/output data recorded.  Physical Exam: Patient's neuro examination continues to improve. Patient is A/O X4, but remains amnestic to his accident. He makes good eye contact that is sustained.Periods of agitation and restlessness. He is able to follow simple commands. Speechremainsdysarthricbut has slightly improved from yesterday, and continues to be conversant. Pupils 4 mmround and brisk. MAEW with good strength that is symmetric bilaterally. 5/5 BUE/BLE.   Lab Results: Recent Labs    05/09/20 0947 05/10/20 0712  WBC 9.3 11.0*  HGB 13.8 14.1  HCT 40.1 42.0  PLT 149* 168   BMET Recent Labs    05/09/20 0804 05/10/20 0712  NA 140 137  K 4.0 3.2*  CL 106 103  CO2 24 23  GLUCOSE 150* 136*  BUN 21 17  CREATININE 0.89 0.80  CALCIUM 8.5* 8.9    Studies/Results: ECHOCARDIOGRAM COMPLETE  Result Date: 05/10/2020    ECHOCARDIOGRAM REPORT   Patient Name:   [facility] Date of Exam: 05/10/2020 Medical Rec #:  [id number]                    Height:       69.0 in Accession #:    [id number]                   Weight:       226.2 lb Date of Birth:  1955-10-16                    BSA:          2.177 m Patient Age:    65 years                     BP:           146/82 mmHg Patient Gender: M                            HR:           54 bpm. Exam Location:  Inpatient Procedure: 2D Echo, Cardiac Doppler and Color Doppler Indications:    Atrial fibrillation   History:        Patient has prior history of Echocardiogram examinations. Risk                 Factors:Hypertension and Dyslipidemia. GERD.  Sonographer:    Ross Ludwig RDCS (AE) Referring Phys: [id number] Josephine Igo  Sonographer Comments: Suboptimal subcostal window. IMPRESSIONS  1. Left ventricular ejection fraction, by estimation, is 60 to 65%. The left ventricle has normal function. The left ventricle has no regional wall motion abnormalities. There is moderate left ventricular hypertrophy. Left ventricular diastolic parameters are consistent with Grade II diastolic dysfunction (pseudonormalization). Elevated left ventricular end-diastolic pressure.  2. Right ventricular systolic function is normal. The right ventricular size is normal.  3. The mitral valve is normal in structure. No evidence of mitral valve regurgitation.  No evidence of mitral stenosis.  4. The aortic valve is tricuspid. Aortic valve regurgitation is not visualized. Mild to moderate aortic valve sclerosis/calcification is present, without any evidence of aortic stenosis.  5. Aortic dilatation noted. There is mild dilatation of the ascending aorta, measuring 38 mm.  6. The inferior vena cava is normal in size with greater than 50% respiratory variability, suggesting right atrial pressure of 3 mmHg. FINDINGS  Left Ventricle: Left ventricular ejection fraction, by estimation, is 60 to 65%. The left ventricle has normal function. The left ventricle has no regional wall motion abnormalities. The left ventricular internal cavity size was normal in size. There is  moderate left ventricular hypertrophy. Left ventricular diastolic parameters are consistent with Grade II diastolic dysfunction (pseudonormalization). Elevated left ventricular end-diastolic pressure. Right Ventricle: The right ventricular size is normal. No increase in right ventricular wall thickness. Right ventricular systolic function is normal. Left Atrium: Left atrial size was normal  in size. Right Atrium: Right atrial size was normal in size. Pericardium: There is no evidence of pericardial effusion. Mitral Valve: The mitral valve is normal in structure. There is mild thickening of the mitral valve leaflet(s). There is mild calcification of the mitral valve leaflet(s). No evidence of mitral valve regurgitation. No evidence of mitral valve stenosis. Tricuspid Valve: The tricuspid valve is normal in structure. Tricuspid valve regurgitation is not demonstrated. No evidence of tricuspid stenosis. Aortic Valve: The aortic valve is tricuspid. Aortic valve regurgitation is not visualized. Mild to moderate aortic valve sclerosis/calcification is present, without any evidence of aortic stenosis. Aortic valve mean gradient measures 6.0 mmHg. Aortic valve peak gradient measures 11.6 mmHg. Aortic valve area, by VTI measures 2.53 cm. Pulmonic Valve: The pulmonic valve was normal in structure. Pulmonic valve regurgitation is not visualized. No evidence of pulmonic stenosis. Aorta: The aortic root is normal in size and structure and aortic dilatation noted. There is mild dilatation of the ascending aorta, measuring 38 mm. Venous: The inferior vena cava is normal in size with greater than 50% respiratory variability, suggesting right atrial pressure of 3 mmHg. IAS/Shunts: The interatrial septum was not well visualized.  LEFT VENTRICLE PLAX 2D LVIDd:         4.20 cm  Diastology LVIDs:         2.80 cm  LV e' medial:    9.36 cm/s LV PW:         1.70 cm  LV E/e' medial:  13.1 LV IVS:        1.60 cm  LV e' lateral:   7.40 cm/s LVOT diam:     2.10 cm  LV E/e' lateral: 16.6 LV SV:         90 LV SV Index:   41 LVOT Area:     3.46 cm  RIGHT VENTRICLE RV Basal diam:  2.90 cm RV S prime:     12.60 cm/s LEFT ATRIUM             Index       RIGHT ATRIUM           Index LA diam:        3.20 cm 1.47 cm/m  RA Area:     23.20 cm LA Vol (A2C):   71.7 ml 32.94 ml/m RA Volume:   59.70 ml  27.42 ml/m LA Vol (A4C):   81.1 ml  37.25 ml/m LA Biplane Vol: 76.9 ml 35.32 ml/m  AORTIC VALVE AV Area (Vmax):    2.38 cm AV Area (  Vmean):   2.37 cm AV Area (VTI):     2.53 cm AV Vmax:           170.00 cm/s AV Vmean:          116.000 cm/s AV VTI:            0.354 m AV Peak Grad:      11.6 mmHg AV Mean Grad:      6.0 mmHg LVOT Vmax:         117.00 cm/s LVOT Vmean:        79.300 cm/s LVOT VTI:          0.259 m LVOT/AV VTI ratio: 0.73  AORTA Ao Asc diam: 3.80 cm MITRAL VALVE MV Area (PHT): 4.80 cm     SHUNTS MV Decel Time: 158 msec     Systemic VTI:  0.26 m MV E velocity: 123.00 cm/s  Systemic Diam: 2.10 cm MV A velocity: 65.50 cm/s MV E/A ratio:  1.88 Charlton Haws MD Electronically signed by Charlton Haws MD Signature Date/Time: 05/10/2020/5:08:01 PM    Final     Assessment/Plan: 64 y.o.males/p fall, 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 Patient is continuing to make slow forward progress. He is more alert and less confused this morning but is having periods of agitation and restlessness. Continue supportive care. Plan for CIR once appropriate. No new neurosurgical recommendations at this time. Will continue to follow.  LOS: 7 days     Council Mechanic, DNP, NP-C 05/11/2020, 7:35 AM

## 2020-05-11 NOTE — Progress Notes (Signed)
  Speech Language Pathology Treatment: Dysphagia;Cognitive-Linquistic  Patient Details Name: Timothy Case MRN: [id number] DOB: 09-Jan-1956 Today's Date: 05/11/2020 Time: 6503-5465 SLP Time Calculation (min) (ACUTE ONLY): 15 min  Assessment / Plan / Recommendation Clinical Impression  Pt seen at bedside for diet tolerance assessment and treatment for goals toward improved attention, problem solving, and awareness. Today's session was limited due to significant headache. Pt also reports he has not slept in the past 5 days - RN informed. Pt accepted minimal po trials, tolerating orange juice via straw without overt s/s aspiration. Positioning was suboptimal, due to pt intolerance of being positioned upright. Pt was oriented to time, place, and situation today. 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 Pt requesting a visit with the MD - RN aware.   Pt continues to have Cortrak in place to maximize adequate nutrition and hydration. RN reports pt tolerates PO meds well, but has continued to have poor PO intake. SLP will continue skilled treatment for cognitive deficits and to insure tolerance of all po textures. Safe swallow precautions posted at [facility].   HPI HPI: Pt is a 65 y.o. male with a past medical history of CVA, HTN, HLD, GERD, and SOB who was brought into the ED for altered mental status after being found on the ground with facial trauma complaining of headache. Pt had a seizure in the ED which lasting ~2 minutes; pt was postictal and required intubation for airway protection. ETT 1/20-1/22 (self extubation). CT head small SAH bil sylvian fissure, SAH rt occipital lobe, rt frontoparietal SDH, bifrontal hemorrhagic contusions, contusion rt cerebellum, rt occipital bone fracture.      SLP Plan  Continue with current plan of care       Recommendations  Diet recommendations: Regular;Thin liquid Liquids provided via:  Cup;Straw Medication Administration: Whole meds with liquid Supervision: Patient able to self feed;Full supervision/cueing for compensatory strategies Compensations: Slow rate;Small sips/bites;Minimize environmental distractions Postural Changes and/or Swallow Maneuvers: Seated upright 90 degrees                Oral Care Recommendations: Oral care QID Follow up Recommendations: Inpatient Rehab SLP Visit Diagnosis: Cognitive communication deficit (R41.841);Dysarthria and anarthria (R47.1);Dysphagia, unspecified (R13.10) Plan: Continue with current plan of care       GO               Celia B. Murvin Natal, [facility], CCC-SLP Speech Language Pathologist Office: [phone] Pager: [phone]  Leigh Aurora 05/11/2020, 9:45 AM

## 2020-05-11 NOTE — Progress Notes (Signed)
Inpatient Rehabilitation-Admissions Coordinator   Met with pt and his wife bedside to review CIR program and answer any questions. Discussed CIR program details, expectations, anticipated LOS, and expected functional outcomes. Brochures provided for wife to review. They currently would like to pursue CIR program. Will continue to follow for medical readiness and possible admission.   Raechel Ache, OTR/L  Rehab Admissions Coordinator  [phone] 05/11/2020 11:46 AM

## 2020-05-11 NOTE — Progress Notes (Addendum)
PROGRESS NOTE    Timothy Case  RCV:[id number] DOB: 09/08/1955 DOA: 05/04/2020 PCP: Patient, No Pcp Per   Brief Narrative:  65 year old man presented with history of several days of severe headache. Was intact this morning initially, but wife eventually found him on the ground with facial trauma complaining of headache. Became increasingly aphasic and confused before becoming more obtunded. Was intubated for airway protection. Initial CT head showed small frontal contusions and moderate subarachnoid blood.   05/04/2020 - CTA no aneurysm. 05/05/2020 - Repeat CT head shows improvement  1/22 self extubated   Assessment & Plan:   Active Problems:   Subdural hematoma (HCC)   Subarachnoid bleed (HCC)   Contusion of face   Subarachnoid hemorrhage following injury, with loss of consciousness (HCC)   Agitation   Hypokalemia   ICH, SAH and SDH, with bi-frontal and cerebellar contusions. Neurosurgery following - nondisplaced right occipital bone fx Post traumatic seizure, controlled on keppra PT OT following, likely candidate for CIR Speech following -NG tube remains in place to provide appropriate fluid and nutrition; will continue to advance diet as tolerated, once able to take appropriate free water and nutrition we will discontinue NG tube  Acute encephalopathy in the setting of above, improving Ongoing agitation/behavioral disturbances/impulsivity, ongoing - 2/2 above. Delirium precautions, bedside restraints as needed to maintain safety (patient found in floor this morning) Avoid CNS depressants/benzodiazepines  Wife at bedside to assist with orientation today  A.fib with RVR. Continue carvedilol, stop diltiazem ECHO: EF 60-65%; grade 2 diastolic dysfunction  Continue to hold anticoagulation given above  Hypertension, essential, improving Continue carvedilol, increase amlodipine today As needed hydralazine and labetalol ongoing  Covid positive prior to  admission, completed quarantine Asymptomatic; no treatment indicated  Hypokalemia. Resolved, follow morning labs  Ambulatory dysfunction, acute secondary to above, ongoing PT/ OT  May do well with CIR   DVT prophylaxis: None given above Code Status:  Full Family Communication: Wife at bedside  Status is: Inpatient  Dispo: The patient is from: Home              Anticipated d/c is to: CIR per PT recs              Anticipated d/c date is: 24 to 48 hours pending clinical course              Patient currently not medically stable for discharge  Consultants:   PCCM, neurosurgery  Procedures:   None  Antimicrobials:  None  Subjective: No acute issues or events overnight, patient found at bedside this morning sitting in the floor, continues to climb out of bed due to impulsivity, lap restraints ongoing.  Objective: Vitals:   05/11/20 0350 05/11/20 0400 05/11/20 0500 05/11/20 0552  BP:  (!) 156/80 (!) 177/90   Pulse:  62    Resp:  20    Temp: 98.1 F (36.7 C)     TempSrc: Oral     SpO2:  95%    Weight:    103.7 kg  Height:        Intake/Output Summary (Last 24 hours) at 05/11/2020 0711 Last data filed at 05/11/2020 0600 Gross per 24 hour  Intake 845 ml  Output 1050 ml  Net -205 ml   Filed Weights   05/09/20 0616 05/10/20 0500 05/11/20 0552  Weight: 102.7 kg 102.6 kg 103.7 kg    Examination:  General:  Pleasantly resting in bed, No acute distress.  Somnolent but easily arousable, alert and oriented  to person and place HEENT: Bilateral periorbital ecchymosis, pupils equal round reactive to light, EOMI, core track to left nare Neck:  Without mass or deformity.  Trachea is midline. Lungs:  Clear to auscultate bilaterally without rhonchi, wheeze, or rales. Heart:  Regular rate and rhythm.  Without murmurs, rubs, or gallops. Abdomen:  Soft, nontender, nondistended.  Without guarding or rebound. Extremities: Without cyanosis, clubbing, edema, or obvious  deformity. Vascular:  Dorsalis pedis and posterior tibial pulses palpable bilaterally. Skin:  Warm and dry, no erythema, no ulcerations.  Data Reviewed: I have personally reviewed following labs and imaging studies  CBC: Recent Labs  Lab 05/04/20 1513 05/04/20 1610 05/05/20 0304 05/05/20 0450 05/06/20 0515 05/07/20 1807 05/08/20 0628 05/08/20 1803 05/09/20 0947 05/10/20 0712  WBC 18.6*  --  10.1  --  7.3  --  9.4  --  9.3 11.0*  NEUTROABS 15.2*  --   --   --  5.4  --  6.9  --  6.9 7.9*  HGB 16.7   < > 14.1   < > 13.7 13.6 13.9 12.9* 13.8 14.1  HCT 51.5   < > 41.8   < > 40.1 40.0 42.3 38.0* 40.1 42.0  MCV 96.4  --  95.7  --  94.8  --  94.8  --  92.6 91.9  PLT 248  --  186  --  156  --  194  --  149* 168   < > = values in this interval not displayed.   Basic Metabolic Panel: Recent Labs  Lab 05/05/20 0304 05/05/20 0450 05/06/20 0515 05/06/20 1630 05/07/20 1807 05/08/20 0628 05/08/20 1618 05/08/20 1803 05/09/20 0804 05/09/20 1832 05/10/20 0712 05/10/20 1702  NA 138   < > 139  --  140 141  --  140 140  --  137  --   K 3.9   < > 3.9  --  3.3* 3.3*  --  3.3* 4.0  --  3.2*  --   CL 107  --  110  --   --  104  --   --  106  --  103  --   CO2 20*  --  20*  --   --  22  --   --  24  --  23  --   GLUCOSE 120*  --  104*  --   --  106*  --   --  150*  --  136*  --   BUN 22  --  15  --   --  11  --   --  21  --  17  --   CREATININE 0.95  --  0.81  --   --  1.12  --   --  0.89  --  0.80  --   CALCIUM 8.2*  --  7.9*  --   --  8.5*  --   --  8.5*  --  8.9  --   MG 1.9   < > 1.8   < >  --  1.5* 2.1  --  2.0 1.8 1.8 2.0  PHOS 2.6   < > 3.3   < >  --  3.7 3.3  --  2.6 2.5 2.9 3.5   < > = values in this interval not displayed.   GFR: Estimated Creatinine Clearance: 107.8 mL/min (by C-G formula based on SCr of 0.8 mg/dL). Liver Function Tests: Recent Labs  Lab 05/04/20 1513  AST 30  ALT 29  ALKPHOS 43  BILITOT 1.3*  PROT 7.4  ALBUMIN 4.6   No results for input(s): LIPASE,  AMYLASE in the last 168 hours. No results for input(s): AMMONIA in the last 168 hours. Coagulation Profile: Recent Labs  Lab 05/04/20 1513  INR 1.1   Cardiac Enzymes: Recent Labs  Lab 05/05/20 1641  CKTOTAL 150   BNP (last 3 results) No results for input(s): PROBNP in the last 8760 hours. HbA1C: No results for input(s): HGBA1C in the last 72 hours. CBG: Recent Labs  Lab 05/10/20 1226 05/10/20 1701 05/10/20 1912 05/10/20 2309 05/11/20 0310  GLUCAP 122* 114* 111* 119* 121*   Lipid Profile: No results for input(s): CHOL, HDL, LDLCALC, TRIG, CHOLHDL, LDLDIRECT in the last 72 hours. Thyroid Function Tests: No results for input(s): TSH, T4TOTAL, FREET4, T3FREE, THYROIDAB in the last 72 hours. Anemia Panel: No results for input(s): VITAMINB12, FOLATE, FERRITIN, TIBC, IRON, RETICCTPCT in the last 72 hours. Sepsis Labs: No results for input(s): PROCALCITON, LATICACIDVEN in the last 168 hours.  Recent Results (from the past 240 hour(s))  SARS Coronavirus 2 by RT PCR (hospital order, performed in [facility] hospital lab) Nasopharyngeal Nasopharyngeal Swab     Status: Abnormal   Collection Time: 05/04/20  5:05 PM   Specimen: Nasopharyngeal Swab  Result Value Ref Range Status   SARS Coronavirus 2 POSITIVE (A) NEGATIVE Final    Comment: RESULT CALLED TO, READ BACK BY AND VERIFIED WITH: Kerby Moors KOHUT RN 1913 05/04/20 A BROWNING (NOTE) SARS-CoV-2 target nucleic acids are DETECTED  SARS-CoV-2 RNA is generally detectable in upper respiratory specimens  during the acute phase of infection.  Positive results are indicative  of the presence of the identified virus, but do not rule out bacterial infection or co-infection with other pathogens not detected by the test.  Clinical correlation with patient history and  other diagnostic information is necessary to determine patient infection status.  The expected result is negative.  Fact Sheet for Patients:    BoilerBrush.com.cyhttps://www.fda.gov/media/136312/download   Fact Sheet for Healthcare Providers:   https://pope.com/https://www.fda.gov/media/136313/download    This test is not yet approved or cleared by the [place] FDA and  has been authorized for detection and/or diagnosis of SARS-CoV-2 by FDA under an Emergency Use Authorization (EUA).  This EUA will remain in effect (meaning this te st can be used) for the duration of  the COVID-19 declaration under Section 564(b)(1) of the Act, 21 U.S.C. section 360-bbb-3(b)(1), unless the authorization is terminated or revoked sooner.  Performed at [facility], [address] [place], [place] [address]   Urine culture     Status: None   Collection Time: 05/04/20  5:09 PM   Specimen: Urine, Random  Result Value Ref Range Status   Specimen Description URINE, RANDOM  Final   Special Requests NONE  Final   Culture   Final    NO GROWTH Performed at [facility], [address] [place], [place] [address]    Report Status 05/06/2020 FINAL  Final  MRSA PCR Screening     Status: None   Collection Time: 05/05/20  4:31 AM   Specimen: Nasal Mucosa; Nasopharyngeal  Result Value Ref Range Status   MRSA by PCR NEGATIVE NEGATIVE Final    Comment:        The GeneXpert MRSA Assay (FDA approved for NASAL specimens only), is one component of a comprehensive MRSA colonization surveillance program. It is not intended to diagnose MRSA infection nor to guide or monitor treatment  for MRSA infections. Performed at [facility], [address], [place], [place] [address]     Radiology Studies: ECHOCARDIOGRAM COMPLETE  Result Date: 05/10/2020    ECHOCARDIOGRAM REPORT   Patient Name:   [facility] Date of Exam: 05/10/2020 Medical Rec #:  [id number]                    Height:       69.0 in Accession #:    [id number]                   Weight:       226.2 lb Date of Birth:  12/02/1955                    BSA:          2.177 m Patient Age:    64 years                      BP:           146/82 mmHg Patient Gender: M                            HR:           54 bpm. Exam Location:  Inpatient Procedure: 2D Echo, Cardiac Doppler and Color Doppler Indications:    Atrial fibrillation  History:        Patient has prior history of Echocardiogram examinations. Risk                 Factors:Hypertension and Dyslipidemia. GERD.  Sonographer:    Ross Ludwig RDCS (AE) Referring Phys: [id number] Josephine Igo  Sonographer Comments: Suboptimal subcostal window. IMPRESSIONS  1. Left ventricular ejection fraction, by estimation, is 60 to 65%. The left ventricle has normal function. The left ventricle has no regional wall motion abnormalities. There is moderate left ventricular hypertrophy. Left ventricular diastolic parameters are consistent with Grade II diastolic dysfunction (pseudonormalization). Elevated left ventricular end-diastolic pressure.  2. Right ventricular systolic function is normal. The right ventricular size is normal.  3. The mitral valve is normal in structure. No evidence of mitral valve regurgitation. No evidence of mitral stenosis.  4. The aortic valve is tricuspid. Aortic valve regurgitation is not visualized. Mild to moderate aortic valve sclerosis/calcification is present, without any evidence of aortic stenosis.  5. Aortic dilatation noted. There is mild dilatation of the ascending aorta, measuring 38 mm.  6. The inferior vena cava is normal in size with greater than 50% respiratory variability, suggesting right atrial pressure of 3 mmHg. FINDINGS  Left Ventricle: Left ventricular ejection fraction, by estimation, is 60 to 65%. The left ventricle has normal function. The left ventricle has no regional wall motion abnormalities. The left ventricular internal cavity size was normal in size. There is  moderate left ventricular hypertrophy. Left ventricular diastolic parameters are consistent with Grade II diastolic dysfunction (pseudonormalization). Elevated  left ventricular end-diastolic pressure. Right Ventricle: The right ventricular size is normal. No increase in right ventricular wall thickness. Right ventricular systolic function is normal. Left Atrium: Left atrial size was normal in size. Right Atrium: Right atrial size was normal in size. Pericardium: There is no evidence of pericardial effusion. Mitral Valve: The mitral valve is normal in structure. There is mild thickening of the mitral valve leaflet(s). There is mild calcification of the mitral valve leaflet(s). No evidence of  mitral valve regurgitation. No evidence of mitral valve stenosis. Tricuspid Valve: The tricuspid valve is normal in structure. Tricuspid valve regurgitation is not demonstrated. No evidence of tricuspid stenosis. Aortic Valve: The aortic valve is tricuspid. Aortic valve regurgitation is not visualized. Mild to moderate aortic valve sclerosis/calcification is present, without any evidence of aortic stenosis. Aortic valve mean gradient measures 6.0 mmHg. Aortic valve peak gradient measures 11.6 mmHg. Aortic valve area, by VTI measures 2.53 cm. Pulmonic Valve: The pulmonic valve was normal in structure. Pulmonic valve regurgitation is not visualized. No evidence of pulmonic stenosis. Aorta: The aortic root is normal in size and structure and aortic dilatation noted. There is mild dilatation of the ascending aorta, measuring 38 mm. Venous: The inferior vena cava is normal in size with greater than 50% respiratory variability, suggesting right atrial pressure of 3 mmHg. IAS/Shunts: The interatrial septum was not well visualized.  LEFT VENTRICLE PLAX 2D LVIDd:         4.20 cm  Diastology LVIDs:         2.80 cm  LV e' medial:    9.36 cm/s LV PW:         1.70 cm  LV E/e' medial:  13.1 LV IVS:        1.60 cm  LV e' lateral:   7.40 cm/s LVOT diam:     2.10 cm  LV E/e' lateral: 16.6 LV SV:         90 LV SV Index:   41 LVOT Area:     3.46 cm  RIGHT VENTRICLE RV Basal diam:  2.90 cm RV S prime:      12.60 cm/s LEFT ATRIUM             Index       RIGHT ATRIUM           Index LA diam:        3.20 cm 1.47 cm/m  RA Area:     23.20 cm LA Vol (A2C):   71.7 ml 32.94 ml/m RA Volume:   59.70 ml  27.42 ml/m LA Vol (A4C):   81.1 ml 37.25 ml/m LA Biplane Vol: 76.9 ml 35.32 ml/m  AORTIC VALVE AV Area (Vmax):    2.38 cm AV Area (Vmean):   2.37 cm AV Area (VTI):     2.53 cm AV Vmax:           170.00 cm/s AV Vmean:          116.000 cm/s AV VTI:            0.354 m AV Peak Grad:      11.6 mmHg AV Mean Grad:      6.0 mmHg LVOT Vmax:         117.00 cm/s LVOT Vmean:        79.300 cm/s LVOT VTI:          0.259 m LVOT/AV VTI ratio: 0.73  AORTA Ao Asc diam: 3.80 cm MITRAL VALVE MV Area (PHT): 4.80 cm     SHUNTS MV Decel Time: 158 msec     Systemic VTI:  0.26 m MV E velocity: 123.00 cm/s  Systemic Diam: 2.10 cm MV A velocity: 65.50 cm/s MV E/A ratio:  1.88 Charlton Haws MD Electronically signed by Charlton Haws MD Signature Date/Time: 05/10/2020/5:08:01 PM    Final    Scheduled Meds: . amLODipine  5 mg Oral Daily  . carvedilol  25 mg Per Tube BID WC  . chlorhexidine  15 mL Mouth Rinse BID  . Chlorhexidine Gluconate Cloth  6 each Topical Q0600  . docusate  100 mg Per Tube BID  . heparin injection (subcutaneous)  5,000 Units Subcutaneous Q8H  . insulin aspart  2-6 Units Subcutaneous Q4H  . mouth rinse  15 mL Mouth Rinse q12n4p  . multivitamin  15 mL Per Tube Daily  . polyethylene glycol  17 g Per Tube QID  . sodium chloride flush  3 mL Intravenous Once  . sodium chloride flush  3 mL Intravenous Q12H  . thiamine  100 mg Per Tube Daily   Continuous Infusions: . sodium chloride    . sodium chloride Stopped (05/08/20 1258)  . feeding supplement (PIVOT 1.5 CAL) 1,000 mL (05/10/20 2044)  . levETIRAcetam 1,000 mg (05/10/20 2011)    LOS: 7 days   Time spent:  Azucena Fallen, DO [facility]  If 7PM-7AM, please contact night-coverage [email or web address]  05/11/2020, 7:11 AM

## 2020-05-11 NOTE — Progress Notes (Signed)
Patient is still complaining of headache after receiving coreg, hydralazine 10mg , and 20mg  of labetalol. He is not eligible to receive tylenol and neurosurgery has been called. Patient is asking to speak to wife so she was called and is on her way from home. Will await orders from neurosurgery. 05/11/2020 9:49 AM , Benna Arno E

## 2020-05-11 NOTE — Progress Notes (Signed)
Nutrition Follow-up  DOCUMENTATION CODES:   Obesity unspecified  INTERVENTION:    Add Ensure Enlive po BID to maximize oral intake, each supplement provides 350 kcal and 20 grams of protein  Continue TF via Cortrak: Pivot 1.5 at 65 ml/h to provide 2340 kcal, 146 gm protein, 1184 ml free water daily  When intake improves, can transition to nocturnal feedings: Pivot 1.5 at 80 ml/h x 12 hours (6pm-6am) to provide 1440 kcal, 90 gm protein, 730 ml free water daily  NUTRITION DIAGNOSIS:   Inadequate oral intake related to acute illness as evidenced by NPO status.  Ongoing   GOAL:   Patient will meet greater than or equal to 90% of their needs  Met with TF  MONITOR:   Vent status,Labs,Weight trends,TF tolerance  REASON FOR ASSESSMENT:   Consult Enteral/tube feeding initiation and management  ASSESSMENT:   65 year old male who presented to the ED on 1/20 with AMS and after an unwitnessed fall. PMH of HLD, HTN, GERD, TIA in 2014. CT scan revealing bilateral SDH, diffuse SAH, bifrontal hemorrhagic contusions, and a small left cerebellar hemorrhagic contusion. Pt had a witnessed seizure in the ED and was intubated for airway protection. Pt also tested positive for COVID-19.  Discussed patient in ICU rounds and with RN today. Diet was advanced to regular after bedside swallow evaluation with SLP 1/26. Patient did not eat anything this morning d/t headache. Plans to leave Cortrak in place with continuous TF until good intake is established.  Labs and medications reviewed.   Diet Order:   Diet Order            Diet regular Room service appropriate? Yes; Fluid consistency: Thin  Diet effective now                 EDUCATION NEEDS:   No education needs have been identified at this time  Skin:  Skin Assessment: Reviewed RN Assessment  Last BM:  no documented BM  Height:   Ht Readings from Last 1 Encounters:  05/04/20 '5\' 9"'  (1.753 m)    Weight:   Wt Readings from  Last 1 Encounters:  05/11/20 103.7 kg    Ideal Body Weight:  72.7 kg  BMI:  Body mass index is 33.76 kg/m.  Estimated Nutritional Needs:   Kcal:  2200-2400  Protein:  130-150 grams  Fluid:  > 2 L/day    Lucas Mallow, RD, LDN, CNSC Please refer to Amion for contact information.

## 2020-05-12 ENCOUNTER — Other Ambulatory Visit: Payer: Self-pay

## 2020-05-12 ENCOUNTER — Encounter (HOSPITAL_COMMUNITY): Payer: Self-pay | Admitting: Physical Medicine & Rehabilitation

## 2020-05-12 ENCOUNTER — Inpatient Hospital Stay (HOSPITAL_COMMUNITY)
Admission: RE | Admit: 2020-05-12 | Discharge: 2020-05-30 | DRG: 945 | Disposition: A | Discharge status: Home / Self Care | Payer: Medicare Other | Source: Intra-hospital | Attending: Physical Medicine & Rehabilitation | Admitting: Physical Medicine & Rehabilitation

## 2020-05-12 DIAGNOSIS — S069X9S Unspecified intracranial injury with loss of consciousness of unspecified duration, sequela: Secondary | ICD-10-CM | POA: Diagnosis not present

## 2020-05-12 DIAGNOSIS — S069X3S Unspecified intracranial injury with loss of consciousness of 1 hour to 5 hours 59 minutes, sequela: Secondary | ICD-10-CM

## 2020-05-12 DIAGNOSIS — K5901 Slow transit constipation: Secondary | ICD-10-CM

## 2020-05-12 DIAGNOSIS — S02119D Unspecified fracture of occiput, subsequent encounter for fracture with routine healing: Secondary | ICD-10-CM | POA: Diagnosis not present

## 2020-05-12 DIAGNOSIS — S066X9D Traumatic subarachnoid hemorrhage with loss of consciousness of unspecified duration, subsequent encounter: Secondary | ICD-10-CM | POA: Diagnosis present

## 2020-05-12 DIAGNOSIS — S069X0S Unspecified intracranial injury without loss of consciousness, sequela: Secondary | ICD-10-CM | POA: Diagnosis not present

## 2020-05-12 DIAGNOSIS — Z8616 Personal history of COVID-19: Secondary | ICD-10-CM | POA: Diagnosis not present

## 2020-05-12 DIAGNOSIS — K219 Gastro-esophageal reflux disease without esophagitis: Secondary | ICD-10-CM | POA: Diagnosis present

## 2020-05-12 DIAGNOSIS — Z8673 Personal history of transient ischemic attack (TIA), and cerebral infarction without residual deficits: Secondary | ICD-10-CM

## 2020-05-12 DIAGNOSIS — I4891 Unspecified atrial fibrillation: Secondary | ICD-10-CM | POA: Diagnosis present

## 2020-05-12 DIAGNOSIS — R4689 Other symptoms and signs involving appearance and behavior: Secondary | ICD-10-CM | POA: Diagnosis not present

## 2020-05-12 DIAGNOSIS — Z79899 Other long term (current) drug therapy: Secondary | ICD-10-CM | POA: Diagnosis not present

## 2020-05-12 DIAGNOSIS — E7849 Other hyperlipidemia: Secondary | ICD-10-CM | POA: Diagnosis present

## 2020-05-12 DIAGNOSIS — I480 Paroxysmal atrial fibrillation: Secondary | ICD-10-CM | POA: Diagnosis present

## 2020-05-12 DIAGNOSIS — S069XAA Unspecified intracranial injury with loss of consciousness status unknown, initial encounter: Secondary | ICD-10-CM | POA: Diagnosis present

## 2020-05-12 DIAGNOSIS — R1310 Dysphagia, unspecified: Secondary | ICD-10-CM | POA: Diagnosis present

## 2020-05-12 DIAGNOSIS — I10 Essential (primary) hypertension: Secondary | ICD-10-CM | POA: Diagnosis present

## 2020-05-12 DIAGNOSIS — W19XXXD Unspecified fall, subsequent encounter: Secondary | ICD-10-CM | POA: Diagnosis present

## 2020-05-12 DIAGNOSIS — E871 Hypo-osmolality and hyponatremia: Secondary | ICD-10-CM

## 2020-05-12 DIAGNOSIS — Z7982 Long term (current) use of aspirin: Secondary | ICD-10-CM

## 2020-05-12 DIAGNOSIS — I4819 Other persistent atrial fibrillation: Secondary | ICD-10-CM | POA: Diagnosis not present

## 2020-05-12 DIAGNOSIS — S069X0A Unspecified intracranial injury without loss of consciousness, initial encounter: Secondary | ICD-10-CM

## 2020-05-12 DIAGNOSIS — K5900 Constipation, unspecified: Secondary | ICD-10-CM | POA: Diagnosis present

## 2020-05-12 DIAGNOSIS — S069X9A Unspecified intracranial injury with loss of consciousness of unspecified duration, initial encounter: Secondary | ICD-10-CM | POA: Diagnosis present

## 2020-05-12 DIAGNOSIS — S065X9A Traumatic subdural hemorrhage with loss of consciousness of unspecified duration, initial encounter: Secondary | ICD-10-CM | POA: Diagnosis not present

## 2020-05-12 DIAGNOSIS — S069X2S Unspecified intracranial injury with loss of consciousness of 31 minutes to 59 minutes, sequela: Secondary | ICD-10-CM | POA: Diagnosis not present

## 2020-05-12 DIAGNOSIS — S069X2D Unspecified intracranial injury with loss of consciousness of 31 minutes to 59 minutes, subsequent encounter: Secondary | ICD-10-CM | POA: Diagnosis not present

## 2020-05-12 LAB — CBC WITH DIFFERENTIAL/PLATELET
Abs Immature Granulocytes: 0.04 10*3/uL (ref 0.00–0.07)
Basophils Absolute: 0 10*3/uL (ref 0.0–0.1)
Basophils Relative: 0 %
Eosinophils Absolute: 0 10*3/uL (ref 0.0–0.5)
Eosinophils Relative: 0 %
HCT: 42.5 % (ref 39.0–52.0)
Hemoglobin: 14 g/dL (ref 13.0–17.0)
Immature Granulocytes: 0 %
Lymphocytes Relative: 15 %
Lymphs Abs: 1.8 10*3/uL (ref 0.7–4.0)
MCH: 30.9 pg (ref 26.0–34.0)
MCHC: 32.9 g/dL (ref 30.0–36.0)
MCV: 93.8 fL (ref 80.0–100.0)
Monocytes Absolute: 1.7 10*3/uL — ABNORMAL HIGH (ref 0.1–1.0)
Monocytes Relative: 15 %
Neutro Abs: 8 10*3/uL — ABNORMAL HIGH (ref 1.7–7.7)
Neutrophils Relative %: 70 %
Platelets: 224 10*3/uL (ref 150–400)
RBC: 4.53 MIL/uL (ref 4.22–5.81)
RDW: 13.5 % (ref 11.5–15.5)
WBC: 11.5 10*3/uL — ABNORMAL HIGH (ref 4.0–10.5)
nRBC: 0 % (ref 0.0–0.2)

## 2020-05-12 LAB — CBC
HCT: 42.5 % (ref 39.0–52.0)
Hemoglobin: 13.9 g/dL (ref 13.0–17.0)
MCH: 30.6 pg (ref 26.0–34.0)
MCHC: 32.7 g/dL (ref 30.0–36.0)
MCV: 93.6 fL (ref 80.0–100.0)
Platelets: 242 10*3/uL (ref 150–400)
RBC: 4.54 MIL/uL (ref 4.22–5.81)
RDW: 13.4 % (ref 11.5–15.5)
WBC: 11 10*3/uL — ABNORMAL HIGH (ref 4.0–10.5)
nRBC: 0 % (ref 0.0–0.2)

## 2020-05-12 LAB — PHOSPHORUS
Phosphorus: 4.5 mg/dL (ref 2.5–4.6)
Phosphorus: 4.4 mg/dL (ref 2.5–4.6)

## 2020-05-12 LAB — BASIC METABOLIC PANEL
Anion gap: 11 (ref 5–15)
BUN: 27 mg/dL — ABNORMAL HIGH (ref 8–23)
CO2: 25 mmol/L (ref 22–32)
Calcium: 8.6 mg/dL — ABNORMAL LOW (ref 8.9–10.3)
Chloride: 100 mmol/L (ref 98–111)
Creatinine, Ser: 0.91 mg/dL (ref 0.61–1.24)
GFR, Estimated: >60 mL/min (ref >60)
Glucose, Bld: 128 mg/dL — ABNORMAL HIGH (ref 70–99)
Potassium: 3.8 mmol/L (ref 3.5–5.1)
Sodium: 136 mmol/L (ref 135–145)

## 2020-05-12 LAB — GLUCOSE, CAPILLARY
Glucose-Capillary: 147 mg/dL — ABNORMAL HIGH (ref 70–99)
Glucose-Capillary: 125 mg/dL — ABNORMAL HIGH (ref 70–99)
Glucose-Capillary: 143 mg/dL — ABNORMAL HIGH (ref 70–99)

## 2020-05-12 LAB — CREATININE, SERUM
Creatinine, Ser: 0.92 mg/dL (ref 0.61–1.24)
GFR, Estimated: >60 mL/min (ref >60)

## 2020-05-12 LAB — MAGNESIUM
Magnesium: 1.9 mg/dL (ref 1.7–2.4)
Magnesium: 2.2 mg/dL (ref 1.7–2.4)

## 2020-05-12 MED ORDER — THIAMINE HCL 100 MG PO TABS
100.0000 mg | ORAL_TABLET | Freq: Every day | ORAL | Status: DC
  Administered 2020-05-13 – 2020-05-17 (×5): 100 mg via ORAL
  Filled 2020-05-12 (×5): qty 1

## 2020-05-12 MED ORDER — ADULT MULTIVITAMIN W/MINERALS CH: 1.0000 | ORAL_TABLET | Freq: Every day | ORAL | Status: DC

## 2020-05-12 MED ORDER — ENSURE ENLIVE PO LIQD
237.0000 mL | Freq: Two times a day (BID) | ORAL | Status: DC
  Administered 2020-05-13: 237 mL via ORAL

## 2020-05-12 MED ORDER — OXYCODONE HCL 5 MG PO TABS: 5.0000 mg | ORAL_TABLET | ORAL | 0 refills | Status: DC | PRN

## 2020-05-12 MED ORDER — LEVETIRACETAM 500 MG PO TABS
1000.0000 mg | ORAL_TABLET | Freq: Two times a day (BID) | ORAL | Status: DC
  Administered 2020-05-12: 1000 mg via ORAL
  Filled 2020-05-12 (×2): qty 2

## 2020-05-12 MED ORDER — POLYETHYLENE GLYCOL 3350 17 G PO PACK
17.0000 g | PACK | Freq: Four times a day (QID) | ORAL | Status: DC
  Administered 2020-05-12 – 2020-05-30 (×47): 17 g via ORAL
  Filled 2020-05-12 (×52): qty 1

## 2020-05-12 MED ORDER — BISACODYL 10 MG RE SUPP: 10.0000 mg | Freq: Every day | RECTAL | Status: DC | PRN

## 2020-05-12 MED ORDER — CARVEDILOL 25 MG PO TABS: 25.0000 mg | ORAL_TABLET | Freq: Two times a day (BID) | ORAL | Status: DC

## 2020-05-12 MED ORDER — HEPARIN SODIUM (PORCINE) 5000 UNIT/ML IJ SOLN: 5000.0000 [IU] | Freq: Three times a day (TID) | INTRAMUSCULAR | Status: DC

## 2020-05-12 MED ORDER — ADULT MULTIVITAMIN W/MINERALS CH
1.0000 | ORAL_TABLET | Freq: Every day | ORAL | Status: DC
  Administered 2020-05-12: 1 via ORAL
  Filled 2020-05-12: qty 1

## 2020-05-12 MED ORDER — HEPARIN SODIUM (PORCINE) 5000 UNIT/ML IJ SOLN
5000.0000 [IU] | Freq: Three times a day (TID) | INTRAMUSCULAR | Status: DC
  Administered 2020-05-12 – 2020-05-16 (×11): 5000 [IU] via SUBCUTANEOUS
  Filled 2020-05-12 (×15): qty 1

## 2020-05-12 MED ORDER — ACETAMINOPHEN 650 MG RE SUPP: 650.0000 mg | Freq: Four times a day (QID) | RECTAL | Status: DC | PRN

## 2020-05-12 MED ORDER — MELATONIN 5 MG PO TABS
5.0000 mg | ORAL_TABLET | Freq: Every evening | ORAL | Status: DC | PRN
  Administered 2020-05-12: 5 mg via ORAL
  Filled 2020-05-12: qty 1

## 2020-05-12 MED ORDER — ENSURE ENLIVE PO LIQD: 237.0000 mL | Freq: Two times a day (BID) | ORAL | 12 refills | Status: DC

## 2020-05-12 MED ORDER — OXYCODONE HCL 5 MG PO TABS
5.0000 mg | ORAL_TABLET | ORAL | Status: DC | PRN
  Administered 2020-05-12 – 2020-05-21 (×18): 5 mg via ORAL
  Filled 2020-05-12 (×18): qty 1

## 2020-05-12 MED ORDER — LEVETIRACETAM 500 MG PO TABS
1000.0000 mg | ORAL_TABLET | Freq: Two times a day (BID) | ORAL | Status: DC
  Administered 2020-05-12 – 2020-05-18 (×12): 1000 mg via ORAL
  Filled 2020-05-12 (×12): qty 2

## 2020-05-12 MED ORDER — PIVOT 1.5 CAL PO LIQD: 1000.0000 mL | ORAL | 0 refills | Status: DC

## 2020-05-12 MED ORDER — LEVETIRACETAM 1000 MG PO TABS: 1000.0000 mg | ORAL_TABLET | Freq: Two times a day (BID) | ORAL | Status: DC

## 2020-05-12 MED ORDER — AMLODIPINE BESYLATE 10 MG PO TABS: 10.0000 mg | ORAL_TABLET | Freq: Every day | ORAL | Status: DC

## 2020-05-12 MED ORDER — SALINE SPRAY 0.65 % NA SOLN
1.0000 | NASAL | Status: DC | PRN
  Administered 2020-05-14 – 2020-05-18 (×3): 1 via NASAL
  Filled 2020-05-12 (×3): qty 44

## 2020-05-12 MED ORDER — AMLODIPINE BESYLATE 10 MG PO TABS
10.0000 mg | ORAL_TABLET | Freq: Every day | ORAL | Status: DC
  Administered 2020-05-13 – 2020-05-30 (×18): 10 mg via ORAL
  Filled 2020-05-12 (×18): qty 1

## 2020-05-12 MED ORDER — ADULT MULTIVITAMIN W/MINERALS CH
1.0000 | ORAL_TABLET | Freq: Every day | ORAL | Status: DC
  Administered 2020-05-13 – 2020-05-17 (×5): 1 via ORAL
  Filled 2020-05-12 (×5): qty 1

## 2020-05-12 MED ORDER — CARVEDILOL 25 MG PO TABS
25.0000 mg | ORAL_TABLET | Freq: Two times a day (BID) | ORAL | Status: DC
  Administered 2020-05-12 – 2020-05-16 (×8): 25 mg via ORAL
  Filled 2020-05-12 (×8): qty 1

## 2020-05-12 MED ORDER — THIAMINE HCL 100 MG PO TABS: 100.0000 mg | ORAL_TABLET | Freq: Every day | ORAL | Status: DC

## 2020-05-12 MED ORDER — MELATONIN 5 MG PO TABS: 5.0000 mg | ORAL_TABLET | Freq: Every evening | ORAL | 0 refills | Status: DC | PRN

## 2020-05-12 MED ORDER — POLYETHYLENE GLYCOL 3350 17 G PO PACK: 17.0000 g | PACK | Freq: Two times a day (BID) | ORAL | 0 refills | Status: AC

## 2020-05-12 MED ORDER — ACETAMINOPHEN 325 MG PO TABS
650.0000 mg | ORAL_TABLET | Freq: Four times a day (QID) | ORAL | Status: DC | PRN
  Administered 2020-05-14 – 2020-05-22 (×13): 650 mg via ORAL
  Filled 2020-05-12 (×15): qty 2

## 2020-05-12 MED ORDER — DOCUSATE SODIUM 100 MG PO CAPS
100.0000 mg | ORAL_CAPSULE | Freq: Two times a day (BID) | ORAL | Status: DC
  Administered 2020-05-12 – 2020-05-19 (×14): 100 mg via ORAL
  Filled 2020-05-12 (×14): qty 1

## 2020-05-12 NOTE — H&P (Signed)
Physical Medicine and Rehabilitation Admission H&P        Chief Complaint  Patient presents with  . Fall  : HPI: Timothy Case is a 65 year old right-handed male with history of CVA maintained on aspirin, Covid infection in March and then again in December 2021, hypertension, hyperlipidemia, history of alcohol use.  Presented 05/04/2020 after being found down by his wife with altered mental status and aphasia.  Wife had reported recent headache.  Admission chemistries unremarkable except BUN 28 glucose 131 SARS coronavirus positive, urine culture no growth.  Cranial CT scan showed bilateral subdural hematomas without midline shift.  Diffuse subarachnoid hemorrhage predominantly in the sylvian fissures with sparing of the basilar cisterns.  There were bifrontal hemorrhagic contusions.  Negative for facial fracture negative cervical spine.  CTA of head and neck no vascular malformation.  Follow-up CT scan of the head expected blooming of hemorrhagic contusions in the bilateral inferior frontal lobes and right cerebellum.  Traumatic pattern subarachnoid hemorrhage with either increased or diffusion since prior tracing.  Slight increase in right frontal parietal subdural hematoma which measures up to 7 mm in thickness.  No hydrocephalus.  There were findings of nondisplaced right occipital bone fracture.  EEG negative for seizure maintained on Keppra for seizure prophylaxis.  Neurosurgery follow-up Dr. Venetia Maxon as well as neurology services consulted advised conservative care.  Patient was cleared to initiate subcutaneous heparin for DVT prophylaxis.  Hospital course patient did require intubation for airway protection self extubated 05/06/2020.  Patient with bouts of atrial fibrillation RVR placed on intravenous Cardizem and later discontinued and currently maintained on Coreg.  Echocardiogram was completed showing ejection fraction of 60 to 65% grade 2 diastolic dysfunction.  In regards to  patient's Covid positive testing follow-up rapid antigen test was negative it was felt that Covid positive PCR test indicated dead virus shedding rather than active infection and isolation discontinued.  He did have bouts of agitation initially requiring Precedex as well as Haldol.  His diet was advanced to a regular consistency however he still was receiving nasogastric tube feeds for nutritional support..  Therapy evaluations completed due to patient decreased functional mobility cognitive impairment was admitted for a comprehensive rehab program.   Review of Systems  Constitutional: Negative for chills and fever.  HENT: Negative for hearing loss.   Eyes: Negative for blurred vision and double vision.  Respiratory: Negative for cough and shortness of breath.   Cardiovascular: Negative for chest pain, palpitations and leg swelling.  Gastrointestinal: Positive for constipation. Negative for heartburn, nausea and vomiting.       GERD  Genitourinary: Negative for dysuria, flank pain and hematuria.  Musculoskeletal: Positive for back pain and myalgias.  Skin: Negative for rash.  Neurological: Positive for speech change and headaches.  All other systems reviewed and are negative.       Past Medical History:  Diagnosis Date  . Chronic lower back pain      "q am; cause I'm overweight" (04/21/2012)  . Essential familial hyperlipidemia    . GERD (gastroesophageal reflux disease)    . Hyperlipidemia    . Hypertension    . Shortness of breath      "lying down; sometimes" (04/21/2012)  . Stroke (HCC) 04/21/2012    "they are leaning towards mini stroke today; all S/S still gone now except little chest pain" (04/21/2012)         Past Surgical History:  Procedure Laterality Date  . KNEE ARTHROSCOPY   ~  2008    "both knees; ~ 6 months apart" (04/21/2012)    No family history on file. Social History:  reports that he has never smoked. He has never used smokeless tobacco. He reports current alcohol use of  about 6.0 standard drinks of alcohol per week. He reports that he does not use drugs. Allergies: No Known Allergies       Medications Prior to Admission  Medication Sig Dispense Refill  . acetaminophen (TYLENOL) 500 MG tablet Take 1,000 mg by mouth every 6 (six) hours as needed for mild pain or headache.      Marland Kitchen aspirin EC 81 MG tablet Take 81 mg by mouth daily. Swallow whole.      Marland Kitchen atorvastatin (LIPITOR) 20 MG tablet Take 20 mg by mouth daily.       . fluticasone (FLONASE) 50 MCG/ACT nasal spray Place 2 sprays into the nose daily as needed for allergies.      . Glucosamine-Chondroitin 500-250 MG CAPS Take 2 capsules by mouth daily.      Marland Kitchen levocetirizine (XYZAL) 5 MG tablet Take 5 mg by mouth daily as needed for allergies.      . naproxen sodium (ALEVE) 220 MG tablet Take 220 mg by mouth daily as needed (back pain).      Marland Kitchen omeprazole (PRILOSEC) 40 MG capsule Take 40 mg by mouth daily as needed (acid reflux).      . ramipril (ALTACE) 5 MG capsule Take 10 mg by mouth daily.       . rosuvastatin (CRESTOR) 20 MG tablet Take 20 mg by mouth daily.      Marland Kitchen gabapentin (NEURONTIN) 100 MG capsule Take 2 capsules (200 mg total) by mouth at bedtime. (Patient not taking: No sig reported) 60 capsule 3  . predniSONE (DELTASONE) 50 MG tablet Take 1 tablet (50 mg total) by mouth daily. (Patient not taking: No sig reported) 5 tablet 0  . predniSONE (DELTASONE) 50 MG tablet Take 1 tablet (50 mg total) by mouth daily. (Patient not taking: No sig reported) 5 tablet 0  . traMADol (ULTRAM) 50 MG tablet Take 1 tablet (50 mg total) by mouth every 8 (eight) hours as needed. (Patient not taking: No sig reported) 30 tablet 0      Drug Regimen Review Drug regimen was reviewed and remains appropriate with no significant issues identified   Home: Home Living Family/patient expects to be discharged to:: Other (Comment) (CIR) Living Arrangements: Spouse/significant other Available Help at Discharge: Family Type of Home:  House Additional Comments: pt confused; no family present  Lives With: Spouse   Functional History: Prior Function Level of Independence: Independent Comments: per chart review   Functional Status:  Mobility: Bed Mobility Overal bed mobility: Needs Assistance Bed Mobility: Rolling,Supine to Sit Rolling: Mod assist Supine to sit: Mod assist General bed mobility comments: pt exiting on R side with hand over hand to help reach for bed rail and max cues to sit up with R UE Transfers Overall transfer level: Needs assistance Equipment used: 2 person hand held assist Transfers: Sit to/from Stand Sit to Stand: +2 physical assistance,Min assist General transfer comment: pt requires total +2 mod (A) for transfer with widen base of support Ambulation/Gait Ambulation/Gait assistance: Mod assist,+2 physical assistance,+2 safety/equipment Gait Distance (Feet): 9 Feet Assistive device: 2 person hand held assist Gait Pattern/deviations: Step-to pattern,Decreased stride length,Wide base of support General Gait Details: very wide stance and barely moved legs closer together with cues; reporting dizziness and desire to return  to bed, however able to persuade to go to the chair   ADL: ADL Overall ADL's : Needs assistance/impaired Eating/Feeding: NPO Grooming: Oral care,Bed level,Total assistance Grooming Details (indicate cue type and reason): pt tolerating extensive oral care. pt appropriately allowing thearpist to suction mouth Lower Body Dressing: Maximal assistance,Sitting/lateral leans Lower Body Dressing Details (indicate cue type and reason): able to figure 4 cross bil sides, pt requires sock partially don on toes and able to pull up with cues for visually attend to task Toilet Transfer: +2 for physical assistance,Moderate assistance,Ambulation,BSC Toilet Transfer Details (indicate cue type and reason): simulated EOB to chair Functional mobility during ADLs: +2 for physical  assistance,Moderate assistance General ADL Comments: pt pulling at mittens and give verbal cues to stop and that therapy would him him. pt states okay and stops. pt within 20 seconds begins again due to poor recall. Pt oriented to location and place adn unable to recall information. pt states"no" when educated on "you are at [employer] cone" pt states no "m[place]o"   Cognition: Cognition Overall Cognitive Status: Impaired/Different from baseline Arousal/Alertness: Lethargic Orientation Level: Oriented to person,Oriented to time,Disoriented to place,Disoriented to situation Attention: Focused,Sustained Focused Attention: Impaired Focused Attention Impairment: Verbal basic Sustained Attention: Impaired Sustained Attention Impairment: Verbal basic Awareness: Impaired Awareness Impairment: Intellectual impairment Problem Solving: Impaired Problem Solving Impairment: Verbal basic Executive Function: Reasoning Reasoning: Impaired Reasoning Impairment: Verbal basic Behaviors: Physical agitation Safety/Judgment: Impaired Rancho Mirant Scales of Cognitive Functioning: Confused/inappropriate/non-agitated Cognition Arousal/Alertness: Awake/alert Behavior During Therapy: Flat affect Overall Cognitive Status: Impaired/Different from baseline Area of Impairment: Orientation,Attention,Memory,Following commands,Safety/judgement,Awareness,Problem solving,Rancho level Orientation Level: Disoriented to,Time,Situation,Place (reports at hospital in [facility]- has a beach house there.) Current Attention Level: Sustained Memory: Decreased recall of precautions,Decreased short-term memory Following Commands: Follows one step commands with increased time,Follows one step commands consistently Safety/Judgement: Decreased awareness of safety,Decreased awareness of deficits Awareness: Intellectual Problem Solving: Slow processing,Difficulty sequencing,Decreased initiation General Comments: Pt followign simple  commands, pt asking for small piece of wood. son present reports pt frequenctly uses tooth pick and he is currently rubbing his tongue and teeth. so the statement is related to need.   Physical Exam: Blood pressure 116/65, pulse 73, temperature 98.8 F (37.1 C), temperature source Oral, resp. rate 13, height 5\' 9"  (1.753 m), weight 103.7 kg, SpO2 98 %. Physical Exam Constitutional:      Appearance: He is obese.  HENT:     Head:     Comments: bllateral racoon eyes    Right Ear: External ear normal.     Left Ear: External ear normal.     Nose:     Comments: NGT Eyes:     Extraocular Movements: Extraocular movements intact.     Pupils: Pupils are equal, round, and reactive to light.     Comments: Unable to see except for light/dark from left eye  Cardiovascular:     Rate and Rhythm: Normal rate and regular rhythm.     Heart sounds: No murmur heard. No gallop.   Pulmonary:     Effort: Pulmonary effort is normal. No respiratory distress.     Breath sounds: No wheezing or rales.  Abdominal:     General: Bowel sounds are normal. There is no distension.     Palpations: Abdomen is soft.     Tenderness: There is no abdominal tenderness.  Musculoskeletal:        General: No swelling or tenderness. Normal range of motion.     Cervical back: Normal range of motion.  Skin:    General: Skin is warm and dry.  Neurological:     Comments:  Dysarthric speech but intelligible. Awakens easily but falls asleep quickly  Provides biographical information. Told me he "slipped on the ice and hit my forehead." perseverates on having someone remove his NGT. Limited insight and awareness. 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 LE motor 3+/5 prox to 4/5 distally bilaterally. LT and pain sense intact.   Psychiatric:     Comments: Pleasant and cooperative        Lab Results Last 48 Hours        Results for orders placed or performed during the hospital  encounter of 05/04/20 (from the past 48 hour(s))  CBC with Differential/Platelet     Status: Abnormal    Collection Time: 05/10/20  7:12 AM  Result Value Ref Range    WBC 11.0 (H) 4.0 - 10.5 K/uL    RBC 4.57 4.22 - 5.81 MIL/uL    Hemoglobin 14.1 13.0 - 17.0 g/dL    HCT 34.7 42.5 - 95.6 %    MCV 91.9 80.0 - 100.0 fL    MCH 30.9 26.0 - 34.0 pg    MCHC 33.6 30.0 - 36.0 g/dL    RDW 38.7 56.4 - 33.2 %    Platelets 168 150 - 400 K/uL    nRBC 0.0 0.0 - 0.2 %    Neutrophils Relative % 71 %    Neutro Abs 7.9 (H) 1.7 - 7.7 K/uL    Lymphocytes Relative 13 %    Lymphs Abs 1.4 0.7 - 4.0 K/uL    Monocytes Relative 15 %    Monocytes Absolute 1.6 (H) 0.1 - 1.0 K/uL    Eosinophils Relative 0 %    Eosinophils Absolute 0.0 0.0 - 0.5 K/uL    Basophils Relative 0 %    Basophils Absolute 0.0 0.0 - 0.1 K/uL    Immature Granulocytes 1 %    Abs Immature Granulocytes 0.05 0.00 - 0.07 K/uL      Comment: Performed at [facility], [address] [place], [place] [address]  Magnesium     Status: None    Collection Time: 05/10/20  7:12 AM  Result Value Ref Range    Magnesium 1.8 1.7 - 2.4 mg/dL      Comment: Performed at [facility], [address], [place], [place] [address]  Phosphorus     Status: None    Collection Time: 05/10/20  7:12 AM  Result Value Ref Range    Phosphorus 2.9 2.5 - 4.6 mg/dL      Comment: Performed at [facility], [address], [place], [place] [address]  Basic metabolic panel     Status: Abnormal    Collection Time: 05/10/20  7:12 AM  Result Value Ref Range    Sodium 137 135 - 145 mmol/L    Potassium 3.2 (L) 3.5 - 5.1 mmol/L    Chloride 103 98 - 111 mmol/L    CO2 23 22 - 32 mmol/L    Glucose, Bld 136 (H) 70 - 99 mg/dL      Comment: Glucose reference range applies only to samples taken after fasting for at least 8 hours.    BUN 17 8 - 23 mg/dL    Creatinine, Ser 0.10 0.61 - 1.24 mg/dL    Calcium 8.9 8.9 - 93.2 mg/dL    GFR, Estimated >35 >57 mL/min  Comment: (NOTE) Calculated using the CKD-EPI Creatinine Equation (2021)      Anion gap 11 5 - 15      Comment: Performed at [facility], [address], [place], [place] [address]  Glucose, capillary     Status: Abnormal    Collection Time: 05/10/20  7:36 AM  Result Value Ref Range    Glucose-Capillary 152 (H) 70 - 99 mg/dL      Comment: Glucose reference range applies only to samples taken after fasting for at least 8 hours.    Comment 1 Call MD NNP PA CNM    Glucose, capillary     Status: Abnormal    Collection Time: 05/10/20 12:26 PM  Result Value Ref Range    Glucose-Capillary 122 (H) 70 - 99 mg/dL      Comment: Glucose reference range applies only to samples taken after fasting for at least 8 hours.  Glucose, capillary     Status: Abnormal    Collection Time: 05/10/20  5:01 PM  Result Value Ref Range    Glucose-Capillary 114 (H) 70 - 99 mg/dL      Comment: Glucose reference range applies only to samples taken after fasting for at least 8 hours.  Magnesium     Status: None    Collection Time: 05/10/20  5:02 PM  Result Value Ref Range    Magnesium 2.0 1.7 - 2.4 mg/dL      Comment: Performed at [facility], [address] [place], [place] [address]  Phosphorus     Status: None    Collection Time: 05/10/20  5:02 PM  Result Value Ref Range    Phosphorus 3.5 2.5 - 4.6 mg/dL      Comment: Performed at [facility], [address], [place], [place] [address]  Glucose, capillary     Status: Abnormal    Collection Time: 05/10/20  7:12 PM  Result Value Ref Range    Glucose-Capillary 111 (H) 70 - 99 mg/dL      Comment: Glucose reference range applies only to samples taken after fasting for at least 8 hours.  Glucose, capillary     Status: Abnormal    Collection Time: 05/10/20 11:09 PM  Result Value Ref Range    Glucose-Capillary 119 (H) 70 - 99 mg/dL      Comment: Glucose reference range applies only to samples taken after fasting for at least 8 hours.   Glucose, capillary     Status: Abnormal    Collection Time: 05/11/20  3:10 AM  Result Value Ref Range    Glucose-Capillary 121 (H) 70 - 99 mg/dL      Comment: Glucose reference range applies only to samples taken after fasting for at least 8 hours.  CBC with Differential/Platelet     Status: Abnormal    Collection Time: 05/11/20  7:06 AM  Result Value Ref Range    WBC 11.2 (H) 4.0 - 10.5 K/uL    RBC 4.80 4.22 - 5.81 MIL/uL    Hemoglobin 14.7 13.0 - 17.0 g/dL    HCT 91.4 78.2 - 95.6 %    MCV 92.7 80.0 - 100.0 fL    MCH 30.6 26.0 - 34.0 pg    MCHC 33.0 30.0 - 36.0 g/dL    RDW 21.3 08.6 - 57.8 %    Platelets 209 150 - 400 K/uL    nRBC 0.0 0.0 - 0.2 %    Neutrophils Relative % 73 %  Neutro Abs 8.2 (H) 1.7 - 7.7 K/uL    Lymphocytes Relative 12 %    Lymphs Abs 1.3 0.7 - 4.0 K/uL    Monocytes Relative 14 %    Monocytes Absolute 1.6 (H) 0.1 - 1.0 K/uL    Eosinophils Relative 0 %    Eosinophils Absolute 0.0 0.0 - 0.5 K/uL    Basophils Relative 0 %    Basophils Absolute 0.0 0.0 - 0.1 K/uL    Immature Granulocytes 1 %    Abs Immature Granulocytes 0.06 0.00 - 0.07 K/uL      Comment: Performed at [facility], [address], [place], [place] [address]  Magnesium     Status: None    Collection Time: 05/11/20  7:06 AM  Result Value Ref Range    Magnesium 1.9 1.7 - 2.4 mg/dL      Comment: Performed at [facility], [address] [place], [place] [address]  Phosphorus     Status: None    Collection Time: 05/11/20  7:06 AM  Result Value Ref Range    Phosphorus 3.9 2.5 - 4.6 mg/dL      Comment: Performed at [facility], [address], [place], [place] [address]  Basic metabolic panel     Status: Abnormal    Collection Time: 05/11/20  7:06 AM  Result Value Ref Range    Sodium 138 135 - 145 mmol/L    Potassium 3.5 3.5 - 5.1 mmol/L    Chloride 102 98 - 111 mmol/L    CO2 25 22 - 32 mmol/L    Glucose, Bld 121 (H) 70 - 99 mg/dL      Comment: Glucose reference range  applies only to samples taken after fasting for at least 8 hours.    BUN 22 8 - 23 mg/dL    Creatinine, Ser 9.14 0.61 - 1.24 mg/dL    Calcium 9.0 8.9 - 78.2 mg/dL    GFR, Estimated >95 >62 mL/min      Comment: (NOTE) Calculated using the CKD-EPI Creatinine Equation (2021)      Anion gap 11 5 - 15      Comment: Performed at [facility], [address], [place], [place] [address]  Glucose, capillary     Status: Abnormal    Collection Time: 05/11/20  7:17 AM  Result Value Ref Range    Glucose-Capillary 124 (H) 70 - 99 mg/dL      Comment: Glucose reference range applies only to samples taken after fasting for at least 8 hours.  Glucose, capillary     Status: Abnormal    Collection Time: 05/11/20 11:08 AM  Result Value Ref Range    Glucose-Capillary 152 (H) 70 - 99 mg/dL      Comment: Glucose reference range applies only to samples taken after fasting for at least 8 hours.  Glucose, capillary     Status: Abnormal    Collection Time: 05/11/20  3:27 PM  Result Value Ref Range    Glucose-Capillary 143 (H) 70 - 99 mg/dL      Comment: Glucose reference range applies only to samples taken after fasting for at least 8 hours.  Magnesium     Status: None    Collection Time: 05/11/20  4:25 PM  Result Value Ref Range    Magnesium 2.0 1.7 - 2.4 mg/dL      Comment: Performed at [facility], [address] [place], [place] [address]  Phosphorus  Status: None    Collection Time: 05/11/20  4:25 PM  Result Value Ref Range    Phosphorus 4.2 2.5 - 4.6 mg/dL      Comment: Performed at [facility], [address] [place], [place] [address]  Glucose, capillary     Status: Abnormal    Collection Time: 05/11/20  7:10 PM  Result Value Ref Range    Glucose-Capillary 143 (H) 70 - 99 mg/dL      Comment: Glucose reference range applies only to samples taken after fasting for at least 8 hours.  Glucose, capillary     Status: Abnormal    Collection Time: 05/11/20 11:04 PM  Result  Value Ref Range    Glucose-Capillary 135 (H) 70 - 99 mg/dL      Comment: Glucose reference range applies only to samples taken after fasting for at least 8 hours.  Glucose, capillary     Status: Abnormal    Collection Time: 05/12/20  3:06 AM  Result Value Ref Range    Glucose-Capillary 147 (H) 70 - 99 mg/dL      Comment: Glucose reference range applies only to samples taken after fasting for at least 8 hours.       Imaging Results (Last 48 hours)  ECHOCARDIOGRAM COMPLETE   Result Date: 05/10/2020    ECHOCARDIOGRAM REPORT   Patient Name:   SHLOIMA CLINCH [facility] Date of Exam: 05/10/2020 Medical Rec #:  [id number]                    Height:       69.0 in Accession #:    [id number]                   Weight:       226.2 lb Date of Birth:  07/28/1955                    BSA:          2.177 m Patient Age:    64 years                     BP:           146/82 mmHg Patient Gender: M                            HR:           54 bpm. Exam Location:  Inpatient Procedure: 2D Echo, Cardiac Doppler and Color Doppler Indications:    Atrial fibrillation  History:        Patient has prior history of Echocardiogram examinations. Risk                 Factors:Hypertension and Dyslipidemia. GERD.  Sonographer:    Ross Ludwig RDCS (AE) Referring Phys: [id number] Josephine Igo  Sonographer Comments: Suboptimal subcostal window. IMPRESSIONS  1. Left ventricular ejection fraction, by estimation, is 60 to 65%. The left ventricle has normal function. The left ventricle has no regional wall motion abnormalities. There is moderate left ventricular hypertrophy. Left ventricular diastolic parameters are consistent with Grade II diastolic dysfunction (pseudonormalization). Elevated left ventricular end-diastolic pressure.  2. Right ventricular systolic function is normal. The right ventricular size is normal.  3. The mitral valve is normal in structure. No evidence of mitral valve regurgitation. No evidence of mitral stenosis.  4. The  aortic valve is tricuspid. Aortic valve regurgitation  is not visualized. Mild to moderate aortic valve sclerosis/calcification is present, without any evidence of aortic stenosis.  5. Aortic dilatation noted. There is mild dilatation of the ascending aorta, measuring 38 mm.  6. The inferior vena cava is normal in size with greater than 50% respiratory variability, suggesting right atrial pressure of 3 mmHg. FINDINGS  Left Ventricle: Left ventricular ejection fraction, by estimation, is 60 to 65%. The left ventricle has normal function. The left ventricle has no regional wall motion abnormalities. The left ventricular internal cavity size was normal in size. There is  moderate left ventricular hypertrophy. Left ventricular diastolic parameters are consistent with Grade II diastolic dysfunction (pseudonormalization). Elevated left ventricular end-diastolic pressure. Right Ventricle: The right ventricular size is normal. No increase in right ventricular wall thickness. Right ventricular systolic function is normal. Left Atrium: Left atrial size was normal in size. Right Atrium: Right atrial size was normal in size. Pericardium: There is no evidence of pericardial effusion. Mitral Valve: The mitral valve is normal in structure. There is mild thickening of the mitral valve leaflet(s). There is mild calcification of the mitral valve leaflet(s). No evidence of mitral valve regurgitation. No evidence of mitral valve stenosis. Tricuspid Valve: The tricuspid valve is normal in structure. Tricuspid valve regurgitation is not demonstrated. No evidence of tricuspid stenosis. Aortic Valve: The aortic valve is tricuspid. Aortic valve regurgitation is not visualized. Mild to moderate aortic valve sclerosis/calcification is present, without any evidence of aortic stenosis. Aortic valve mean gradient measures 6.0 mmHg. Aortic valve peak gradient measures 11.6 mmHg. Aortic valve area, by VTI measures 2.53 cm. Pulmonic Valve: The  pulmonic valve was normal in structure. Pulmonic valve regurgitation is not visualized. No evidence of pulmonic stenosis. Aorta: The aortic root is normal in size and structure and aortic dilatation noted. There is mild dilatation of the ascending aorta, measuring 38 mm. Venous: The inferior vena cava is normal in size with greater than 50% respiratory variability, suggesting right atrial pressure of 3 mmHg. IAS/Shunts: The interatrial septum was not well visualized.  LEFT VENTRICLE PLAX 2D LVIDd:         4.20 cm  Diastology LVIDs:         2.80 cm  LV e' medial:    9.36 cm/s LV PW:         1.70 cm  LV E/e' medial:  13.1 LV IVS:        1.60 cm  LV e' lateral:   7.40 cm/s LVOT diam:     2.10 cm  LV E/e' lateral: 16.6 LV SV:         90 LV SV Index:   41 LVOT Area:     3.46 cm  RIGHT VENTRICLE RV Basal diam:  2.90 cm RV S prime:     12.60 cm/s LEFT ATRIUM             Index       RIGHT ATRIUM           Index LA diam:        3.20 cm 1.47 cm/m  RA Area:     23.20 cm LA Vol (A2C):   71.7 ml 32.94 ml/m RA Volume:   59.70 ml  27.42 ml/m LA Vol (A4C):   81.1 ml 37.25 ml/m LA Biplane Vol: 76.9 ml 35.32 ml/m  AORTIC VALVE AV Area (Vmax):    2.38 cm AV Area (Vmean):   2.37 cm AV Area (VTI):     2.53 cm AV Vmax:  170.00 cm/s AV Vmean:          116.000 cm/s AV VTI:            0.354 m AV Peak Grad:      11.6 mmHg AV Mean Grad:      6.0 mmHg LVOT Vmax:         117.00 cm/s LVOT Vmean:        79.300 cm/s LVOT VTI:          0.259 m LVOT/AV VTI ratio: 0.73  AORTA Ao Asc diam: 3.80 cm MITRAL VALVE MV Area (PHT): 4.80 cm     SHUNTS MV Decel Time: 158 msec     Systemic VTI:  0.26 m MV E velocity: 123.00 cm/s  Systemic Diam: 2.10 cm MV A velocity: 65.50 cm/s MV E/A ratio:  1.88 Charlton Haws MD Electronically signed by Charlton Haws MD Signature Date/Time: 05/10/2020/5:08:01 PM    Final              Medical Problem List and Plan: 1.  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 (discussed with son who agrees) 2.  Antithrombotics: -DVT/anticoagulation: Subcutaneous heparin 05/08/2020             -antiplatelet therapy: N/A 3. Pain Management: Oxycodone as needed 4. Mood/sleep: Melatonin 5 mg nightly as needed.               -Consider low-dose nightly Seroquel             -check sleep chart             -antipsychotic agents: N/A 5. Neuropsych: This patient is not capable of making decisions on his own behalf. 6. Skin/Wound Care: Routine skin checks 7. Fluids/Electrolytes/Nutrition:              -encourage PO intake             -check labs monday 8.  Seizure prophylaxis.  Keppra 1000 mg mg twice daily 9.  Atrial fibrillation with RVR.  Intravenous Cardizem discontinued.  Continue Coreg 25 mg twice daily             -HR&R controlled and regular at present 10.  Hypertension.  Norvasc 10 mg daily, Coreg 25 mg twice daily.  Monitor with increased mobility 11.  Decreased nutritional storage/Dysphagia:             - Diet has been advanced to regular.               -Currently on nightly tube feeds---will dc today as he's eating fairly well and NGT is serving as a big irritant for him.              -team/family will need to push PO 12.  History of alcohol use.  Monitor for any signs of withdrawal 13.  Constipation.  MiraLAX as directed.  Adjust bowel program as needed. 14.  History recent Covid March and then again December 2021.  Rapid antigen test was negative.  It was felt that Covid positive PCR test indicated dead virus shedding rather than active infection and isolation discontinued.     Mcarthur Rossetti Angiulli, PA-C 05/12/2020  I have personally performed  a face to face diagnostic evaluation of this patient and formulated the key components of the plan.  Additionally, I have personally reviewed  laboratory data, imaging studies, as well as relevant notes and concur with the physician assistant's documentation above.  The patient's status has not changed from the original H&P.  Any changes in documentation from the acute care chart have been noted above.  Ranelle Oyster, MD, [place] [facility]

## 2020-05-12 NOTE — Progress Notes (Signed)
Genice Rouge, MD  Physician  Physical Medicine and Rehabilitation  Consult Note     Signed  Date of Service:  05/08/2020  2:56 PM      Related encounter: ED to Hosp-Admission (Discharged) from 05/04/2020 in [facility] 71[facility] MEDICAL ICU       Signed      Expand All Collapse All             Physical Medicine and Rehabilitation Consult Reason for Consult: Altered mental status with aphasia Referring Physician: Dr. Vassie Loll     HPI: Timothy Case is a 65 y.o. right-handed male with history of CVA maintained on aspirin.  Covid infection in March and then again in December 2021,, hypertension, hyperlipidemia.  Presented 05/04/2020 after being found down by his wife with altered mental status and aphasia.  Wife had reported headache.  Admission chemistries unremarkable except BUN 28, glucose 131, SARS corona virus positive, urine culture no growth.  Cranial CT scan showed bilateral subdural hematomas without midline shift.  Diffuse subarachnoid hemorrhage predominantly in the sylvian fissures with sparing of the basilar cisterns.  There were bifrontal hemorrhagic contusions.  Negative for facial fracture negative cervical spine.  CTA of the head no vascular malformation.  Follow-up CT scan of the head expected blooming of hemorrhagic contusions in the bilateral inferior frontal lobes and right cerebellum.  Traumatic pattern subarachnoid hemorrhage with either increase or diffusion since prior.  Slight increase in right frontal parietal subdural hematoma which measures up to 7 mm in thickness.  No hydrocephalus.  There was findings of a nondisplaced right occipital bone fracture.  EEG negative for seizures maintained on Keppra for seizure prophylaxis.  Neurosurgery as well as neurology consulted advised conservative care.  Hospital course patient did require intubation for airway protection self extubated 05/06/2020.  Patient with bouts of atrial fibrillation with RVR  placed on intravenous Cardizem.  In regards to patient's Covid positive testing follow-up rapid antigen test was negative it was felt that Covid positive PCR test indicated dead virus shedding rather than active infection and isolation discontinued.  Bouts of agitation required Precedex as well as Haldol.  He is n.p.o. with alternative means of nutritional support.  Therapy evaluations completed with recommendations of physical medicine rehab consult.   Pt screaming obscenities while wife in room- saying he would divorce her if she didn't get him out of restraints and take him home.      Review of Systems  Unable to perform ROS: Acuity of condition        Past Medical History:  Diagnosis Date  . Chronic lower back pain      "q am; cause I'm overweight" (04/21/2012)  . Essential familial hyperlipidemia    . GERD (gastroesophageal reflux disease)    . Hyperlipidemia    . Hypertension    . Shortness of breath      "lying down; sometimes" (04/21/2012)  . Stroke (HCC) 04/21/2012    "they are leaning towards mini stroke today; all S/S still gone now except little chest pain" (04/21/2012)    Past Surgical History:  Procedure Laterality Date  . KNEE ARTHROSCOPY   ~ 2008    "both knees; ~ 6 months apart" (04/21/2012)    No family history on file. Social History:  reports that he has never smoked. He has never used smokeless tobacco. He reports current alcohol use of about 6.0 standard drinks of alcohol per week. He reports that he does not use drugs. Allergies: No  Known Allergies       Medications Prior to Admission  Medication Sig Dispense Refill  . acetaminophen (TYLENOL) 500 MG tablet Take 1,000 mg by mouth every 6 (six) hours as needed for mild pain or headache.      Marland Kitchen aspirin EC 81 MG tablet Take 81 mg by mouth daily. Swallow whole.      Marland Kitchen atorvastatin (LIPITOR) 20 MG tablet Take 20 mg by mouth daily.       . fluticasone (FLONASE) 50 MCG/ACT nasal spray Place 2 sprays into the nose daily as  needed for allergies.      . Glucosamine-Chondroitin 500-250 MG CAPS Take 2 capsules by mouth daily.      Marland Kitchen levocetirizine (XYZAL) 5 MG tablet Take 5 mg by mouth daily as needed for allergies.      . naproxen sodium (ALEVE) 220 MG tablet Take 220 mg by mouth daily as needed (back pain).      Marland Kitchen omeprazole (PRILOSEC) 40 MG capsule Take 40 mg by mouth daily as needed (acid reflux).      . ramipril (ALTACE) 5 MG capsule Take 10 mg by mouth daily.       . rosuvastatin (CRESTOR) 20 MG tablet Take 20 mg by mouth daily.      Marland Kitchen gabapentin (NEURONTIN) 100 MG capsule Take 2 capsules (200 mg total) by mouth at bedtime. (Patient not taking: No sig reported) 60 capsule 3  . predniSONE (DELTASONE) 50 MG tablet Take 1 tablet (50 mg total) by mouth daily. (Patient not taking: No sig reported) 5 tablet 0  . predniSONE (DELTASONE) 50 MG tablet Take 1 tablet (50 mg total) by mouth daily. (Patient not taking: No sig reported) 5 tablet 0  . traMADol (ULTRAM) 50 MG tablet Take 1 tablet (50 mg total) by mouth every 8 (eight) hours as needed. (Patient not taking: No sig reported) 30 tablet 0      Home: Home Living Family/patient expects to be discharged to:: Unsure Additional Comments: pt confused; no family present  Functional History: Prior Function Level of Independence: Independent Comments: per chart review Functional Status:  Mobility: Bed Mobility Overal bed mobility: Needs Assistance General bed mobility comments: to sitting via chair position of bed Transfers General transfer comment: too lethargic to attempt   ADL: ADL Overall ADL's : Needs assistance/impaired Grooming: Oral care,Bed level,Total assistance Grooming Details (indicate cue type and reason): pt tolerating extensive oral care. pt appropriately allowing thearpist to suction mouth General ADL Comments: total (A) for all adls.   Cognition: Cognition Overall Cognitive Status: Impaired/Different from baseline Orientation Level:  Oriented to person [employer] Scales of Cognitive Functioning: Confused/inappropriate/non-agitated (RN reporting IV behaviors overnight; not observed) Cognition Arousal/Alertness: Lethargic,Suspect due to medications (on precedex due to periods of agitation) Behavior During Therapy: Flat affect Overall Cognitive Status: Impaired/Different from baseline Area of Impairment: Orientation,Attention,Following commands,Safety/judgement,Awareness,Rancho level Orientation Level: Place,Time,Situation Current Attention Level: Focused,Sustained Following Commands: Follows one step commands inconsistently,Follows one step commands with increased time Safety/Judgement: Decreased awareness of safety,Decreased awareness of deficits Awareness:  (pre-intellectual) General Comments: Pt following simple commands with at times repeated cues. Followed commands>75% of the time including open eyes, stick out tongue, rt hand thumbs up, LAQ RLE). Able to state his name and DOB. Not oriented to month, location   Blood pressure 115/89, pulse 87, temperature 98.8 F (37.1 C), temperature source Axillary, resp. rate (!) 42, height 5\' 9"  (1.753 m), weight 102.2 kg, SpO2 99 %. Physical Exam Vitals and nursing note  reviewed. Exam conducted with a chaperone present.  Constitutional:      Appearance: He is obese.     Comments: Pt yelling obscenities at me and wife because we won't take of restraints- immediately trying to remove Cortrak in very physical manner- would have harmed self if didn't have restraints, wife at bedside, tearful, has cortrak in, inappropriate, but no ACUTE distress; c/o back and leg pain  HENT:     Head: Normocephalic.     Comments: Has B/L raccoon eyes, bloodshot eyes, eyes red/swollen - conjugate gaze    Nose: Nose normal. No congestion.     Mouth/Throat:     Mouth: Mucous membranes are dry.     Pharynx: Oropharynx is clear. No oropharyngeal exudate.  Eyes:     Extraocular Movements:  Extraocular movements intact.  Cardiovascular:     Rate and Rhythm: Regular rhythm.     Heart sounds: Normal heart sounds. No murmur heard.     Comments: tahcycardic in 100s occ when upset Pulmonary:     Comments: CTA B/L- no W/R/R- good air movement  Abdominal:     Comments: Soft, NT, (+)BS hypoactive- protuberant vs distended  Musculoskeletal:     Cervical back: Normal range of motion. No rigidity.     Comments: Moving all 4 extremities- cannot check- will not comply with exam- wearing wrist restraints and mittens B/L  Skin:    Comments: Bruised face  Neurological:     Comments: Patient is alert in no acute distress.  Follows simple commands.  He was able to provide his name and date of birth but very poor recall of events as well is limited insight and awareness Pt is Ranchos IV due to severe agitation, name calling, etc (per wife this is VERY abnormal for pt) and wife said it's the most coherent he's been since admission  Psychiatric:     Comments: Agitated/yelling        Lab Results Last 24 Hours       Results for orders placed or performed during the hospital encounter of 05/04/20 (from the past 24 hour(s))  Glucose, capillary     Status: Abnormal    Collection Time: 05/07/20  3:21 PM  Result Value Ref Range    Glucose-Capillary 112 (H) 70 - 99 mg/dL  I-STAT 7, (LYTES, BLD GAS, ICA, H+H)     Status: Abnormal    Collection Time: 05/07/20  6:07 PM  Result Value Ref Range    pH, Arterial 7.318 (L) 7.350 - 7.450    pCO2 arterial 48.0 32.0 - 48.0 mmHg    pO2, Arterial 159 (H) 83.0 - 108.0 mmHg    Bicarbonate 24.5 20.0 - 28.0 mmol/L    TCO2 26 22 - 32 mmol/L    O2 Saturation 99.0 %    Acid-base deficit 2.0 0.0 - 2.0 mmol/L    Sodium 140 135 - 145 mmol/L    Potassium 3.3 (L) 3.5 - 5.1 mmol/L    Calcium, Ion 1.18 1.15 - 1.40 mmol/L    HCT 40.0 39.0 - 52.0 %    Hemoglobin 13.6 13.0 - 17.0 g/dL    Patient temperature 99.7 F      Collection site Radial      Drawn by  Operator      Sample type ARTERIAL    Glucose, capillary     Status: Abnormal    Collection Time: 05/07/20  8:32 PM  Result Value Ref Range    Glucose-Capillary 108 (H) 70 - 99  mg/dL  Glucose, capillary     Status: None    Collection Time: 05/07/20 11:34 PM  Result Value Ref Range    Glucose-Capillary 97 70 - 99 mg/dL  Glucose, capillary     Status: Abnormal    Collection Time: 05/08/20  3:32 AM  Result Value Ref Range    Glucose-Capillary 116 (H) 70 - 99 mg/dL  CBC with Differential/Platelet     Status: Abnormal    Collection Time: 05/08/20  6:28 AM  Result Value Ref Range    WBC 9.4 4.0 - 10.5 K/uL    RBC 4.46 4.22 - 5.81 MIL/uL    Hemoglobin 13.9 13.0 - 17.0 g/dL    HCT 85.9 29.2 - 44.6 %    MCV 94.8 80.0 - 100.0 fL    MCH 31.2 26.0 - 34.0 pg    MCHC 32.9 30.0 - 36.0 g/dL    RDW 28.6 38.1 - 77.1 %    Platelets 194 150 - 400 K/uL    nRBC 0.0 0.0 - 0.2 %    Neutrophils Relative % 75 %    Neutro Abs 6.9 1.7 - 7.7 K/uL    Lymphocytes Relative 12 %    Lymphs Abs 1.1 0.7 - 4.0 K/uL    Monocytes Relative 13 %    Monocytes Absolute 1.2 (H) 0.1 - 1.0 K/uL    Eosinophils Relative 0 %    Eosinophils Absolute 0.0 0.0 - 0.5 K/uL    Basophils Relative 0 %    Basophils Absolute 0.0 0.0 - 0.1 K/uL    Immature Granulocytes 0 %    Abs Immature Granulocytes 0.03 0.00 - 0.07 K/uL  Basic metabolic panel     Status: Abnormal    Collection Time: 05/08/20  6:28 AM  Result Value Ref Range    Sodium 141 135 - 145 mmol/L    Potassium 3.3 (L) 3.5 - 5.1 mmol/L    Chloride 104 98 - 111 mmol/L    CO2 22 22 - 32 mmol/L    Glucose, Bld 106 (H) 70 - 99 mg/dL    BUN 11 8 - 23 mg/dL    Creatinine, Ser 1.65 0.61 - 1.24 mg/dL    Calcium 8.5 (L) 8.9 - 10.3 mg/dL    GFR, Estimated >79 >03 mL/min    Anion gap 15 5 - 15  Magnesium     Status: Abnormal    Collection Time: 05/08/20  6:28 AM  Result Value Ref Range    Magnesium 1.5 (L) 1.7 - 2.4 mg/dL  Phosphorus     Status: None    Collection Time:  05/08/20  6:28 AM  Result Value Ref Range    Phosphorus 3.7 2.5 - 4.6 mg/dL  Glucose, capillary     Status: Abnormal    Collection Time: 05/08/20  8:31 AM  Result Value Ref Range    Glucose-Capillary 130 (H) 70 - 99 mg/dL  Glucose, capillary     Status: Abnormal    Collection Time: 05/08/20 11:50 AM  Result Value Ref Range    Glucose-Capillary 106 (H) 70 - 99 mg/dL       Imaging Results (Last 48 hours)  DG CHEST PORT 1 VIEW   Result Date: 05/07/2020 CLINICAL DATA:  65 year old male with history of respiratory distress. EXAM: PORTABLE CHEST 1 VIEW COMPARISON:  Chest CT 05/04/2020. FINDINGS: Lung volumes are low. Patchy multifocal ill-defined opacities and areas of interstitial prominence throughout the lungs bilaterally, most severe throughout the mid to lower lungs, favored  to reflect multilobar bilateral pneumonia. No pleural effusions. Pulmonary vasculature is largely obscured. Heart size is borderline enlarged. Upper mediastinal contours are within normal limits. IMPRESSION: 1. The appearance of the chest is favored to reflect multilobar bilateral pneumonia, concerning for potential viral pneumonia such as COVID infection. Electronically Signed   By: Trudie Reed M.D.   On: 05/07/2020 18:13         Assessment/Plan: 1. Diagnosis: TBI ranchos IV 2. Does the need for close, 24 hr/day medical supervision in concert with the patient's rehab needs make it unreasonable for this patient to be served in a less intensive setting? Yes 3. Co-Morbidities requiring supervision/potential complications: TBI, SAH, COVID (+)- off precautions, agitation, dysphagia- is NPO 4. Due to bladder management, bowel management, safety, skin/wound care, disease management, medication administration, pain management and patient education, does the patient require 24 hr/day rehab nursing? Yes 5. Does the patient require coordinated care of a physician, rehab nurse, therapy disciplines of PT, OT and SLP to address  physical and functional deficits in the context of the above medical diagnosis(es)? Yes Addressing deficits in the following areas: balance, endurance, locomotion, strength, transferring, bowel/bladder control, bathing, dressing, feeding, grooming, toileting, cognition, speech, swallowing and psychosocial support 6. Can the patient actively participate in an intensive therapy program of at least 3 hrs of therapy per day at least 5 days per week? Yes 7. The potential for patient to make measurable gains while on inpatient rehab is good 8. Anticipated functional outcomes upon discharge from inpatient rehab are supervision and min assist  with PT, supervision and min assist with OT, supervision and min assist with SLP. 9. Estimated rehab length of stay to reach the above functional goals is: 3 weeks or more 10. Anticipated discharge destination: Home 11. Overall Rehab/Functional Prognosis: good   RECOMMENDATIONS: This patient's condition is appropriate for continued rehabilitative care in the following setting: CIR Patient has agreed to participate in recommended program. Potentially Note that insurance prior authorization may be required for reimbursement for recommended care.   Comment:  1. Suggest Vit B complex if possible can help with agitation from Keppra 2. Suggest low dose Seroquel 25 mg BID- should hopefully help immediately so other meds can work. 3. Suggest something for pain- usually Norco, not Tramadol due to seizure issues- pt complaining severely of pain in back and legs 4. LBM was 6 days ago- suggest sorbitol then follow in 3 hours or so with Enema vs suppository 5. Pt is Ranchos IV, not V by the actions and behavior I saw today- needs restraints obviously to protect himself.  6. If possible, suggest Gabapentin 100 mg TID_ can increase every 2 days or so to help as well with agitation.  7. BP was high when I was in room- so low dose Propanolol could also help behavior.  8. Wife is  emphatic that if she and son, Timothy Case could trade off (both are vaccinated), it would calm pt's agitation more- I can see this occurring and if it doesn't, then can be stopped, but if so, could reduce amount of meds required to calm pt down.  9. I spoke to pt's wife and her son via phone about my recommendations.  10. Will submit for eventual admission to CIR- will d/w admissions coordinators.  11. Thank you for this consult.    I spent a total of 85 minutes on total consult- 20 min doing chart, 1 hour with pt/family and 25 minutes with note and speaking with nurse.  >50%  coordination of care.     Mcarthur RossettiDaniel J Angiulli, PA-C 05/08/2020      I have personally performed a face to face diagnostic evaluation of this patient and formulated the key components of the plan.  Additionally, I have personally reviewed laboratory data, imaging studies, as well as relevant notes and concur with the physician assistant's documentation above.              Revision History                        Routing History              Note Details  Waymon AmatoAuthor Lovorn, Megan, MD File Time 05/09/2020  8:51 PM  Author Type Physician Status Signed  Last Editor Genice RougeLovorn, Megan, MD Service Physical Medicine and Rehabilitation  Hospital Acct # [id number] Admit Date 05/12/2020

## 2020-05-12 NOTE — Progress Notes (Signed)
Inpatient Rehabilitation Medication Review by a Pharmacist  A complete drug regimen review was completed for this patient to identify any potential clinically significant medication issues.  Clinically significant medication issues were identified:  Yes   Type of Medication Issue Identified Description of Issue Urgent (address now) Non-Urgent (address on AM team rounds) Plan   Drug Interaction(s) (clinically significant)       Duplicate Therapy       Allergy       No Medication Administration End Date       Incorrect Dose       Additional Drug Therapy Needed       Other  DC summary stated to start rosuvastatin. Was not started on CIR Non-urgent Clarified with discharging MD that it's ok to hold the rosuvastatin given potential increased risk for ICH      Time spent performing this drug regimen review (minutes):  20  Alphia Moh, PharmD, [place], [facility] Clinical Pharmacist  Please check AMION for all [facility] phone numbers After 10:00 PM, call Main Pharmacy [phone]

## 2020-05-12 NOTE — Progress Notes (Signed)
Physical Therapy Treatment Patient Details Name: Timothy Case MRN: [id number] DOB: 1955-07-04 Today's Date: 05/12/2020    History of Present Illness 65 year old man presented 05/04/20 with history of several days of severe headache. Wife found him on the ground with facial trauma complaining of headache. Became increasingly aphasic, confused, and then obtunded. Had seizure in ED. Was intubated for airway protection. CT head small SAH bil sylvian fissure, SAH rt occipital lobe, rt frontoparietal SDH, bifrontal hemorrhagic contusions, contusion rt cerebellum, rt occipital bone fracture;  1/22 self-extubated; agitation requiring sedation; afib with RVR  PMH-low back pain, HTN, CVA    PT Comments    Pt in bed on arrival with nursing feeding pt due to bil wrist restraints. Pt alert and oriented but not aware of deficits, function or need for assist. Pt with rotational nystagmus grossly 10 sec in sitting but difficult to fully assess due to pt lack of command following for assessment. Pt able to progress gait to include hall but limited by fatigue and pain despite encouragement and reassurance. Pt educated for HEP as well as balance, gait and cognitive deficits in need of assist. Pt demonstrating Rancho VI behavior today. Will continue to follow with CIR appropriate.      Follow Up Recommendations  CIR;Supervision/Assistance - 24 hour     Equipment Recommendations  Rolling walker with 5" wheels    Recommendations for Other Services       Precautions / Restrictions Precautions Precautions: Fall;Other (comment) Precaution Comments: periods of agitation, restraints Restrictions Weight Bearing Restrictions: No    Mobility  Bed Mobility Overal bed mobility: Needs Assistance Bed Mobility: Supine to Sit;Sit to Supine     Supine to sit: Min assist;HOB elevated     General bed mobility comments: cues for sequence with pt using rail to rotate toward left and elevate trunk, min  assist to complete trunk elevation. Pt able to return to supine with min assist to fully clear legs  Transfers Overall transfer level: Needs assistance   Transfers: Sit to/from Stand Sit to Stand: Min assist;+2 safety/equipment         General transfer comment: min assist to rise with bil HHA to steady in standing  Ambulation/Gait Ambulation/Gait assistance: Min assist;+2 safety/equipment Gait Distance (Feet): 72 Feet Assistive device: 2 person hand held assist Gait Pattern/deviations: Step-through pattern;Decreased stride length;Wide base of support   Gait velocity interpretation: <1.8 ft/sec, indicate of risk for recurrent falls General Gait Details: pt with very wide stance with cues to narrow BOS, direction and safety. Bil UE support for safety with pt limited by fatigue, dizziness and pain   Stairs             Wheelchair Mobility    Modified Rankin (Stroke Patients Only)       Balance Overall balance assessment: Needs assistance Sitting-balance support: No upper extremity supported;Feet supported Sitting balance-Leahy Scale: Fair Sitting balance - Comments: able to sit EOb without UE support   Standing balance support: Bilateral upper extremity supported Standing balance-Leahy Scale: Poor Standing balance comment: bil UE HHA for standing and gait                            Cognition Arousal/Alertness: Awake/alert Behavior During Therapy: Flat affect Overall Cognitive Status: Impaired/Different from baseline Area of Impairment: Memory;Following commands;Safety/judgement;Problem solving;Attention               Rancho Levels of Cognitive Functioning [facility] Scales  of Cognitive Functioning: Confused/appropriate   Current Attention Level: Sustained Memory: Decreased short-term memory Following Commands: Follows one step commands with increased time;Follows one step commands consistently Safety/Judgement: Decreased awareness of  safety;Decreased awareness of deficits   Problem Solving: Slow processing;Difficulty sequencing;Decreased initiation General Comments: pt oriented to place and situation, unaware of need for restraints, unable to recall room number, states he can go home today and see his regular P.T.      Exercises General Exercises - Lower Extremity Short Arc Quad: AROM;Both;Seated;10 reps Hip Flexion/Marching: AROM;Both;Seated;10 reps    General Comments        Pertinent Vitals/Pain Pain Score: 5  Pain Location: back Pain Descriptors / Indicators: Aching;Guarding Pain Intervention(s): Limited activity within patient's tolerance;Monitored during session;Repositioned    Home Living                      Prior Function            PT Goals (current goals can now be found in the care plan section) Progress towards PT goals: Progressing toward goals    Frequency    Min 3X/week      PT Plan Current plan remains appropriate    Co-evaluation              AM-PAC PT "6 Clicks" Mobility   Outcome Measure  Help needed turning from your back to your side while in a flat bed without using bedrails?: A Little Help needed moving from lying on your back to sitting on the side of a flat bed without using bedrails?: A Little Help needed moving to and from a bed to a chair (including a wheelchair)?: A Lot Help needed standing up from a chair using your arms (e.g., wheelchair or bedside chair)?: A Little Help needed to walk in hospital room?: A Lot Help needed climbing 3-5 steps with a railing? : Total 6 Click Score: 14    End of Session Equipment Utilized During Treatment: Gait belt Activity Tolerance: Patient tolerated treatment well Patient left: in bed;with call bell/phone within reach;with bed alarm set;with restraints reapplied Nurse Communication: Mobility status PT Visit Diagnosis: Other symptoms and signs involving the nervous system (P38.250)     Time: 5397-6734 PT  Time Calculation (min) (ACUTE ONLY): 26 min  Charges:  $Gait Training: 8-22 mins $Therapeutic Activity: 8-22 mins                     Ramzey Petrovic P, PT Acute Rehabilitation Services Pager: [phone] Office: [phone]    Kristopher Delk B Yanelli Zapanta 05/12/2020, 11:02 AM

## 2020-05-12 NOTE — Progress Notes (Signed)
Timothy Staggers, MD  Physician  Physical Medicine and Rehabilitation  PMR Pre-admission     Signed  Date of Service:  05/11/2020 11:42 AM      Related encounter: ED to Hosp-Admission (Discharged) from 05/04/2020 in [facility]       Signed          Show:Clear all '[x]' Manual'[x]' Template'[x]' Copied  Added by: '[x]' Raechel Ache, OT   '[]' Hover for details  PMR Admission Coordinator Pre-Admission Assessment   Patient: Timothy Case is an 65 y.o., male MRN: [id number] DOB: 12-19-1955 Height: '5\' 9"'  (175.3 cm) Weight: 103.7 kg                                                                                                                                                  Insurance Information HMO:     PPO:      PCP:      IPA:      80/20: yes     OTHER: PRIMARY: Medicare A and B      Policy#: [id number]      Subscriber: patient CM Name:       Phone#:      Fax#:  Pre-Cert#:       Employer:  Benefits:  Phone #: online     Name: verified eligibility via [place] on 05/11/20 Eff. Date: Part A effective 11/14/2018 and B effective 04/16/2019    Deduct: $1,556      Out of Pocket Max:       Life Max:   CIR: Covered per Medicare guidelines once yearly deductible has been met      SNF: days 1-20, 100%, days 21-100, 80% Outpatient: 80%     Co-Pay: 20% Home Health: 100%      Co-Pay:  DME: 80%     Co-Pay: 20% Providers: Pt's choice SECONDARY: [employer]      Policy#: [id number]      Phone#: [phone]   Financial Counselor:       Phone#:    The "Data Collection Information Summary" for patients in Inpatient Rehabilitation Facilities with attached "Privacy Act [place] Records" was provided and verbally reviewed with: Family   Emergency Contact Information         Contact Information     Name Relation Home Work Mobile    Timothy Case Spouse [phone]   [phone]    Timothy Case Son [phone]   [phone]        Current Medical History  Patient Admitting Diagnosis: TBI    History of Present Illness: Timothy Case is a 65 year old right-handed male with history of CVA maintained on aspirin, Covid infection in March and then again in December 2021, hypertension, hyperlipidemia, history of alcohol use.  Presented 05/04/2020 after being found down by his wife with altered mental status and aphasia.  Wife had reported  recent headache.  Admission chemistries unremarkable except BUN 28 glucose 131 SARS coronavirus positive, urine culture no growth.  Cranial CT scan showed bilateral subdural hematomas without midline shift.  Diffuse subarachnoid hemorrhage predominantly in the sylvian fissures with sparing of the basilar cisterns.  There were bifrontal hemorrhagic contusions.  Negative for facial fracture negative cervical spine.  CTA of head and neck no vascular malformation.  Follow-up CT scan of the head expected blooming of hemorrhagic contusions in the bilateral inferior frontal lobes and right cerebellum.  Traumatic pattern subarachnoid hemorrhage with either increased or diffusion since prior tracing.  Slight increase in right frontal parietal subdural hematoma which measures up to 7 mm in thickness.  No hydrocephalus.  There were findings of nondisplaced right occipital bone fracture.  EEG negative for seizure maintained on Keppra for seizure prophylaxis.  Neurosurgery follow-up Dr. Vertell Limber as well as neurology services consulted advised conservative care.  Patient was cleared to initiate subcutaneous heparin for DVT prophylaxis.  Hospital course patient did require intubation for airway protection self extubated 05/06/2020.  Patient with bouts of atrial fibrillation RVR placed on intravenous Cardizem and later discontinued and currently maintained on Coreg.  Echocardiogram was completed showing ejection fraction of 60 to 76% grade 2 diastolic dysfunction.  In regards to patient's Covid positive testing  follow-up rapid antigen test was negative it was felt that Covid positive PCR test indicated dead virus shedding rather than active infection and isolation discontinued.  He did have bouts of agitation initially requiring Precedex as well as Haldol.  His diet was advanced to a regular consistency however he still was receiving nasogastric tube feeds for nutritional support..  Therapy evaluations completed due to patient decreased functional mobility cognitive impairment is to be admitted for a comprehensive rehab program on 05/12/20.   Complete NIHSS TOTAL: 7 Glasgow Coma Scale Score: 15   Past Medical History      Past Medical History:  Diagnosis Date  . Chronic lower back pain      "q am; cause I'm overweight" (04/21/2012)  . Essential familial hyperlipidemia    . GERD (gastroesophageal reflux disease)    . Hyperlipidemia    . Hypertension    . Shortness of breath      "lying down; sometimes" (04/21/2012)  . Stroke ([place]) 04/21/2012    "they are leaning towards mini stroke today; all S/S still gone now except little chest pain" (04/21/2012)      Family History  family history is not on file.   Prior Rehab/Hospitalizations:  Has the patient had prior rehab or hospitalizations prior to admission? No   Has the patient had major surgery during 100 days prior to admission? No   Current Medications    Current Facility-Administered Medications:  .  0.9 %  sodium chloride infusion, 250 mL, Intravenous, Continuous, Agarwala, Ravi, MD .  Place/Maintain arterial line, , , Until Discontinued **AND** 0.9 %  sodium chloride infusion, , Intra-arterial, PRN, Kipp Brood, MD, Stopped at 05/08/20 1258 .  acetaminophen (TYLENOL) tablet 650 mg, 650 mg, Oral, Q6H PRN, 650 mg at 05/12/20 0953 **OR** acetaminophen (TYLENOL) suppository 650 mg, 650 mg, Rectal, Q6H PRN, Fenton Malling L, NP .  amLODipine (NORVASC) tablet 10 mg, 10 mg, Oral, Daily, Little Ishikawa, MD, 10 mg at 05/12/20 0953 .  bisacodyl  (DULCOLAX) suppository 10 mg, 10 mg, Rectal, Daily PRN, Fenton Malling L, NP .  carvedilol (COREG) tablet 25 mg, 25 mg, Oral, BID WC, Little Ishikawa, MD, 25 mg at  05/12/20 0748 .  chlorhexidine (PERIDEX) 0.12 % solution 15 mL, 15 mL, Mouth Rinse, BID, Agarwala, Ravi, MD, 15 mL at 05/12/20 0953 .  Chlorhexidine Gluconate Cloth 2 % PADS 6 each, 6 each, Topical, Q0600, Kipp Brood, MD, 6 each at 05/12/20 1329 .  docusate sodium (COLACE) capsule 100 mg, 100 mg, Oral, BID, Little Ishikawa, MD, 100 mg at 05/12/20 0953 .  feeding supplement (ENSURE ENLIVE / ENSURE PLUS) liquid 237 mL, 237 mL, Oral, BID BM, Little Ishikawa, MD, 237 mL at 05/12/20 1318 .  heparin injection 5,000 Units, 5,000 Units, Subcutaneous, Q8H, Rigoberto Noel, MD, 5,000 Units at 05/12/20 1318 .  hydrALAZINE (APRESOLINE) injection 10 mg, 10 mg, Intravenous, Q4H PRN, Judd Lien, MD, 10 mg at 05/11/20 2015 .  labetalol (NORMODYNE) injection 20 mg, 20 mg, Intravenous, Q2H PRN, Icard, Bradley L, DO, 20 mg at 05/11/20 0853 .  levETIRAcetam (KEPPRA) tablet 1,000 mg, 1,000 mg, Oral, BID, Tawanna Solo, Amrit, MD, 1,000 mg at 05/12/20 0953 .  MEDLINE mouth rinse, 15 mL, Mouth Rinse, q12n4p, Agarwala, Ravi, MD, 15 mL at 05/12/20 1330 .  melatonin tablet 5 mg, 5 mg, Oral, QHS PRN, Icard, Bradley L, DO, 5 mg at 05/11/20 0110 .  multivitamin with minerals tablet 1 tablet, 1 tablet, Oral, Daily, Shelly Coss, MD, 1 tablet at 05/12/20 0953 .  oxyCODONE (Oxy IR/ROXICODONE) immediate release tablet 5 mg, 5 mg, Oral, Q4H PRN, Fenton Malling L, NP, 5 mg at 05/12/20 1318 .  polyethylene glycol (MIRALAX / GLYCOLAX) packet 17 g, 17 g, Oral, QID, Little Ishikawa, MD .  sodium chloride (OCEAN) 0.65 % nasal spray 1 spray, 1 spray, Each Nare, PRN, Ogan, Okoronkwo U, MD .  sodium chloride flush (NS) 0.9 % injection 3 mL, 3 mL, Intravenous, Once, Davonna Belling, MD .  sodium chloride flush (NS) 0.9 % injection 3 mL, 3 mL,  Intravenous, Q12H, Fenton Malling L, NP, 3 mL at 05/12/20 0841 .  thiamine tablet 100 mg, 100 mg, Oral, Daily, Little Ishikawa, MD, 100 mg at 05/12/20 2536   Patients Current Diet:     Diet Order                      Diet general              Diet regular Room service appropriate? 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   Prior Functional Level Prior Function Level of Independence: Independent Comments: per chart review   Self Care: Did the patient need help bathing, dressing, using the toilet or eating?  Independent   Indoor Mobility: Did the patient need assistance with walking from room to room (with or without device)? Independent   Stairs: Did the patient need assistance with internal or external stairs (with or without device)? Independent   Functional Cognition: Did the patient need help planning regular tasks such as shopping or remembering to take medications? Independent   Home Assistive Devices / Equipment   Prior Device Use: Indicate devices/aids used  by the patient prior to current illness, exacerbation or injury? None of the above   Current Functional Level Cognition   Arousal/Alertness: Lethargic Overall Cognitive Status: Impaired/Different from baseline Current Attention Level: Sustained Orientation Level: Oriented X4 Following Commands: Follows one step commands with increased time,Follows one step commands consistently Safety/Judgement: Decreased awareness of safety,Decreased awareness of deficits General Comments: pt oriented to place and  situation, unaware of need for restraints, unable to recall room number, states he can go home today and see his regular P.T. Attention: Focused,Sustained Focused Attention: Impaired Focused Attention Impairment: Verbal basic Sustained Attention: Impaired Sustained Attention Impairment: Verbal basic Awareness: Impaired Awareness Impairment: Intellectual impairment Problem Solving: Impaired Problem Solving Impairment: Verbal basic Executive Function: Reasoning Reasoning: Impaired Reasoning Impairment: Verbal basic Behaviors: Physical agitation Safety/Judgment: Impaired Rancho [employer] Scales of Cognitive Functioning: Confused/appropriate    Extremity Assessment (includes Sensation/Coordination)   Upper Extremity Assessment: Generalized weakness,RUE deficits/detail,LUE deficits/detail RUE Deficits / Details: decreased fine motor RUE Coordination: decreased fine motor,decreased gross motor LUE Deficits / Details: decreasesed fine motor LUE Coordination: decreased fine motor,decreased gross motor  Lower Extremity Assessment: Defer to PT evaluation RLE Deficits / Details: moving RLE more briskly to command than LLE LLE Deficits / Details: did not move on command, but did slightly move spontaneously     ADLs   Overall ADL's : Needs assistance/impaired Eating/Feeding: NPO Grooming: Oral care,Bed level,Total assistance Grooming Details (indicate cue type and reason): pt tolerating extensive oral care. pt appropriately allowing thearpist to suction mouth Lower Body Dressing: Maximal assistance,Sitting/lateral leans Lower Body Dressing Details (indicate cue type and reason): able to figure 4 cross bil sides, pt requires sock partially don on toes and able to pull up with cues for visually attend to task Toilet Transfer: +2 for physical assistance,Moderate assistance,Ambulation,BSC Toilet Transfer Details (indicate cue type and reason): simulated EOB to chair Functional mobility during  ADLs: +2 for physical assistance,Moderate assistance General ADL Comments: pt pulling at mittens and give verbal cues to stop and that therapy would him him. pt states okay and stops. pt within 20 seconds begins again due to poor recall. Pt oriented to location and place adn unable to recall information. pt states"no" when educated on "you are at [employer] cone" pt states no "[place]"     Mobility   Overal bed mobility: Needs Assistance Bed Mobility: Supine to Sit,Sit to Supine Rolling: Mod assist Supine to sit: Min assist,HOB elevated General bed mobility comments: cues for sequence with pt using rail to rotate toward left and elevate trunk, min assist to complete trunk elevation. Pt able to return to supine with min assist to fully clear legs     Transfers   Overall transfer level: Needs assistance Equipment used: 2 person hand held assist Transfers: Sit to/from Stand Sit to Stand: Min assist,+2 safety/equipment General transfer comment: min assist to rise with bil HHA to steady in standing     Ambulation / Gait / Stairs / Wheelchair Mobility   Ambulation/Gait Ambulation/Gait assistance: Min assist,+2 safety/equipment Gait Distance (Feet): 72 Feet Assistive device: 2 person hand held assist Gait Pattern/deviations: Step-through pattern,Decreased stride length,Wide base of support General Gait Details: pt with very wide stance with cues to narrow BOS, direction and safety. Bil UE support for safety with pt limited by fatigue, dizziness and pain Gait velocity interpretation: <1.8 ft/sec, indicate of risk for recurrent falls     Posture / Balance Dynamic Sitting Balance Sitting balance - Comments: able to sit EOb without UE support Balance Overall  balance assessment: Needs assistance Sitting-balance support: No upper extremity supported,Feet supported Sitting balance-Leahy Scale: Fair Sitting balance - Comments: able to sit EOb without UE support Postural control: Posterior lean Standing  balance support: Bilateral upper extremity supported Standing balance-Leahy Scale: Poor Standing balance comment: bil UE HHA for standing and gait High Level Balance Comments: pt with widen base of support and reaching for environment     Special needs/care consideration Continuous Drip IV : 0.9% sodium chloride infusion, feeding supplement (PIVOT 1.5 CAL) per tube, Skin: abrasion to right, left back; eccymosis to right, left eye; Behavioral consideration: RLAS: IV-VI, restless, agitated, Special service needs: family (wife and son) will switch off as visitor ( not simultaneously-approved by Dr. Posey Pronto) and Designated visitor: wife and son         Previous Ruth (from acute therapy documentation) Living Arrangements: Spouse/significant other  Lives With: Spouse Available Help at Discharge: Family Type of Home: [place]: No Additional Comments: pt confused; no family present   Discharge Living Setting Plans for Discharge Living Setting: House,Lives with (comment) (wife) Type of Home at Discharge: House Discharge Home Layout: Two level,Able to live on main level with bedroom/bathroom Alternate Level Stairs-Rails: None Alternate Level Stairs-Number of Steps: NA Discharge Home Access: Stairs to enter Entrance Stairs-Rails: None Entrance Stairs-Number of Steps: 1 Discharge Bathroom Shower/Tub: Walk-in shower (with grab bars) Discharge Bathroom Toilet: Standard Discharge Bathroom Accessibility: Yes How Accessible: Accessible via walker Does the patient have any problems obtaining your medications?: No   Social/Family/Support Systems Patient Roles: Spouse (retired from [employer]) Sport and exercise psychologist Information: wife: [name] [phone] Anticipated Caregiver: wife + son Architectural technologist) Anticipated Caregiver's Contact Information: see above Ability/Limitations of Caregiver: Min A Caregiver Availability: 24/7 Discharge Plan Discussed with Primary Caregiver:  Yes Is Caregiver In Agreement with Plan?: Yes Does Caregiver/Family have Issues with Lodging/Transportation while Pt is in Rehab?: No     Goals Patient/Family Goal for Rehab: PT:OT:SLP: Supervision/MIn A Expected length of stay: 18-21 days Cultural Considerations: NA Pt/Family Agrees to Admission and willing to participate: Yes Program Orientation Provided & Reviewed with Pt/Caregiver Including Roles  & Responsibilities: Yes  Barriers to Discharge: Home environment access/layout,Behavior,Nutrition means  Barriers to Discharge Comments: one step to enter home; cortrak currently; new BI     Decrease burden of Care through IP rehab admission: Other NA     Possible need for SNF placement upon discharge:Not anticipated; family is able to provide the anticipated level of assistance at DC (discussed with wife what Min A functional level looks like). They are aware he will need 24/7 A.      Patient Condition: This patient's medical and functional status has changed since the consult dated: 05/09/20 in which the Rehabilitation Physician determined and documented that the patient's condition is appropriate for intensive rehabilitative care in an inpatient rehabilitation facility. See "History of Present Illness" (above) for medical update. Functional changes are: improvement in transfers from being too lethargic to Min A +2 for transfers and Min A +2 72 feet. Patient's medical and functional status update has been discussed with the Rehabilitation physician and patient remains appropriate for inpatient rehabilitation. Will admit to inpatient rehab today, 05/12/20.   Preadmission Screen Completed By:  Raechel Ache, OT, 05/12/2020 1:47 PM ______________________________________________________________________   Discussed status with Dr. Naaman Plummer on 05/12/20 at 11:19AM and received approval for admission today.   Admission Coordinator:  Raechel Ache, time [id number] Sudie Grumbling 05/12/20.  Revision  History                        Note Details  Author Timothy Staggers, MD File Time 05/12/2020  1:56 PM  Author Type Physician Status Signed  Last Editor Timothy Staggers, MD Service Physical Medicine and [facility] # [id number] Admit Date 05/12/2020

## 2020-05-12 NOTE — Progress Notes (Signed)
Orthopedic Tech Progress Note Patient Details:  Timothy Case [facility] 10/07/1955 [id number] RN called requesting a HAND SPLINT AND PRAFO BOOT. I called back and asked which side because she didn't tell me.. waiting until then Patient ID: Timothy Case, male   DOB: [birth year], 65 y.o.   MRN: [id number]   Donald Pore 05/12/2020, 7:02 PM

## 2020-05-12 NOTE — Progress Notes (Signed)
Patient admitted from 2 [facility]  via wheelchair by staff. Patient and son oriented to floor and made aware of plan of care and safety plan. Reviewed medications list and made patient and son aware that if needed enclosure bed will be available. Bed in lowest position, bed alarm on and intake and call bell with reach. Will continue to monitor.

## 2020-05-12 NOTE — Progress Notes (Signed)
Unable to verify right or left splint need. Order does not specify. Will clarify in am. Grips equal and strong bilat.

## 2020-05-12 NOTE — Progress Notes (Signed)
Subjective: Patient reports that he is continuing to improve. Continues to have complaints of headache. No acute events reported overnight.   Objective: Vital signs in last 24 hours: Temp:  [97.6 F (36.4 C)-98.8 F (37.1 C)] 98.6 F (37 C) (01/28 0722) Pulse Rate:  [60-82] 70 (01/28 0722) Resp:  [10-27] 20 (01/28 0722) BP: (92-180)/(49-149) 139/80 (01/28 0722) SpO2:  [93 %-100 %] 97 % (01/28 0722) Weight:  [103.7 kg] 103.7 kg (01/28 0500)  Intake/Output from previous day: 01/27 0701 - 01/28 0700 In: 1700 [NG/GT:1540] Out: 1675 [Urine:1675] Intake/Output this shift: No intake/output data recorded.  Physical Exam:Patient's neuro examination slightly improved from yesterday.Patientis A/O X4, but remains amnestic to his accident. He makes good eye contact that is sustained.Periods of agitation and restlessness persist. He is able to follow simple commands. Speechmildly dysarthricbut is more conversant and appropriate in conversation.Pupils3 mmround and brisk. MAEW with good strength that is symmetric bilaterally. 5/5 BUE/BLE.  Lab Results: Recent Labs    05/11/20 0706 05/12/20 0601  WBC 11.2* 11.5*  HGB 14.7 14.0  HCT 44.5 42.5  PLT 209 224   BMET Recent Labs    05/11/20 0706 05/12/20 0601  NA 138 136  K 3.5 3.8  CL 102 100  CO2 25 25  GLUCOSE 121* 128*  BUN 22 27*  CREATININE 0.80 0.91  CALCIUM 9.0 8.6*    Studies/Results: ECHOCARDIOGRAM COMPLETE  Result Date: 05/10/2020    ECHOCARDIOGRAM REPORT   Patient Name:   Timothy Case Date of Exam: 05/10/2020 Medical Rec #:  [id number]                    Height:       69.0 in Accession #:    [id number]                   Weight:       226.2 lb Date of Birth:  07-21-1955                    BSA:          2.177 m Patient Age:    65 years                     BP:           146/82 mmHg Patient Gender: M                            HR:           54 bpm. Exam Location:  Inpatient Procedure: 2D Echo, Cardiac  Doppler and Color Doppler Indications:    Atrial fibrillation  History:        Patient has prior history of Echocardiogram examinations. Risk                 Factors:Hypertension and Dyslipidemia. GERD.  Sonographer:    Ross Ludwig RDCS (AE) Referring Phys: [id number] Josephine Igo  Sonographer Comments: Suboptimal subcostal window. IMPRESSIONS  1. Left ventricular ejection fraction, by estimation, is 60 to 65%. The left ventricle has normal function. The left ventricle has no regional wall motion abnormalities. There is moderate left ventricular hypertrophy. Left ventricular diastolic parameters are consistent with Grade II diastolic dysfunction (pseudonormalization). Elevated left ventricular end-diastolic pressure.  2. Right ventricular systolic function is normal. The right ventricular size is normal.  3. The mitral valve is normal in structure. No evidence of  mitral valve regurgitation. No evidence of mitral stenosis.  4. The aortic valve is tricuspid. Aortic valve regurgitation is not visualized. Mild to moderate aortic valve sclerosis/calcification is present, without any evidence of aortic stenosis.  5. Aortic dilatation noted. There is mild dilatation of the ascending aorta, measuring 38 mm.  6. The inferior vena cava is normal in size with greater than 50% respiratory variability, suggesting right atrial pressure of 3 mmHg. FINDINGS  Left Ventricle: Left ventricular ejection fraction, by estimation, is 60 to 65%. The left ventricle has normal function. The left ventricle has no regional wall motion abnormalities. The left ventricular internal cavity size was normal in size. There is  moderate left ventricular hypertrophy. Left ventricular diastolic parameters are consistent with Grade II diastolic dysfunction (pseudonormalization). Elevated left ventricular end-diastolic pressure. Right Ventricle: The right ventricular size is normal. No increase in right ventricular wall thickness. Right ventricular  systolic function is normal. Left Atrium: Left atrial size was normal in size. Right Atrium: Right atrial size was normal in size. Pericardium: There is no evidence of pericardial effusion. Mitral Valve: The mitral valve is normal in structure. There is mild thickening of the mitral valve leaflet(s). There is mild calcification of the mitral valve leaflet(s). No evidence of mitral valve regurgitation. No evidence of mitral valve stenosis. Tricuspid Valve: The tricuspid valve is normal in structure. Tricuspid valve regurgitation is not demonstrated. No evidence of tricuspid stenosis. Aortic Valve: The aortic valve is tricuspid. Aortic valve regurgitation is not visualized. Mild to moderate aortic valve sclerosis/calcification is present, without any evidence of aortic stenosis. Aortic valve mean gradient measures 6.0 mmHg. Aortic valve peak gradient measures 11.6 mmHg. Aortic valve area, by VTI measures 2.53 cm. Pulmonic Valve: The pulmonic valve was normal in structure. Pulmonic valve regurgitation is not visualized. No evidence of pulmonic stenosis. Aorta: The aortic root is normal in size and structure and aortic dilatation noted. There is mild dilatation of the ascending aorta, measuring 38 mm. Venous: The inferior vena cava is normal in size with greater than 50% respiratory variability, suggesting right atrial pressure of 3 mmHg. IAS/Shunts: The interatrial septum was not well visualized.  LEFT VENTRICLE PLAX 2D LVIDd:         4.20 cm  Diastology LVIDs:         2.80 cm  LV e' medial:    9.36 cm/s LV PW:         1.70 cm  LV E/e' medial:  13.1 LV IVS:        1.60 cm  LV e' lateral:   7.40 cm/s LVOT diam:     2.10 cm  LV E/e' lateral: 16.6 LV SV:         90 LV SV Index:   41 LVOT Area:     3.46 cm  RIGHT VENTRICLE RV Basal diam:  2.90 cm RV S prime:     12.60 cm/s LEFT ATRIUM             Index       RIGHT ATRIUM           Index LA diam:        3.20 cm 1.47 cm/m  RA Area:     23.20 cm LA Vol (A2C):   71.7 ml  32.94 ml/m RA Volume:   59.70 ml  27.42 ml/m LA Vol (A4C):   81.1 ml 37.25 ml/m LA Biplane Vol: 76.9 ml 35.32 ml/m  AORTIC VALVE AV Area (Vmax):    2.38  cm AV Area (Vmean):   2.37 cm AV Area (VTI):     2.53 cm AV Vmax:           170.00 cm/s AV Vmean:          116.000 cm/s AV VTI:            0.354 m AV Peak Grad:      11.6 mmHg AV Mean Grad:      6.0 mmHg LVOT Vmax:         117.00 cm/s LVOT Vmean:        79.300 cm/s LVOT VTI:          0.259 m LVOT/AV VTI ratio: 0.73  AORTA Ao Asc diam: 3.80 cm MITRAL VALVE MV Area (PHT): 4.80 cm     SHUNTS MV Decel Time: 158 msec     Systemic VTI:  0.26 m MV E velocity: 123.00 cm/s  Systemic Diam: 2.10 cm MV A velocity: 65.50 cm/s MV E/A ratio:  1.88 Charlton Haws MD Electronically signed by Charlton Haws MD Signature Date/Time: 05/10/2020/5:08:01 PM    Final     Assessment/Plan: 64 y.o.males/p fall, 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 He is more conversant and appropriate this morning. Periods of agitation and restlessness continue. Continue supportive care. Plan for CIR once appropriate. No new neurosurgical recommendations at this time. Will continue to follow.  LOS: 8 days     Council Mechanic, DNP, NP-C 05/12/2020, 7:32 AM

## 2020-05-12 NOTE — H&P (Signed)
Physical Medicine and Rehabilitation Admission H&P    Chief Complaint  Patient presents with  . Fall  : HPI: Timothy Case is a 65 year old right-handed male with history of CVA maintained on aspirin, Covid infection in March and then again in December 2021, hypertension, hyperlipidemia, history of alcohol use.  Presented 05/04/2020 after being found down by his wife with altered mental status and aphasia.  Wife had reported recent headache.  Admission chemistries unremarkable except BUN 28 glucose 131 SARS coronavirus positive, urine culture no growth.  Cranial CT scan showed bilateral subdural hematomas without midline shift.  Diffuse subarachnoid hemorrhage predominantly in the sylvian fissures with sparing of the basilar cisterns.  There were bifrontal hemorrhagic contusions.  Negative for facial fracture negative cervical spine.  CTA of head and neck no vascular malformation.  Follow-up CT scan of the head expected blooming of hemorrhagic contusions in the bilateral inferior frontal lobes and right cerebellum.  Traumatic pattern subarachnoid hemorrhage with either increased or diffusion since prior tracing.  Slight increase in right frontal parietal subdural hematoma which measures up to 7 mm in thickness.  No hydrocephalus.  There were findings of nondisplaced right occipital bone fracture.  EEG negative for seizure maintained on Keppra for seizure prophylaxis.  Neurosurgery follow-up Dr. Venetia Maxon as well as neurology services consulted advised conservative care.  Patient was cleared to initiate subcutaneous heparin for DVT prophylaxis.  Hospital course patient did require intubation for airway protection self extubated 05/06/2020.  Patient with bouts of atrial fibrillation RVR placed on intravenous Cardizem and later discontinued and currently maintained on Coreg.  Echocardiogram was completed showing ejection fraction of 60 to 65% grade 2 diastolic dysfunction.  In regards to patient's Covid  positive testing follow-up rapid antigen test was negative it was felt that Covid positive PCR test indicated dead virus shedding rather than active infection and isolation discontinued.  He did have bouts of agitation initially requiring Precedex as well as Haldol.  His diet was advanced to a regular consistency however he still was receiving nasogastric tube feeds for nutritional support..  Therapy evaluations completed due to patient decreased functional mobility cognitive impairment was admitted for a comprehensive rehab program.  Review of Systems  Constitutional: Negative for chills and fever.  HENT: Negative for hearing loss.   Eyes: Negative for blurred vision and double vision.  Respiratory: Negative for cough and shortness of breath.   Cardiovascular: Negative for chest pain, palpitations and leg swelling.  Gastrointestinal: Positive for constipation. Negative for heartburn, nausea and vomiting.       GERD  Genitourinary: Negative for dysuria, flank pain and hematuria.  Musculoskeletal: Positive for back pain and myalgias.  Skin: Negative for rash.  Neurological: Positive for speech change and headaches.  All other systems reviewed and are negative.  Past Medical History:  Diagnosis Date  . Chronic lower back pain    "q am; cause I'm overweight" (04/21/2012)  . Essential familial hyperlipidemia   . GERD (gastroesophageal reflux disease)   . Hyperlipidemia   . Hypertension   . Shortness of breath    "lying down; sometimes" (04/21/2012)  . Stroke (HCC) 04/21/2012   "they are leaning towards mini stroke today; all S/S still gone now except little chest pain" (04/21/2012)   Past Surgical History:  Procedure Laterality Date  . KNEE ARTHROSCOPY  ~ 2008   "both knees; ~ 6 months apart" (04/21/2012)   No family history on file. Social History:  reports that he has never smoked. He  has never used smokeless tobacco. He reports current alcohol use of about 6.0 standard drinks of alcohol per  week. He reports that he does not use drugs. Allergies: No Known Allergies Medications Prior to Admission  Medication Sig Dispense Refill  . acetaminophen (TYLENOL) 500 MG tablet Take 1,000 mg by mouth every 6 (six) hours as needed for mild pain or headache.    Marland Kitchen. aspirin EC 81 MG tablet Take 81 mg by mouth daily. Swallow whole.    Marland Kitchen. atorvastatin (LIPITOR) 20 MG tablet Take 20 mg by mouth daily.     . fluticasone (FLONASE) 50 MCG/ACT nasal spray Place 2 sprays into the nose daily as needed for allergies.    . Glucosamine-Chondroitin 500-250 MG CAPS Take 2 capsules by mouth daily.    Marland Kitchen. levocetirizine (XYZAL) 5 MG tablet Take 5 mg by mouth daily as needed for allergies.    . naproxen sodium (ALEVE) 220 MG tablet Take 220 mg by mouth daily as needed (back pain).    Marland Kitchen. omeprazole (PRILOSEC) 40 MG capsule Take 40 mg by mouth daily as needed (acid reflux).    . ramipril (ALTACE) 5 MG capsule Take 10 mg by mouth daily.     . rosuvastatin (CRESTOR) 20 MG tablet Take 20 mg by mouth daily.    Marland Kitchen. gabapentin (NEURONTIN) 100 MG capsule Take 2 capsules (200 mg total) by mouth at bedtime. (Patient not taking: No sig reported) 60 capsule 3  . predniSONE (DELTASONE) 50 MG tablet Take 1 tablet (50 mg total) by mouth daily. (Patient not taking: No sig reported) 5 tablet 0  . predniSONE (DELTASONE) 50 MG tablet Take 1 tablet (50 mg total) by mouth daily. (Patient not taking: No sig reported) 5 tablet 0  . traMADol (ULTRAM) 50 MG tablet Take 1 tablet (50 mg total) by mouth every 8 (eight) hours as needed. (Patient not taking: No sig reported) 30 tablet 0    Drug Regimen Review Drug regimen was reviewed and remains appropriate with no significant issues identified  Home: Home Living Family/patient expects to be discharged to:: Other (Comment) (CIR) Living Arrangements: Spouse/significant other Available Help at Discharge: Family Type of Home: House Additional Comments: pt confused; no family present  Lives  With: Spouse   Functional History: Prior Function Level of Independence: Independent Comments: per chart review  Functional Status:  Mobility: Bed Mobility Overal bed mobility: Needs Assistance Bed Mobility: Rolling,Supine to Sit Rolling: Mod assist Supine to sit: Mod assist General bed mobility comments: pt exiting on R side with hand over hand to help reach for bed rail and max cues to sit up with R UE Transfers Overall transfer level: Needs assistance Equipment used: 2 person hand held assist Transfers: Sit to/from Stand Sit to Stand: +2 physical assistance,Min assist General transfer comment: pt requires total +2 mod (A) for transfer with widen base of support Ambulation/Gait Ambulation/Gait assistance: Mod assist,+2 physical assistance,+2 safety/equipment Gait Distance (Feet): 9 Feet Assistive device: 2 person hand held assist Gait Pattern/deviations: Step-to pattern,Decreased stride length,Wide base of support General Gait Details: very wide stance and barely moved legs closer together with cues; reporting dizziness and desire to return to bed, however able to persuade to go to the chair    ADL: ADL Overall ADL's : Needs assistance/impaired Eating/Feeding: NPO Grooming: Oral care,Bed level,Total assistance Grooming Details (indicate cue type and reason): pt tolerating extensive oral care. pt appropriately allowing thearpist to suction mouth Lower Body Dressing: Maximal assistance,Sitting/lateral leans Lower Body Dressing Details (indicate cue  type and reason): able to figure 4 cross bil sides, pt requires sock partially don on toes and able to pull up with cues for visually attend to task Toilet Transfer: +2 for physical assistance,Moderate assistance,Ambulation,BSC Toilet Transfer Details (indicate cue type and reason): simulated EOB to chair Functional mobility during ADLs: +2 for physical assistance,Moderate assistance General ADL Comments: pt pulling at mittens and  give verbal cues to stop and that therapy would him him. pt states okay and stops. pt within 20 seconds begins again due to poor recall. Pt oriented to location and place adn unable to recall information. pt states"no" when educated on "you are at [employer] cone" pt states no "m[place]o"  Cognition: Cognition Overall Cognitive Status: Impaired/Different from baseline Arousal/Alertness: Lethargic Orientation Level: Oriented to person,Oriented to time,Disoriented to place,Disoriented to situation Attention: Focused,Sustained Focused Attention: Impaired Focused Attention Impairment: Verbal basic Sustained Attention: Impaired Sustained Attention Impairment: Verbal basic Awareness: Impaired Awareness Impairment: Intellectual impairment Problem Solving: Impaired Problem Solving Impairment: Verbal basic Executive Function: Reasoning Reasoning: Impaired Reasoning Impairment: Verbal basic Behaviors: Physical agitation Safety/Judgment: Impaired Rancho Mirant Scales of Cognitive Functioning: Confused/inappropriate/non-agitated Cognition Arousal/Alertness: Awake/alert Behavior During Therapy: Flat affect Overall Cognitive Status: Impaired/Different from baseline Area of Impairment: Orientation,Attention,Memory,Following commands,Safety/judgement,Awareness,Problem solving,Rancho level Orientation Level: Disoriented to,Time,Situation,Place (reports at hospital in [facility]- has a beach house there.) Current Attention Level: Sustained Memory: Decreased recall of precautions,Decreased short-term memory Following Commands: Follows one step commands with increased time,Follows one step commands consistently Safety/Judgement: Decreased awareness of safety,Decreased awareness of deficits Awareness: Intellectual Problem Solving: Slow processing,Difficulty sequencing,Decreased initiation General Comments: Pt followign simple commands, pt asking for small piece of wood. son present reports pt frequenctly uses  tooth pick and he is currently rubbing his tongue and teeth. so the statement is related to need.  Physical Exam: Blood pressure 116/65, pulse 73, temperature 98.8 F (37.1 C), temperature source Oral, resp. rate 13, height 5\' 9"  (1.753 m), weight 103.7 kg, SpO2 98 %. Physical Exam Constitutional:      Appearance: He is obese.  HENT:     Head:     Comments: bllateral racoon eyes    Right Ear: External ear normal.     Left Ear: External ear normal.     Nose:     Comments: NGT Eyes:     Extraocular Movements: Extraocular movements intact.     Pupils: Pupils are equal, round, and reactive to light.     Comments: Unable to see except for light/dark from left eye  Cardiovascular:     Rate and Rhythm: Normal rate and regular rhythm.     Heart sounds: No murmur heard. No gallop.   Pulmonary:     Effort: Pulmonary effort is normal. No respiratory distress.     Breath sounds: No wheezing or rales.  Abdominal:     General: Bowel sounds are normal. There is no distension.     Palpations: Abdomen is soft.     Tenderness: There is no abdominal tenderness.  Musculoskeletal:        General: No swelling or tenderness. Normal range of motion.     Cervical back: Normal range of motion.  Skin:    General: Skin is warm and dry.  Neurological:     Comments:  Dysarthric speech but intelligible. Awakens easily but falls asleep quickly  Provides biographical information. Told me he "slipped on the ice and hit my forehead." perseverates on having someone remove his NGT. Limited insight and awareness. 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 LE motor 3+/5 prox to 4/5 distally bilaterally. LT and pain sense intact.   Psychiatric:     Comments: Pleasant and cooperative     Results for orders placed or performed during the hospital encounter of 05/04/20 (from the past 48 hour(s))  CBC with Differential/Platelet     Status: Abnormal   Collection Time:  05/10/20  7:12 AM  Result Value Ref Range   WBC 11.0 (H) 4.0 - 10.5 K/uL   RBC 4.57 4.22 - 5.81 MIL/uL   Hemoglobin 14.1 13.0 - 17.0 g/dL   HCT 13.2 44.0 - 10.2 %   MCV 91.9 80.0 - 100.0 fL   MCH 30.9 26.0 - 34.0 pg   MCHC 33.6 30.0 - 36.0 g/dL   RDW 72.5 36.6 - 44.0 %   Platelets 168 150 - 400 K/uL   nRBC 0.0 0.0 - 0.2 %   Neutrophils Relative % 71 %   Neutro Abs 7.9 (H) 1.7 - 7.7 K/uL   Lymphocytes Relative 13 %   Lymphs Abs 1.4 0.7 - 4.0 K/uL   Monocytes Relative 15 %   Monocytes Absolute 1.6 (H) 0.1 - 1.0 K/uL   Eosinophils Relative 0 %   Eosinophils Absolute 0.0 0.0 - 0.5 K/uL   Basophils Relative 0 %   Basophils Absolute 0.0 0.0 - 0.1 K/uL   Immature Granulocytes 1 %   Abs Immature Granulocytes 0.05 0.00 - 0.07 K/uL    Comment: Performed at [facility], [address] [place], [place] [address]  Magnesium     Status: None   Collection Time: 05/10/20  7:12 AM  Result Value Ref Range   Magnesium 1.8 1.7 - 2.4 mg/dL    Comment: Performed at [facility], [address] [place], [place] [address]  Phosphorus     Status: None   Collection Time: 05/10/20  7:12 AM  Result Value Ref Range   Phosphorus 2.9 2.5 - 4.6 mg/dL    Comment: Performed at [facility], [address], [place], [place] [address]  Basic metabolic panel     Status: Abnormal   Collection Time: 05/10/20  7:12 AM  Result Value Ref Range   Sodium 137 135 - 145 mmol/L   Potassium 3.2 (L) 3.5 - 5.1 mmol/L   Chloride 103 98 - 111 mmol/L   CO2 23 22 - 32 mmol/L   Glucose, Bld 136 (H) 70 - 99 mg/dL    Comment: Glucose reference range applies only to samples taken after fasting for at least 8 hours.   BUN 17 8 - 23 mg/dL   Creatinine, Ser 3.32 0.61 - 1.24 mg/dL   Calcium 8.9 8.9 - 95.1 mg/dL   GFR, Estimated >88 >41 mL/min    Comment: (NOTE) Calculated using the CKD-EPI Creatinine Equation (2021)    Anion gap 11 5 - 15    Comment: Performed at [facility], [address],  [place], [place] [address]  Glucose, capillary     Status: Abnormal   Collection Time: 05/10/20  7:36 AM  Result Value Ref Range   Glucose-Capillary 152 (H) 70 - 99 mg/dL    Comment: Glucose reference range applies only to samples taken after fasting for at least 8 hours.   Comment 1 Call MD NNP PA CNM   Glucose, capillary     Status: Abnormal   Collection Time: 05/10/20 12:26 PM  Result Value Ref Range  Glucose-Capillary 122 (H) 70 - 99 mg/dL    Comment: Glucose reference range applies only to samples taken after fasting for at least 8 hours.  Glucose, capillary     Status: Abnormal   Collection Time: 05/10/20  5:01 PM  Result Value Ref Range   Glucose-Capillary 114 (H) 70 - 99 mg/dL    Comment: Glucose reference range applies only to samples taken after fasting for at least 8 hours.  Magnesium     Status: None   Collection Time: 05/10/20  5:02 PM  Result Value Ref Range   Magnesium 2.0 1.7 - 2.4 mg/dL    Comment: Performed at [facility], [address], [place], [place] [address]  Phosphorus     Status: None   Collection Time: 05/10/20  5:02 PM  Result Value Ref Range   Phosphorus 3.5 2.5 - 4.6 mg/dL    Comment: Performed at [facility], [address] [place], [place] [address]  Glucose, capillary     Status: Abnormal   Collection Time: 05/10/20  7:12 PM  Result Value Ref Range   Glucose-Capillary 111 (H) 70 - 99 mg/dL    Comment: Glucose reference range applies only to samples taken after fasting for at least 8 hours.  Glucose, capillary     Status: Abnormal   Collection Time: 05/10/20 11:09 PM  Result Value Ref Range   Glucose-Capillary 119 (H) 70 - 99 mg/dL    Comment: Glucose reference range applies only to samples taken after fasting for at least 8 hours.  Glucose, capillary     Status: Abnormal   Collection Time: 05/11/20  3:10 AM  Result Value Ref Range   Glucose-Capillary 121 (H) 70 - 99 mg/dL    Comment: Glucose reference range applies only to samples  taken after fasting for at least 8 hours.  CBC with Differential/Platelet     Status: Abnormal   Collection Time: 05/11/20  7:06 AM  Result Value Ref Range   WBC 11.2 (H) 4.0 - 10.5 K/uL   RBC 4.80 4.22 - 5.81 MIL/uL   Hemoglobin 14.7 13.0 - 17.0 g/dL   HCT 09.8 11.9 - 14.7 %   MCV 92.7 80.0 - 100.0 fL   MCH 30.6 26.0 - 34.0 pg   MCHC 33.0 30.0 - 36.0 g/dL   RDW 82.9 56.2 - 13.0 %   Platelets 209 150 - 400 K/uL   nRBC 0.0 0.0 - 0.2 %   Neutrophils Relative % 73 %   Neutro Abs 8.2 (H) 1.7 - 7.7 K/uL   Lymphocytes Relative 12 %   Lymphs Abs 1.3 0.7 - 4.0 K/uL   Monocytes Relative 14 %   Monocytes Absolute 1.6 (H) 0.1 - 1.0 K/uL   Eosinophils Relative 0 %   Eosinophils Absolute 0.0 0.0 - 0.5 K/uL   Basophils Relative 0 %   Basophils Absolute 0.0 0.0 - 0.1 K/uL   Immature Granulocytes 1 %   Abs Immature Granulocytes 0.06 0.00 - 0.07 K/uL    Comment: Performed at [facility], [address] [place], [place] [address]  Magnesium     Status: None   Collection Time: 05/11/20  7:06 AM  Result Value Ref Range   Magnesium 1.9 1.7 - 2.4 mg/dL    Comment: Performed at [facility], [address] [place], [place] [address]  Phosphorus     Status: None   Collection Time: 05/11/20  7:06 AM  Result Value Ref Range  Phosphorus 3.9 2.5 - 4.6 mg/dL    Comment: Performed at [facility], [address] [place], [place] [address]  Basic metabolic panel     Status: Abnormal   Collection Time: 05/11/20  7:06 AM  Result Value Ref Range   Sodium 138 135 - 145 mmol/L   Potassium 3.5 3.5 - 5.1 mmol/L   Chloride 102 98 - 111 mmol/L   CO2 25 22 - 32 mmol/L   Glucose, Bld 121 (H) 70 - 99 mg/dL    Comment: Glucose reference range applies only to samples taken after fasting for at least 8 hours.   BUN 22 8 - 23 mg/dL   Creatinine, Ser 2.87 0.61 - 1.24 mg/dL   Calcium 9.0 8.9 - 86.7 mg/dL   GFR, Estimated >67 >20 mL/min    Comment: (NOTE) Calculated using the CKD-EPI  Creatinine Equation (2021)    Anion gap 11 5 - 15    Comment: Performed at [facility], [address], [place], [place] [address]  Glucose, capillary     Status: Abnormal   Collection Time: 05/11/20  7:17 AM  Result Value Ref Range   Glucose-Capillary 124 (H) 70 - 99 mg/dL    Comment: Glucose reference range applies only to samples taken after fasting for at least 8 hours.  Glucose, capillary     Status: Abnormal   Collection Time: 05/11/20 11:08 AM  Result Value Ref Range   Glucose-Capillary 152 (H) 70 - 99 mg/dL    Comment: Glucose reference range applies only to samples taken after fasting for at least 8 hours.  Glucose, capillary     Status: Abnormal   Collection Time: 05/11/20  3:27 PM  Result Value Ref Range   Glucose-Capillary 143 (H) 70 - 99 mg/dL    Comment: Glucose reference range applies only to samples taken after fasting for at least 8 hours.  Magnesium     Status: None   Collection Time: 05/11/20  4:25 PM  Result Value Ref Range   Magnesium 2.0 1.7 - 2.4 mg/dL    Comment: Performed at [facility], [address] [place], [place] [address]  Phosphorus     Status: None   Collection Time: 05/11/20  4:25 PM  Result Value Ref Range   Phosphorus 4.2 2.5 - 4.6 mg/dL    Comment: Performed at [facility], [address] [place], [place] [address]  Glucose, capillary     Status: Abnormal   Collection Time: 05/11/20  7:10 PM  Result Value Ref Range   Glucose-Capillary 143 (H) 70 - 99 mg/dL    Comment: Glucose reference range applies only to samples taken after fasting for at least 8 hours.  Glucose, capillary     Status: Abnormal   Collection Time: 05/11/20 11:04 PM  Result Value Ref Range   Glucose-Capillary 135 (H) 70 - 99 mg/dL    Comment: Glucose reference range applies only to samples taken after fasting for at least 8 hours.  Glucose, capillary     Status: Abnormal   Collection Time: 05/12/20  3:06 AM  Result Value Ref Range    Glucose-Capillary 147 (H) 70 - 99 mg/dL    Comment: Glucose reference range applies only to samples taken after fasting for at least 8 hours.   ECHOCARDIOGRAM COMPLETE  Result Date: 05/10/2020    ECHOCARDIOGRAM REPORT   Patient Name:   RAYMIE GIAMMARCO [facility] Date of Exam: 05/10/2020 Medical Rec #:  [id number]  Height:       69.0 in Accession #:    [id number]                   Weight:       226.2 lb Date of Birth:  May 10, 1955                    BSA:          2.177 m Patient Age:    64 years                     BP:           146/82 mmHg Patient Gender: M                            HR:           54 bpm. Exam Location:  Inpatient Procedure: 2D Echo, Cardiac Doppler and Color Doppler Indications:    Atrial fibrillation  History:        Patient has prior history of Echocardiogram examinations. Risk                 Factors:Hypertension and Dyslipidemia. GERD.  Sonographer:    Ross Ludwig RDCS (AE) Referring Phys: [id number] Josephine Igo  Sonographer Comments: Suboptimal subcostal window. IMPRESSIONS  1. Left ventricular ejection fraction, by estimation, is 60 to 65%. The left ventricle has normal function. The left ventricle has no regional wall motion abnormalities. There is moderate left ventricular hypertrophy. Left ventricular diastolic parameters are consistent with Grade II diastolic dysfunction (pseudonormalization). Elevated left ventricular end-diastolic pressure.  2. Right ventricular systolic function is normal. The right ventricular size is normal.  3. The mitral valve is normal in structure. No evidence of mitral valve regurgitation. No evidence of mitral stenosis.  4. The aortic valve is tricuspid. Aortic valve regurgitation is not visualized. Mild to moderate aortic valve sclerosis/calcification is present, without any evidence of aortic stenosis.  5. Aortic dilatation noted. There is mild dilatation of the ascending aorta, measuring 38 mm.  6. The inferior vena cava is normal in  size with greater than 50% respiratory variability, suggesting right atrial pressure of 3 mmHg. FINDINGS  Left Ventricle: Left ventricular ejection fraction, by estimation, is 60 to 65%. The left ventricle has normal function. The left ventricle has no regional wall motion abnormalities. The left ventricular internal cavity size was normal in size. There is  moderate left ventricular hypertrophy. Left ventricular diastolic parameters are consistent with Grade II diastolic dysfunction (pseudonormalization). Elevated left ventricular end-diastolic pressure. Right Ventricle: The right ventricular size is normal. No increase in right ventricular wall thickness. Right ventricular systolic function is normal. Left Atrium: Left atrial size was normal in size. Right Atrium: Right atrial size was normal in size. Pericardium: There is no evidence of pericardial effusion. Mitral Valve: The mitral valve is normal in structure. There is mild thickening of the mitral valve leaflet(s). There is mild calcification of the mitral valve leaflet(s). No evidence of mitral valve regurgitation. No evidence of mitral valve stenosis. Tricuspid Valve: The tricuspid valve is normal in structure. Tricuspid valve regurgitation is not demonstrated. No evidence of tricuspid stenosis. Aortic Valve: The aortic valve is tricuspid. Aortic valve regurgitation is not visualized. Mild to moderate aortic valve sclerosis/calcification is present, without any evidence of aortic stenosis. Aortic valve mean gradient measures 6.0 mmHg. Aortic valve peak gradient measures 11.6 mmHg. Aortic  valve area, by VTI measures 2.53 cm. Pulmonic Valve: The pulmonic valve was normal in structure. Pulmonic valve regurgitation is not visualized. No evidence of pulmonic stenosis. Aorta: The aortic root is normal in size and structure and aortic dilatation noted. There is mild dilatation of the ascending aorta, measuring 38 mm. Venous: The inferior vena cava is normal in  size with greater than 50% respiratory variability, suggesting right atrial pressure of 3 mmHg. IAS/Shunts: The interatrial septum was not well visualized.  LEFT VENTRICLE PLAX 2D LVIDd:         4.20 cm  Diastology LVIDs:         2.80 cm  LV e' medial:    9.36 cm/s LV PW:         1.70 cm  LV E/e' medial:  13.1 LV IVS:        1.60 cm  LV e' lateral:   7.40 cm/s LVOT diam:     2.10 cm  LV E/e' lateral: 16.6 LV SV:         90 LV SV Index:   41 LVOT Area:     3.46 cm  RIGHT VENTRICLE RV Basal diam:  2.90 cm RV S prime:     12.60 cm/s LEFT ATRIUM             Index       RIGHT ATRIUM           Index LA diam:        3.20 cm 1.47 cm/m  RA Area:     23.20 cm LA Vol (A2C):   71.7 ml 32.94 ml/m RA Volume:   59.70 ml  27.42 ml/m LA Vol (A4C):   81.1 ml 37.25 ml/m LA Biplane Vol: 76.9 ml 35.32 ml/m  AORTIC VALVE AV Area (Vmax):    2.38 cm AV Area (Vmean):   2.37 cm AV Area (VTI):     2.53 cm AV Vmax:           170.00 cm/s AV Vmean:          116.000 cm/s AV VTI:            0.354 m AV Peak Grad:      11.6 mmHg AV Mean Grad:      6.0 mmHg LVOT Vmax:         117.00 cm/s LVOT Vmean:        79.300 cm/s LVOT VTI:          0.259 m LVOT/AV VTI ratio: 0.73  AORTA Ao Asc diam: 3.80 cm MITRAL VALVE MV Area (PHT): 4.80 cm     SHUNTS MV Decel Time: 158 msec     Systemic VTI:  0.26 m MV E velocity: 123.00 cm/s  Systemic Diam: 2.10 cm MV A velocity: 65.50 cm/s MV E/A ratio:  1.88 Charlton Haws MD Electronically signed by Charlton Haws MD Signature Date/Time: 05/10/2020/5:08:01 PM    Final        Medical Problem List and Plan: 1.  TBI/SDH/nondisplaced right occipital bone fracture secondary to unwitnessed fall 05/04/2020  -ELOS/Goals: 18-21 days, supervision to min assist goals with PT, OT, SLP  -will place in enclosure bed for safety. (discussed with son who agrees) 2.  Antithrombotics: -DVT/anticoagulation: Subcutaneous heparin 05/08/2020  -antiplatelet therapy: N/A 3. Pain Management: Oxycodone as needed 4. Mood/sleep:  Melatonin 5 mg nightly as needed.    -Consider low-dose nightly Seroquel  -check sleep chart  -antipsychotic agents: N/A 5. Neuropsych: This patient is not  capable of making decisions on his own behalf. 6. Skin/Wound Care: Routine skin checks 7. Fluids/Electrolytes/Nutrition:   -encourage PO intake  -check labs monday 8.  Seizure prophylaxis.  Keppra 1000 mg mg twice daily 9.  Atrial fibrillation with RVR.  Intravenous Cardizem discontinued.  Continue Coreg 25 mg twice daily  -HR&R controlled and regular at present 10.  Hypertension.  Norvasc 10 mg daily, Coreg 25 mg twice daily.  Monitor with increased mobility 11.  Decreased nutritional storage/Dysphagia:  - Diet has been advanced to regular.    -Currently on nightly tube feeds---will dc today as he's eating fairly well and NGT is serving as a big irritant for him.   -team/family will need to push PO 12.  History of alcohol use.  Monitor for any signs of withdrawal 13.  Constipation.  MiraLAX as directed.  Adjust bowel program as needed. 14.  History recent Covid March and then again December 2021.  Rapid antigen test was negative.  It was felt that Covid positive PCR test indicated dead virus shedding rather than active infection and isolation discontinued.    Mcarthur Rossetti Angiulli, PA-C 05/12/2020

## 2020-05-12 NOTE — Discharge Summary (Signed)
Physician Discharge Summary  Timothy Case ZOX:[id number] DOB: [birth year] DOA: 05/04/2020  PCP: Patient, No Pcp Per  Admit date: 05/04/2020 Discharge date: 05/12/2020  Admitted From: Home Disposition:  CIR  Discharge Condition:Stable CODE STATUS:FULL Diet recommendation:  Regular    Brief/Interim Summary: Patient is a 65 year old male with past medical history of CVA, hypertension, hyperlipidemia who presented to the emergency department with complaints of altered mental status, aphasia, headache. His wife had found him on the floor. Patient was later found to be aphasic and confused. CT imaging done on presentation showed bilateral subdural hematomas, subarachnoid hemorrhage. When he presented he was significantly confused but was following simple commands and moving all his extremities. In the emergency department, he had a seizure that lasted for 2 minutes after that he became postictal and required intubation for airway protection. Patient self extubated on 1/22. He was transferred to [facility] service. Currently hemodynamically stable. PT/OT recommended CIR on discharge.  Following problems were addressed during hospitalization:  Intracerebral hemorrhage/subarachnoid hemorrhage/  subdural hemorrhage/cerebral contusions: Admitted by neurosurgery initially. Neurosurgery was following during this hospitalization. He was also found to have nondisplaced right occipital bone fracture. 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 We recommend to follow-up with neurosurgery as an outpatient.  Acute encephalopathy: Secondary to above. Hospital course remarkable for ongoing agitation, behavioral disturbances, impulsivity.Avoid CNS depressants.  Mental status has significantly improved.  He is alert and oriented today.  Restraints removed  Seizure: Currently on Keppra.  Dysphagia: Currently on NG tube for nutrition support. On regular diet as well. Please  discontinue the NG  tube when his oral intake is optimal. Speech therapy following.Dietician following  A. fib with RVR: Currently on Coreg. Rate is controlled. Anticoagulation not started due to intracerebral hemorrhage. Echocardiogram showed ejection fraction of 60-65%, grade 2 diastolic dysfunction.  Hypertension: On Coreg, amlodipine. Continue to titrate the meds if needed  Covid positive prior to admission: Completed isolation. Asymptomatic. No indication for treatment  Hypokalemia: Continue to monitor and supplement  Ambulatory dysfunction/debility: PT/OT recommend CIR.   Discharge Diagnoses:  Active Problems:   Subdural hematoma (HCC)   Subarachnoid bleed (HCC)   Contusion of face   Subarachnoid hemorrhage following injury, with loss of consciousness (HCC)   Agitation   Hypokalemia    Discharge Instructions  Discharge Instructions    Diet general   Complete by: As directed    Discharge instructions   Complete by: As directed    1)Continue rehabilitation 2)Continue working with the speech therapy and dietitian with goal to take the feeding tube out in next 24 to 48 hours 3)Do a CBC and BMP tests in a week 4)Follow up with neurosurgery in 4 weeks.  Name and number of the provider has been attached. 5)Avoid blood thinners   Increase activity slowly   Complete by: As directed      Allergies as of 05/12/2020   No Known Allergies     Medication List    STOP taking these medications   aspirin EC 81 MG tablet   atorvastatin 20 MG tablet Commonly known as: LIPITOR   gabapentin 100 MG capsule Commonly known as: NEURONTIN   naproxen sodium 220 MG tablet Commonly known as: ALEVE   predniSONE 50 MG tablet Commonly known as: DELTASONE   ramipril 5 MG capsule Commonly known as: ALTACE   traMADol 50 MG tablet Commonly known as: ULTRAM     TAKE these medications   acetaminophen 500 MG tablet Commonly known as: TYLENOL Take 1,000 mg by  mouth every 6 (six) hours as needed  for mild pain or headache.   amLODipine 10 MG tablet Commonly known as: NORVASC Take 1 tablet (10 mg total) by mouth daily. Start taking on: May 13, 2020   carvedilol 25 MG tablet Commonly known as: COREG Take 1 tablet (25 mg total) by mouth 2 (two) times daily with a meal.   feeding supplement (PIVOT 1.5 CAL) Liqd Place 1,000 mLs into feeding tube continuous.   feeding supplement Liqd Take 237 mLs by mouth 2 (two) times daily between meals.   fluticasone 50 MCG/ACT nasal spray Commonly known as: FLONASE Place 2 sprays into the nose daily as needed for allergies.   Glucosamine-Chondroitin 500-250 MG Caps Take 2 capsules by mouth daily.   levETIRAcetam 1000 MG tablet Commonly known as: KEPPRA Take 1 tablet (1,000 mg total) by mouth 2 (two) times daily.   levocetirizine 5 MG tablet Commonly known as: XYZAL Take 5 mg by mouth daily as needed for allergies.   melatonin 5 MG Tabs Take 1 tablet (5 mg total) by mouth at bedtime as needed.   multivitamin with minerals Tabs tablet Take 1 tablet by mouth daily. Start taking on: May 13, 2020   omeprazole 40 MG capsule Commonly known as: PRILOSEC Take 40 mg by mouth daily as needed (acid reflux).   oxyCODONE 5 MG immediate release tablet Commonly known as: Oxy IR/ROXICODONE Take 1 tablet (5 mg total) by mouth every 4 (four) hours as needed for breakthrough pain or severe pain.   polyethylene glycol 17 g packet Commonly known as: MIRALAX / GLYCOLAX Take 17 g by mouth 2 (two) times daily.   rosuvastatin 20 MG tablet Commonly known as: CRESTOR Take 20 mg by mouth daily.   thiamine 100 MG tablet Take 1 tablet (100 mg total) by mouth daily. Start taking on: May 13, 2020       Follow-up Information    Council Mechanic, NP. Schedule an appointment as soon as possible for a visit in 4 week(s).   Specialty: Neurosurgery Contact information: [address] [place] [address] [phone]               No Known Allergies  Consultations: Neurosurgery ,PCCM  Procedures/Studies: CT Angio Head W or Wo Contrast  Result Date: 05/04/2020 CLINICAL DATA:  Patient found down.  Intracranial hemorrhage. EXAM: CT ANGIOGRAPHY HEAD TECHNIQUE: Multidetector CT imaging of the head was performed using the standard protocol during bolus administration of intravenous contrast. Multiplanar CT image reconstructions and MIPs were obtained to evaluate the vascular anatomy. CONTRAST:  OMNIPAQUE IOHEXOL 350 MG/ML SOLN COMPARISON:  CT head 05/04/2020 FINDINGS: CTA HEAD Anterior circulation: Internal carotid artery widely patent through the skull base. Hypoplastic right A1 segment. Anterior and middle cerebral arteries widely patent without stenosis. Negative for vascular malformation. Posterior circulation: Both vertebral arteries patent to the basilar. Left PICA patent. Right PICA not visualized. Right AICA patent. Basilar widely patent. Superior cerebellar and posterior cerebral arteries patent without stenosis. Fetal origin right posterior cerebral artery. Negative for vascular malformation. Venous sinuses: Limited venous enhancement due to arterial phase scanning. Anatomic variants: None IMPRESSION: Negative CTA head.  No vascular malformation Intracranial hemorrhage described on prior CT head report. Electronically Signed   By: Marlan Palau M.D.   On: 05/04/2020 19:41   CT HEAD WO CONTRAST  Result Date: 05/05/2020 CLINICAL DATA:  Follow-up subdural hematoma EXAM: CT HEAD WITHOUT CONTRAST TECHNIQUE: Contiguous axial images were obtained from the base  of the skull through the vertex without intravenous contrast. COMPARISON:  Yesterday FINDINGS: Brain: Expected blooming of hemorrhagic contusions in the bilateral inferior frontal lobes and right superficial cerebellum. Mild increase versus redistribution of subarachnoid hemorrhage scattered along bilateral convexities. Small subdural hematoma on both  sides, measuring up to 7 mm in the right parietal region and 3-4 mm along the posterior left falx. Small volume intraventricular hemorrhage at the occipital horn of the left lateral ventricle. No acute infarct.  No hydrocephalus. Vascular: Negative Skull: Nondisplaced right occipital bone fracture reaching the midline superiorly. Sinuses/Orbits: Fluid and patchy sinus opacification in the setting of intubation. IMPRESSION: 1. Expected blooming of hemorrhagic contusions in the bilateral inferior frontal lobes and right cerebellum. 2. Traumatic pattern subarachnoid hemorrhage with either increase or diffusion since prior. 3. Slight increase in the right frontal parietal subdural hematoma which measures up to 7 mm in thickness. Minimal left parafalcine subdural hemorrhage. 4. No hydrocephalus. 5. Nondisplaced right occipital bone fracture. Electronically Signed   By: Marnee Spring M.D.   On: 05/05/2020 05:43   CT HEAD WO CONTRAST  Result Date: 05/04/2020 CLINICAL DATA:  Patient found down.  Fall.  Headache last night. EXAM: CT HEAD WITHOUT CONTRAST CT MAXILLOFACIAL WITHOUT CONTRAST CT CERVICAL SPINE WITHOUT CONTRAST TECHNIQUE: Multidetector CT imaging of the head, cervical spine, and maxillofacial structures were performed using the standard protocol without intravenous contrast. Multiplanar CT image reconstructions of the cervical spine and maxillofacial structures were also generated. COMPARISON:  CT head 04/21/2012. FINDINGS: CT HEAD FINDINGS Brain: Small amount of subarachnoid hemorrhage in the sylvian fissure bilaterally. Mild diffuse subarachnoid hemorrhage including right occipital lobe. Basilar cisterns without significant subarachnoid hemorrhage. Bifrontal hemorrhagic contusions. Small amount of frontal subarachnoid hemorrhage. Small hemorrhagic contusion in the right cerebellum. Small left frontal subdural hematoma. Right subdural hematoma measuring approximately 6 mm in thickness over the convexity.  Ventricle size normal.  No midline shift.  No acute infarct or mass. Vascular: No hyperdense vessel. Blood in the sylvian fissure obscures the middle cerebral artery bilaterally. Skull: Negative for skull fracture. Other: None CT MAXILLOFACIAL FINDINGS Osseous: Negative for facial fracture Orbits: No fracture of the orbit. Negative for orbital mass or hematoma. Normal globe. There is soft tissue swelling overlying the left eye Sinuses: Mucosal edema throughout the paranasal sinuses without air-fluid level Soft tissues: Moderate soft tissue swelling overlying the left eye compatible with contusion. CT CERVICAL SPINE FINDINGS Alignment: Mild anterolisthesis C4-5 Skull base and vertebrae: Negative for fracture. Motion degraded study. Soft tissues and spinal canal: Negative Disc levels: Multilevel disc and facet degeneration throughout the cervical spine. Multilevel foraminal stenosis. Mild spinal stenosis C5-6 and C6-7. Upper chest: Not image Other: None IMPRESSION: 1. Bilateral small subdural hematomas without midline shift. Diffuse subarachnoid hemorrhage which is predominately in the sylvian fissures with sparing of the basilar cisterns. There are bifrontal hemorrhagic contusions. There is a small left cerebellar hemorrhagic contusion. Pattern most consistent with trauma rather than aneurysm rupture. 2. Negative for facial fracture 3. Negative for cervical spine fracture. Electronically Signed   By: Marlan Palau M.D.   On: 05/04/2020 16:08   CT Cervical Spine Wo Contrast  Result Date: 05/04/2020 CLINICAL DATA:  Patient found down.  Fall.  Headache last night. EXAM: CT HEAD WITHOUT CONTRAST CT MAXILLOFACIAL WITHOUT CONTRAST CT CERVICAL SPINE WITHOUT CONTRAST TECHNIQUE: Multidetector CT imaging of the head, cervical spine, and maxillofacial structures were performed using the standard protocol without intravenous contrast. Multiplanar CT image reconstructions of the cervical spine and maxillofacial structures  were also generated. COMPARISON:  CT head 04/21/2012. FINDINGS: CT HEAD FINDINGS Brain: Small amount of subarachnoid hemorrhage in the sylvian fissure bilaterally. Mild diffuse subarachnoid hemorrhage including right occipital lobe. Basilar cisterns without significant subarachnoid hemorrhage. Bifrontal hemorrhagic contusions. Small amount of frontal subarachnoid hemorrhage. Small hemorrhagic contusion in the right cerebellum. Small left frontal subdural hematoma. Right subdural hematoma measuring approximately 6 mm in thickness over the convexity. Ventricle size normal.  No midline shift.  No acute infarct or mass. Vascular: No hyperdense vessel. Blood in the sylvian fissure obscures the middle cerebral artery bilaterally. Skull: Negative for skull fracture. Other: None CT MAXILLOFACIAL FINDINGS Osseous: Negative for facial fracture Orbits: No fracture of the orbit. Negative for orbital mass or hematoma. Normal globe. There is soft tissue swelling overlying the left eye Sinuses: Mucosal edema throughout the paranasal sinuses without air-fluid level Soft tissues: Moderate soft tissue swelling overlying the left eye compatible with contusion. CT CERVICAL SPINE FINDINGS Alignment: Mild anterolisthesis C4-5 Skull base and vertebrae: Negative for fracture. Motion degraded study. Soft tissues and spinal canal: Negative Disc levels: Multilevel disc and facet degeneration throughout the cervical spine. Multilevel foraminal stenosis. Mild spinal stenosis C5-6 and C6-7. Upper chest: Not image Other: None IMPRESSION: 1. Bilateral small subdural hematomas without midline shift. Diffuse subarachnoid hemorrhage which is predominately in the sylvian fissures with sparing of the basilar cisterns. There are bifrontal hemorrhagic contusions. There is a small left cerebellar hemorrhagic contusion. Pattern most consistent with trauma rather than aneurysm rupture. 2. Negative for facial fracture 3. Negative for cervical spine fracture.  Electronically Signed   By: Marlan Palau M.D.   On: 05/04/2020 16:08   US RENAL  Result Date: 05/06/2020 CLINICAL DATA:  Renal failure EXAM: RENAL / URINARY TRACT ULTRASOUND COMPLETE COMPARISON:  03/22/2013 abdominal CT FINDINGS: Right Kidney: Renal measurements: 10.6 x 6.7 x 6.2 cm = volume: 230 mL. Renal dimensions are favored to be overestimated due to positioning of the kidney. Indeterminate between renal lobulation or isoechoic mass measuring 2.3 cm, arising anteriorly from the upper kidney. No hydronephrosis. Left renal cyst measuring 2.9 cm, also seen in 2014. Left Kidney: Renal measurements: 11.6 x 5.5 x 4.9 cm = volume: 160 mL. No hydronephrosis. Peripelvic cyst at the inferior pole, also seen on prior. Bladder: Catheterized bladder which was not visualized sonographically. IMPRESSION: 1. No hydronephrosis or acute finding. 2. Cortical lobulation versus mass at the upper pole right kidney, measuring approximately 2.3 cm. Given the technical difficulty in this ICU patient, suggest repeat ultrasound in the outpatient setting as the means of initial workup. Electronically Signed   By: Marnee Spring M.D.   On: 05/06/2020 06:01   DG CHEST PORT 1 VIEW  Result Date: 05/07/2020 CLINICAL DATA:  65 year old male with history of respiratory distress. EXAM: PORTABLE CHEST 1 VIEW COMPARISON:  Chest CT 05/04/2020. FINDINGS: Lung volumes are low. Patchy multifocal ill-defined opacities and areas of interstitial prominence throughout the lungs bilaterally, most severe throughout the mid to lower lungs, favored to reflect multilobar bilateral pneumonia. No pleural effusions. Pulmonary vasculature is largely obscured. Heart size is borderline enlarged. Upper mediastinal contours are within normal limits. IMPRESSION: 1. The appearance of the chest is favored to reflect multilobar bilateral pneumonia, concerning for potential viral pneumonia such as COVID infection. Electronically Signed   By: Trudie Reed M.D.    On: 05/07/2020 18:13   DG Chest Portable 1 View  Result Date: 05/04/2020 CLINICAL DATA:  Intubated EXAM: PORTABLE CHEST 1 VIEW COMPARISON:  04/21/2012 FINDINGS: Endotracheal  tube tip about 2.4 cm superior to carina. Esophageal tube tip positioned beneath the medial left diaphragm over the gastric cardia. Mild cardiomegaly. Mild atelectasis left base. No pneumothorax. IMPRESSION: Endotracheal tube tip about 2.4 cm superior to carina. Esophageal tube tip positioned beneath the medial left diaphragm over the gastric cardia. Mild cardiomegaly with subsegmental atelectasis left base. Electronically Signed   By: Jasmine Pang M.D.   On: 05/04/2020 16:55   ECHOCARDIOGRAM COMPLETE  Result Date: 05/10/2020    ECHOCARDIOGRAM REPORT   Patient Name:   Timothy Case [facility] Date of Exam: 05/10/2020 Medical Rec #:  [id number]                    Height:       69.0 in Accession #:    [id number]                   Weight:       226.2 lb Date of Birth:  05/15/1955                    BSA:          2.177 m Patient Age:    64 years                     BP:           146/82 mmHg Patient Gender: M                            HR:           54 bpm. Exam Location:  Inpatient Procedure: 2D Echo, Cardiac Doppler and Color Doppler Indications:    Atrial fibrillation  History:        Patient has prior history of Echocardiogram examinations. Risk                 Factors:Hypertension and Dyslipidemia. GERD.  Sonographer:    Ross Ludwig RDCS (AE) Referring Phys: [id number] Josephine Igo  Sonographer Comments: Suboptimal subcostal window. IMPRESSIONS  1. Left ventricular ejection fraction, by estimation, is 60 to 65%. The left ventricle has normal function. The left ventricle has no regional wall motion abnormalities. There is moderate left ventricular hypertrophy. Left ventricular diastolic parameters are consistent with Grade II diastolic dysfunction (pseudonormalization). Elevated left ventricular end-diastolic pressure.  2. Right  ventricular systolic function is normal. The right ventricular size is normal.  3. The mitral valve is normal in structure. No evidence of mitral valve regurgitation. No evidence of mitral stenosis.  4. The aortic valve is tricuspid. Aortic valve regurgitation is not visualized. Mild to moderate aortic valve sclerosis/calcification is present, without any evidence of aortic stenosis.  5. Aortic dilatation noted. There is mild dilatation of the ascending aorta, measuring 38 mm.  6. The inferior vena cava is normal in size with greater than 50% respiratory variability, suggesting right atrial pressure of 3 mmHg. FINDINGS  Left Ventricle: Left ventricular ejection fraction, by estimation, is 60 to 65%. The left ventricle has normal function. The left ventricle has no regional wall motion abnormalities. The left ventricular internal cavity size was normal in size. There is  moderate left ventricular hypertrophy. Left ventricular diastolic parameters are consistent with Grade II diastolic dysfunction (pseudonormalization). Elevated left ventricular end-diastolic pressure. Right Ventricle: The right ventricular size is normal. No increase in right ventricular wall thickness. Right ventricular systolic function is normal. Left Atrium: Left atrial  size was normal in size. Right Atrium: Right atrial size was normal in size. Pericardium: There is no evidence of pericardial effusion. Mitral Valve: The mitral valve is normal in structure. There is mild thickening of the mitral valve leaflet(s). There is mild calcification of the mitral valve leaflet(s). No evidence of mitral valve regurgitation. No evidence of mitral valve stenosis. Tricuspid Valve: The tricuspid valve is normal in structure. Tricuspid valve regurgitation is not demonstrated. No evidence of tricuspid stenosis. Aortic Valve: The aortic valve is tricuspid. Aortic valve regurgitation is not visualized. Mild to moderate aortic valve sclerosis/calcification is  present, without any evidence of aortic stenosis. Aortic valve mean gradient measures 6.0 mmHg. Aortic valve peak gradient measures 11.6 mmHg. Aortic valve area, by VTI measures 2.53 cm. Pulmonic Valve: The pulmonic valve was normal in structure. Pulmonic valve regurgitation is not visualized. No evidence of pulmonic stenosis. Aorta: The aortic root is normal in size and structure and aortic dilatation noted. There is mild dilatation of the ascending aorta, measuring 38 mm. Venous: The inferior vena cava is normal in size with greater than 50% respiratory variability, suggesting right atrial pressure of 3 mmHg. IAS/Shunts: The interatrial septum was not well visualized.  LEFT VENTRICLE PLAX 2D LVIDd:         4.20 cm  Diastology LVIDs:         2.80 cm  LV e' medial:    9.36 cm/s LV PW:         1.70 cm  LV E/e' medial:  13.1 LV IVS:        1.60 cm  LV e' lateral:   7.40 cm/s LVOT diam:     2.10 cm  LV E/e' lateral: 16.6 LV SV:         90 LV SV Index:   41 LVOT Area:     3.46 cm  RIGHT VENTRICLE RV Basal diam:  2.90 cm RV S prime:     12.60 cm/s LEFT ATRIUM             Index       RIGHT ATRIUM           Index LA diam:        3.20 cm 1.47 cm/m  RA Area:     23.20 cm LA Vol (A2C):   71.7 ml 32.94 ml/m RA Volume:   59.70 ml  27.42 ml/m LA Vol (A4C):   81.1 ml 37.25 ml/m LA Biplane Vol: 76.9 ml 35.32 ml/m  AORTIC VALVE AV Area (Vmax):    2.38 cm AV Area (Vmean):   2.37 cm AV Area (VTI):     2.53 cm AV Vmax:           170.00 cm/s AV Vmean:          116.000 cm/s AV VTI:            0.354 m AV Peak Grad:      11.6 mmHg AV Mean Grad:      6.0 mmHg LVOT Vmax:         117.00 cm/s LVOT Vmean:        79.300 cm/s LVOT VTI:          0.259 m LVOT/AV VTI ratio: 0.73  AORTA Ao Asc diam: 3.80 cm MITRAL VALVE MV Area (PHT): 4.80 cm     SHUNTS MV Decel Time: 158 msec     Systemic VTI:  0.26 m MV E velocity: 123.00 cm/s  Systemic Diam: 2.10 cm MV  A velocity: 65.50 cm/s MV E/A ratio:  1.88 Charlton Haws MD Electronically signed by  Charlton Haws MD Signature Date/Time: 05/10/2020/5:08:01 PM    Final    CT Maxillofacial Wo Contrast  Result Date: 05/04/2020 CLINICAL DATA:  Patient found down.  Fall.  Headache last night. EXAM: CT HEAD WITHOUT CONTRAST CT MAXILLOFACIAL WITHOUT CONTRAST CT CERVICAL SPINE WITHOUT CONTRAST TECHNIQUE: Multidetector CT imaging of the head, cervical spine, and maxillofacial structures were performed using the standard protocol without intravenous contrast. Multiplanar CT image reconstructions of the cervical spine and maxillofacial structures were also generated. COMPARISON:  CT head 04/21/2012. FINDINGS: CT HEAD FINDINGS Brain: Small amount of subarachnoid hemorrhage in the sylvian fissure bilaterally. Mild diffuse subarachnoid hemorrhage including right occipital lobe. Basilar cisterns without significant subarachnoid hemorrhage. Bifrontal hemorrhagic contusions. Small amount of frontal subarachnoid hemorrhage. Small hemorrhagic contusion in the right cerebellum. Small left frontal subdural hematoma. Right subdural hematoma measuring approximately 6 mm in thickness over the convexity. Ventricle size normal.  No midline shift.  No acute infarct or mass. Vascular: No hyperdense vessel. Blood in the sylvian fissure obscures the middle cerebral artery bilaterally. Skull: Negative for skull fracture. Other: None CT MAXILLOFACIAL FINDINGS Osseous: Negative for facial fracture Orbits: No fracture of the orbit. Negative for orbital mass or hematoma. Normal globe. There is soft tissue swelling overlying the left eye Sinuses: Mucosal edema throughout the paranasal sinuses without air-fluid level Soft tissues: Moderate soft tissue swelling overlying the left eye compatible with contusion. CT CERVICAL SPINE FINDINGS Alignment: Mild anterolisthesis C4-5 Skull base and vertebrae: Negative for fracture. Motion degraded study. Soft tissues and spinal canal: Negative Disc levels: Multilevel disc and facet degeneration throughout  the cervical spine. Multilevel foraminal stenosis. Mild spinal stenosis C5-6 and C6-7. Upper chest: Not image Other: None IMPRESSION: 1. Bilateral small subdural hematomas without midline shift. Diffuse subarachnoid hemorrhage which is predominately in the sylvian fissures with sparing of the basilar cisterns. There are bifrontal hemorrhagic contusions. There is a small left cerebellar hemorrhagic contusion. Pattern most consistent with trauma rather than aneurysm rupture. 2. Negative for facial fracture 3. Negative for cervical spine fracture. Electronically Signed   By: Marlan Palau M.D.   On: 05/04/2020 16:08       Subjective: Patient seen and examined the bedside this morning.  Hemodynamically stable for discharge today.  Discharge Exam: Vitals:   05/12/20 1030 05/12/20 1100  BP:  133/80  Pulse: 64 70  Resp: 18 (!) 21  Temp:    SpO2: 100% 100%   Vitals:   05/12/20 0953 05/12/20 1000 05/12/20 1030 05/12/20 1100  BP: (!) 150/94 110/72  133/80  Pulse:  61 64 70  Resp:  14 18 (!) 21  Temp:      TempSrc:      SpO2:  100% 100% 100%  Weight:      Height:        General: Pt is alert, awake, not in acute distress,obese Cardiovascular: RRR, S1/S2 +, no rubs, no gallops Respiratory: CTA bilaterally, no wheezing, no rhonchi Abdominal: Soft, NT, ND, bowel sounds + Extremities: no edema, no cyanosis    The results of significant diagnostics from this hospitalization (including imaging, microbiology, ancillary and laboratory) are listed below for reference.     Microbiology: Recent Results (from the past 240 hour(s))  SARS Coronavirus 2 by RT PCR (hospital order, performed in [facility] hospital lab) Nasopharyngeal Nasopharyngeal Swab     Status: Abnormal   Collection Time: 05/04/20  5:05 PM  Specimen: Nasopharyngeal Swab  Result Value Ref Range Status   SARS Coronavirus 2 POSITIVE (A) NEGATIVE Final    Comment: RESULT CALLED TO, READ BACK BY AND VERIFIED WITH: Kerby Moors RN  1913 05/04/20 A BROWNING (NOTE) SARS-CoV-2 target nucleic acids are DETECTED  SARS-CoV-2 RNA is generally detectable in upper respiratory specimens  during the acute phase of infection.  Positive results are indicative  of the presence of the identified virus, but do not rule out bacterial infection or co-infection with other pathogens not detected by the test.  Clinical correlation with patient history and  other diagnostic information is necessary to determine patient infection status.  The expected result is negative.  Fact Sheet for Patients:   BoilerBrush.com.cy   Fact Sheet for Healthcare Providers:   https://pope.com/    This test is not yet approved or cleared by the [place] FDA and  has been authorized for detection and/or diagnosis of SARS-CoV-2 by FDA under an Emergency Use Authorization (EUA).  This EUA will remain in effect (meaning this te st can be used) for the duration of  the COVID-19 declaration under Section 564(b)(1) of the Act, 21 U.S.C. section 360-bbb-3(b)(1), unless the authorization is terminated or revoked sooner.  Performed at [facility], [address], [place], [place] [address]   Urine culture     Status: None   Collection Time: 05/04/20  5:09 PM   Specimen: Urine, Random  Result Value Ref Range Status   Specimen Description URINE, RANDOM  Final   Special Requests NONE  Final   Culture   Final    NO GROWTH Performed at [facility], [address] [place], [place] [address]    Report Status 05/06/2020 FINAL  Final  MRSA PCR Screening     Status: None   Collection Time: 05/05/20  4:31 AM   Specimen: Nasal Mucosa; Nasopharyngeal  Result Value Ref Range Status   MRSA by PCR NEGATIVE NEGATIVE Final    Comment:        The GeneXpert MRSA Assay (FDA approved for NASAL specimens only), is one component of a comprehensive MRSA colonization surveillance program. It is  not intended to diagnose MRSA infection nor to guide or monitor treatment for MRSA infections. Performed at [facility], [address], [place], [place] [address]      Labs: BNP (last 3 results) No results for input(s): BNP in the last 8760 hours. Basic Metabolic Panel: Recent Labs  Lab 05/08/20 0628 05/08/20 1618 05/08/20 1803 05/09/20 0804 05/09/20 1832 05/10/20 0712 05/10/20 1702 05/11/20 0706 05/11/20 1625 05/12/20 0601  NA 141  --  140 140  --  137  --  138  --  136  K 3.3*  --  3.3* 4.0  --  3.2*  --  3.5  --  3.8  CL 104  --   --  106  --  103  --  102  --  100  CO2 22  --   --  24  --  23  --  25  --  25  GLUCOSE 106*  --   --  150*  --  136*  --  121*  --  128*  BUN 11  --   --  21  --  17  --  22  --  27*  CREATININE 1.12  --   --  0.89  --  0.80  --  0.80  --  0.91  CALCIUM  8.5*  --   --  8.5*  --  8.9  --  9.0  --  8.6*  MG 1.5*   < >  --  2.0   < > 1.8 2.0 1.9 2.0 1.9  PHOS 3.7   < >  --  2.6   < > 2.9 3.5 3.9 4.2 4.5   < > = values in this interval not displayed.   Liver Function Tests: No results for input(s): AST, ALT, ALKPHOS, BILITOT, PROT, ALBUMIN in the last 168 hours. No results for input(s): LIPASE, AMYLASE in the last 168 hours. No results for input(s): AMMONIA in the last 168 hours. CBC: Recent Labs  Lab 05/08/20 0628 05/08/20 1803 05/09/20 0947 05/10/20 0712 05/11/20 0706 05/12/20 0601  WBC 9.4  --  9.3 11.0* 11.2* 11.5*  NEUTROABS 6.9  --  6.9 7.9* 8.2* 8.0*  HGB 13.9 12.9* 13.8 14.1 14.7 14.0  HCT 42.3 38.0* 40.1 42.0 44.5 42.5  MCV 94.8  --  92.6 91.9 92.7 93.8  PLT 194  --  149* 168 209 224   Cardiac Enzymes: Recent Labs  Lab 05/05/20 1641  CKTOTAL 150   BNP: Invalid input(s): POCBNP CBG: Recent Labs  Lab 05/11/20 1527 05/11/20 1910 05/11/20 2304 05/12/20 0306 05/12/20 0723  GLUCAP 143* 143* 135* 147* 125*   D-Dimer No results for input(s): DDIMER in the last 72 hours. Hgb A1c No results for input(s):  HGBA1C in the last 72 hours. Lipid Profile No results for input(s): CHOL, HDL, LDLCALC, TRIG, CHOLHDL, LDLDIRECT in the last 72 hours. Thyroid function studies No results for input(s): TSH, T4TOTAL, T3FREE, THYROIDAB in the last 72 hours.  Invalid input(s): FREET3 Anemia work up No results for input(s): VITAMINB12, FOLATE, FERRITIN, TIBC, IRON, RETICCTPCT in the last 72 hours. Urinalysis    Component Value Date/Time   COLORURINE YELLOW 05/05/2020 1615   APPEARANCEUR CLEAR 05/05/2020 1615   LABSPEC 1.031 (H) 05/05/2020 1615   PHURINE 5.0 05/05/2020 1615   GLUCOSEU NEGATIVE 05/05/2020 1615   HGBUR SMALL (A) 05/05/2020 1615   BILIRUBINUR NEGATIVE 05/05/2020 1615   KETONESUR NEGATIVE 05/05/2020 1615   PROTEINUR 30 (A) 05/05/2020 1615   UROBILINOGEN 0.2 04/21/2012 1054   NITRITE NEGATIVE 05/05/2020 1615   LEUKOCYTESUR NEGATIVE 05/05/2020 1615   Sepsis Labs Invalid input(s): PROCALCITONIN,  WBC,  LACTICIDVEN Microbiology Recent Results (from the past 240 hour(s))  SARS Coronavirus 2 by RT PCR (hospital order, performed in [facility] hospital lab) Nasopharyngeal Nasopharyngeal Swab     Status: Abnormal   Collection Time: 05/04/20  5:05 PM   Specimen: Nasopharyngeal Swab  Result Value Ref Range Status   SARS Coronavirus 2 POSITIVE (A) NEGATIVE Final    Comment: RESULT CALLED TO, READ BACK BY AND VERIFIED WITH: Kerby Moors KOHUT RN 1913 05/04/20 A BROWNING (NOTE) SARS-CoV-2 target nucleic acids are DETECTED  SARS-CoV-2 RNA is generally detectable in upper respiratory specimens  during the acute phase of infection.  Positive results are indicative  of the presence of the identified virus, but do not rule out bacterial infection or co-infection with other pathogens not detected by the test.  Clinical correlation with patient history and  other diagnostic information is necessary to determine patient infection status.  The expected result is negative.  Fact Sheet for Patients:    BoilerBrush.com.cyhttps://www.fda.gov/media/136312/download   Fact Sheet for Healthcare Providers:   https://pope.com/https://www.fda.gov/media/136313/download    This test is not yet approved or cleared by the [place] FDA and  has been authorized for detection  and/or diagnosis of SARS-CoV-2 by FDA under an Emergency Use Authorization (EUA).  This EUA will remain in effect (meaning this te st can be used) for the duration of  the COVID-19 declaration under Section 564(b)(1) of the Act, 21 U.S.C. section 360-bbb-3(b)(1), unless the authorization is terminated or revoked sooner.  Performed at [facility], [address] [place], [place] [address]   Urine culture     Status: None   Collection Time: 05/04/20  5:09 PM   Specimen: Urine, Random  Result Value Ref Range Status   Specimen Description URINE, RANDOM  Final   Special Requests NONE  Final   Culture   Final    NO GROWTH Performed at [facility], [address] [place], [place] [address]    Report Status 05/06/2020 FINAL  Final  MRSA PCR Screening     Status: None   Collection Time: 05/05/20  4:31 AM   Specimen: Nasal Mucosa; Nasopharyngeal  Result Value Ref Range Status   MRSA by PCR NEGATIVE NEGATIVE Final    Comment:        The GeneXpert MRSA Assay (FDA approved for NASAL specimens only), is one component of a comprehensive MRSA colonization surveillance program. It is not intended to diagnose MRSA infection nor to guide or monitor treatment for MRSA infections. Performed at [facility], [address], [place], [place] [address]     Please note: You were cared for by a hospitalist during your hospital stay. Once you are discharged, your primary care physician will handle any further medical issues. Please note that NO REFILLS for any discharge medications will be authorized once you are discharged, as it is imperative that you return to your primary care physician (or establish a relationship with a primary care  physician if you do not have one) for your post hospital discharge needs so that they can reassess your need for medications and monitor your lab values.    Time coordinating discharge: 40 minutes  SIGNED:   Burnadette Pop, MD  [facility] 05/12/2020, 11:24 AM Pager [phone]  If 7PM-7AM, please contact night-coverage [email or web address] Password [email or web address]

## 2020-05-12 NOTE — Progress Notes (Signed)
Inpatient Rehabilitation-Admissions Coordinator   Met with pt and his son bedside and spoke with his wife via phone. Notified them of bed offer in CIR and they have accepted. Reviewed insurance benefit letter and consent forms with his wife. All questions answered. Received medical clearance from Dr. Tawanna Solo for admit to CIR. RN and [facility] team aware of plan for today.   Please call if questions.   Raechel Ache, OTR/L  Rehab Admissions Coordinator  [phone] 05/12/2020 1:49 PM

## 2020-05-13 DIAGNOSIS — S069X0S Unspecified intracranial injury without loss of consciousness, sequela: Secondary | ICD-10-CM | POA: Diagnosis not present

## 2020-05-13 MED ORDER — TRAZODONE HCL 50 MG PO TABS
50.0000 mg | ORAL_TABLET | Freq: Every evening | ORAL | Status: DC | PRN
  Administered 2020-05-13 – 2020-05-29 (×13): 50 mg via ORAL
  Filled 2020-05-13 (×17): qty 1

## 2020-05-13 MED ORDER — ENSURE ENLIVE PO LIQD
237.0000 mL | Freq: Three times a day (TID) | ORAL | Status: DC
  Administered 2020-05-13 – 2020-05-28 (×38): 237 mL via ORAL
  Filled 2020-05-13: qty 237

## 2020-05-13 MED ORDER — HYDROCHLOROTHIAZIDE 12.5 MG PO CAPS
12.5000 mg | ORAL_CAPSULE | Freq: Every day | ORAL | Status: DC
  Administered 2020-05-13 – 2020-05-15 (×3): 12.5 mg via ORAL
  Filled 2020-05-13 (×3): qty 1

## 2020-05-13 NOTE — Progress Notes (Signed)
telesitter order in place. Awaiting monitor.

## 2020-05-13 NOTE — Evaluation (Signed)
Occupational Therapy Assessment and Plan  Patient Details  Name: Timothy Case MRN: [id number] Date of Birth: Nov 23, 1955  OT Diagnosis: abnormal posture, acute pain, cognitive deficits, disturbance of vision, muscle weakness (generalized) and pain in joint Rehab Potential: Rehab Potential (ACUTE ONLY): Good ELOS: 3-3.5 weeks   Today's Date: 05/13/2020 OT Individual Time: 1100-1210 OT Individual Time Calculation (min): 70 min     Hospital Problem: Principal Problem:   TBI (traumatic brain injury) ([place])   Past Medical History:  Past Medical History:  Diagnosis Date  . Chronic lower back pain    "q am; cause I'm overweight" (04/21/2012)  . Essential familial hyperlipidemia   . GERD (gastroesophageal reflux disease)   . Hyperlipidemia   . Hypertension   . Shortness of breath    "lying down; sometimes" (04/21/2012)  . Stroke ([place]) 04/21/2012   "they are leaning towards mini stroke today; all S/S still gone now except little chest pain" (04/21/2012)   Past Surgical History:  Past Surgical History:  Procedure Laterality Date  . KNEE ARTHROSCOPY  ~ 2008   "both knees; ~ 6 months apart" (04/21/2012)    Assessment & Plan Clinical Impression: 65 year old man presented 05/04/20 with history of several days of severe headache. Wife found him on the ground with facial trauma complaining of headache. Became increasingly aphasic, confused, and then obtunded. Had seizure in ED. Was intubated for airway protection. CT head small SAH bil sylvian fissure, SAH rt occipital lobe, rt frontoparietal SDH, bifrontal hemorrhagic contusions, contusion rt cerebellum, rt occipital bone fracture;  1/22 self-extubated; agitation   Patient currently requires mod with basic self-care skills secondary to muscle weakness, decreased cardiorespiratoy endurance, impaired timing and sequencing, unbalanced muscle activation and decreased coordination, decreased visual acuity and decreased visual perceptual  skills, decreased initiation, decreased attention, decreased awareness, decreased problem solving, decreased safety awareness, decreased memory and delayed processing and decreased sitting balance, decreased standing balance, decreased postural control and decreased balance strategies.  Prior to hospitalization, patient could complete BADL/IADL with independent .  Patient will benefit from skilled intervention to decrease level of assist with basic self-care skills and increase independence with basic self-care skills prior to discharge home with care partner.  Anticipate patient will require 24 hour supervision and follow up home health.  OT - End of Session Activity Tolerance: Tolerates 10 - 20 min activity with multiple rests Endurance Deficit: Yes OT Assessment Rehab Potential (ACUTE ONLY): Good OT Patient demonstrates impairments in the following area(s): Balance;Cognition;Edema;Endurance;Motor;Nutrition;Pain;Perception;Safety;Behavior;Sensory;Skin Integrity;Vision OT Basic ADL's Functional Problem(s): Grooming;Eating;Bathing;Dressing;Toileting OT Transfers Functional Problem(s): Toilet;Tub/Shower OT Plan OT Intensity: Minimum of 1-2 x/day, 45 to 90 minutes OT Frequency: 5 out of 7 days OT Duration/Estimated Length of Stay: 3-3.5 weeks OT Treatment/Interventions: Balance/vestibular training;Discharge planning;Functional electrical stimulation;Pain management;Self Care/advanced ADL retraining;Therapeutic Activities;UE/LE Coordination activities;Visual/perceptual remediation/compensation;Therapeutic Exercise;Skin care/wound managment;Patient/family education;Functional mobility training;Disease mangement/prevention;Cognitive remediation/compensation;Community reintegration;DME/adaptive equipment instruction;Neuromuscular re-education;Psychosocial support;Splinting/orthotics;UE/LE Strength taining/ROM;Wheelchair propulsion/positioning OT Self Feeding Anticipated Outcome(s): S OT Basic Self-Care  Anticipated Outcome(s): S OT Toileting Anticipated Outcome(s): S OT Bathroom Transfers Anticipated Outcome(s): S OT Recommendation Patient destination: Home Follow Up Recommendations: Outpatient OT Equipment Recommended: 3 in 1 bedside comode   OT Evaluation Precautions/Restrictions  Precautions Precautions: Fall;Other (comment) Restrictions Weight Bearing Restrictions: No General Chart Reviewed: Yes Family/Caregiver Present: Yes Vital Signs  Pain Pain Assessment Pain Scale: 0-10 Pain Score: 8  Faces Pain Scale: Hurts little more Pain Type: Acute pain Pain Location: Back Pain Orientation: Lower Pain Descriptors / Indicators: Aching Pain Onset: Gradual Pain Intervention(s): Medication (See  eMAR) Home Living/Prior Functioning Home Living Living Arrangements: Spouse/significant other Available Help at Discharge: Family Type of Home: House Home Layout: Multi-level,Full bath on main level,Able to live on main level with bedroom/bathroom Bathroom Shower/Tub: Holiday representative Accessibility: Yes  Lives With: Spouse IADL History Leisure and Hobbies: FISHING, boating, [place] out Prior Function Level of Independence: Independent with homemaking with ambulation,Independent with basic ADLs Driving: Yes Leisure: Hobbies-yes (Comment) Vision Baseline Vision/History:  (Only able to see out of R eye at baseline per wife) Vision Assessment?: Vision impaired- to be further tested in functional context Perception  Perception: Impaired Praxis Praxis: Impaired (? affected by arousal) Praxis Impairment Details: Motor planning;Initiation Cognition Arousal/Alertness: Lethargic Orientation Level: Person;Place;Situation Person: Oriented Place: Oriented Situation: Oriented Year: 2022 Month: January Day of Week: Incorrect Memory: Impaired Immediate Memory Recall: Sock;Blue;Bed Memory Recall Sock: Without Cue Memory Recall Blue: Not able to  recall Memory Recall Bed: Not able to recall Focused Attention: Impaired Sustained Attention: Impaired Problem Solving: Impaired Executive Function: Reasoning Reasoning: Impaired Safety/Judgment: Impaired [place] Scales of Cognitive Functioning: Confused/appropriate Sensation Sensation Light Touch: Impaired by gross assessment Coordination Gross Motor Movements are Fluid and Coordinated: Yes Fine Motor Movements are Fluid and Coordinated: No Finger Nose Finger Test: unable to follow commands d/t arousal/cognition Motor  Motor Motor: Abnormal postural alignment and control  Trunk/Postural Assessment  Cervical Assessment Cervical Assessment:  (head forward) Thoracic Assessment Thoracic Assessment:  (rounded shoudlers) Lumbar Assessment Lumbar Assessment:  (post pelvic tilt) Postural Control Postural Control: Deficits on evaluation  Balance Balance Balance Assessed: Yes Dynamic Sitting Balance Dynamic Sitting - Level of Assistance: 4: Min assist Sitting balance - Comments: able to sit EOb without UE support- MIN A for sitting balance d/t decreased arousal at EOB Static Standing Balance Static Standing - Level of Assistance: 3: Mod assist Extremity/Trunk Assessment   generalized weakness overall BUE    Care Tool Care Tool Self Care Eating        Oral Care    Oral Care Assist Level: Moderate Assistance - Patient 50 - 74%    Bathing   Body parts bathed by patient: Right arm;Left arm;Chest;Abdomen;Right upper leg;Left upper leg;Face Body parts bathed by helper: Front perineal area;Buttocks;Right lower leg;Left lower leg   Assist Level: Moderate Assistance - Patient 50 - 74%    Upper Body Dressing(including orthotics)   What is the patient wearing?: Pull over shirt   Assist Level: Minimal Assistance - Patient > 75%    Lower Body Dressing (excluding footwear)   What is the patient wearing?: Incontinence brief;Pants Assist for lower body dressing: Total  Assistance - Patient < 25%    Putting on/Taking off footwear     Assist for footwear: Total Assistance - Patient < 25%       Care Tool Toileting Toileting activity         Care Tool Bed Mobility Roll left and right activity        Sit to lying activity        Lying to sitting edge of bed activity         Care Tool Transfers Sit to stand transfer   Sit to stand assist level: Minimal Assistance - Patient > 75%    Chair/bed transfer         Toilet transfer Toilet transfer activity did not occur: Safety/medical concerns (d/t poor arousal)       Care Tool Cognition Expression of Ideas and Wants Expression of Ideas and Wants:  Frequent difficulty - frequently exhibits difficulty with expressing needs and ideas   Understanding Verbal and Non-Verbal Content Understanding Verbal and Non-Verbal Content: Sometimes understands - understands only basic conversations or simple, direct phrases. Frequently requires cues to understand   Memory/Recall Ability *first 3 days only Memory/Recall Ability *first 3 days only: Current season;That he or she is in a hospital/hospital unit    Refer to Care Plan for [place] 1 OT Short Term Goal 1 (Week 1): Pt will maintain arousal for >30 min to participate in tx session consistently OT Short Term Goal 2 (Week 1): pt will transfer to toilet wiht MIN A OT Short Term Goal 3 (Week 1): Pt will thread BLE into pants OT Short Term Goal 4 (Week 1): Pt will sequence oral care with MOD VC  Recommendations for other services: None    Skilled Therapeutic Intervention 1;1. Pt and wife present in room. Educated on OT role/purpose, CIR ELOS and POC. RN reporting pt didn't sleep at all. Pt easily aroused initially but as session went on pt falling asleep sitting up with sitting balance intermittently maintained based on arousal. Pt vitals 143/93 sitting EOB HR 64 O2 100%. Pt able to bathe/dress EOB despite wanting to shower.  Unsafe d/t arousal. Pt abl eto sit tos tand at EOB with MOD A for OT to manage pants. Significant time spent with wife educating on BI recovery, rancho levels, safety features, schedule, goal setting, home safety/addaptations and DC planning. Exited session with pt seated in bed, exit alarm on and call light in reach   ADL ADL Grooming: Minimal assistance Where Assessed-Grooming: Edge of bed (sitting balance d/t arousal) Upper Body Bathing: Minimal assistance Where Assessed-Upper Body Bathing: Edge of bed (sitting balance A) Lower Body Bathing: Moderate assistance Where Assessed-Lower Body Bathing: Edge of bed Upper Body Dressing: Minimal assistance Where Assessed-Upper Body Dressing: Edge of bed Lower Body Dressing: Maximal assistance Where Assessed-Lower Body Dressing: Edge of bed Toileting: Maximal assistance Where Assessed-Toileting: Bedside Commode Toilet Transfer: Moderate assistance Toilet Transfer Method: Stand pivot Toilet Transfer Equipment: Grab bars Mobility  Transfers Sit to Stand: Moderate Assistance - Patient 50-74% Stand to Sit: Moderate Assistance - Patient 50-74%   Discharge Criteria: Patient will be discharged from OT if patient refuses treatment 3 consecutive times without medical reason, if treatment goals not met, if there is a change in medical status, if patient makes no progress towards goals or if patient is discharged from hospital.  The above assessment, treatment plan, treatment alternatives and goals were discussed and mutually agreed upon: by patient and by family  [name] 05/13/2020, 12:42 PM

## 2020-05-13 NOTE — Progress Notes (Addendum)
[facility] PHYSICAL MEDICINE & REHABILITATION PROGRESS NOTE   Subjective/Complaints:  The patient slept very poorly last night approximately 2 hours total.  He did phone his wife at around 4 AM. The patient is somnolent this morning he arouses and is conversant but then falls asleep again. Review of systems difficult to obtain secondary to his level of alertness  Objective:   No results found. Recent Labs    05/12/20 0601 05/12/20 1818  WBC 11.5* 11.0*  HGB 14.0 13.9  HCT 42.5 42.5  PLT 224 242   Recent Labs    05/11/20 0706 05/12/20 0601 05/12/20 1623  NA 138 136  --   K 3.5 3.8  --   CL 102 100  --   CO2 25 25  --   GLUCOSE 121* 128*  --   BUN 22 27*  --   CREATININE 0.80 0.91 0.92  CALCIUM 9.0 8.6*  --     Intake/Output Summary (Last 24 hours) at 05/13/2020 1215 Last data filed at 05/13/2020 0941 Gross per 24 hour  Intake 360 ml  Output 1580 ml  Net -1220 ml        Physical Exam: Vital Signs Blood pressure (!) 181/101, pulse 74, temperature 98 F (36.7 C), temperature source Oral, resp. rate 20, height 5' 9.02" (1.753 m), weight 103.4 kg, SpO2 98 %.  General: No acute distress, somnolent Mood and affect are without lability or agitation Heart: Regular rate and rhythm no rubs murmurs or extra sounds Lungs: Clear to auscultation, breathing unlabored, no rales or wheezes Abdomen: Positive bowel sounds, soft nontender to palpation, nondistended Extremities: No clubbing, cyanosis, or edema Skin: No evidence of breakdown, no evidence of rash Neurologic difficult to assess manual muscle testing appears to be around 4/5 bilateral upper extremities lower limbs he did not cooperate well  sensory exam unable to cooperate secondary to mental status Cerebellar exam unable to cooperate Musculoskeletal: Full passive range of motion in all 4 extremities. No joint swelling   Assessment/Plan: 1. Functional deficits which require 3+ hours per day of interdisciplinary  therapy in a comprehensive inpatient rehab setting.  Physiatrist is providing close team supervision and 24 hour management of active medical problems listed below.  Physiatrist and rehab team continue to assess barriers to discharge/monitor patient progress toward functional and medical goals  Care Tool:  Bathing              Bathing assist       Upper Body Dressing/Undressing Upper body dressing   What is the patient wearing?: Hospital gown only    Upper body assist Assist Level: Moderate Assistance - Patient 50 - 74%    Lower Body Dressing/Undressing Lower body dressing      What is the patient wearing?: Incontinence brief     Lower body assist Assist for lower body dressing: Dependent - Patient 0%     Toileting Toileting    Toileting assist Assist for toileting: Minimal Assistance - Patient > 75% Assistive Device Comment: Urinal   Transfers Chair/bed transfer  Transfers assist           Locomotion Ambulation   Ambulation assist              Walk 10 feet activity   Assist           Walk 50 feet activity   Assist           Walk 150 feet activity   Assist  Walk 10 feet on uneven surface  activity   Assist           Wheelchair     Assist               Wheelchair 50 feet with 2 turns activity    Assist            Wheelchair 150 feet activity     Assist          Blood pressure (!) 181/101, pulse 74, temperature 98 F (36.7 C), temperature source Oral, resp. rate 20, height 5' 9.02" (1.753 m), weight 103.4 kg, SpO2 98 %.    Medical Problem List and Plan: 1.TBI/SDH/nondisplaced right occipital bone fracturesecondary tounwitnessed fall 05/04/2020 -ELOS/Goals: 18-21 days, supervision to min assist goals with PT, OT, SLP -will place in enclosure bed for safety when one is available.  2. Antithrombotics: -DVT/anticoagulation:Subcutaneous heparin  05/08/2020 -antiplatelet therapy: N/A 3. Pain Management:Oxycodone as needed 4. Mood/sleep:Melatonin 5 mg nightly as needed. -Consider low-dose nightly Seroquel -check sleep chart -antipsychotic agents: N/A 5. Neuropsych: This patientis notcapable of making decisions on hisown behalf. 6. Skin/Wound Care:Routine skin checks 7. Fluids/Electrolytes/Nutrition: -encourage PO intake -check labs monday 8. Seizure prophylaxis. Keppra 1000 mg mg twice daily 9. Atrial fibrillation with RVR. Intravenous Cardizem discontinued. Continue Coreg 25 mg twice daily -HR&R controlled and regular at present 10. Hypertension. Norvasc 10 mg daily, Coreg 25 mg twice daily. Monitor with increased mobility Vitals:   05/12/20 1957 05/13/20 0347  BP: (!) 159/84 (!) 181/101  Pulse: 70 74  Resp: 20 20  Temp: 98.9 F (37.2 C) 98 F (36.7 C)  SpO2: 100% 98%  Heart rate well controlled, pressure still up we will add HCTZ 11. Decreased nutritional storage/Dysphagia: -Diet has been advanced to regular.  -Currently on nightly tube feeds---will dc today as he's eating fairly well and NGT is serving as a big irritant for him.  -team/family will need to push PO 12. History of alcohol use. Monitor for any signs of withdrawal 13. Constipation. MiraLAX as directed. Adjust bowel program as needed. 14. History recent Covid March and then again December 2021. Rapid antigen test was negative. It was felt that Covid positive PCR test indicated dead virus shedding rather than active infection and isolation discontinued. #15 insomnia add sleep graph and trazodone LOS: 1 days A FACE TO FACE EVALUATION WAS PERFORMED  Erick Colace 05/13/2020, 12:15 PM

## 2020-05-13 NOTE — Progress Notes (Signed)
Patient telesitter discontinued-no camera available at this time. Restrain order back in place as enclosure bed is now available and initially requested. Patient very drowsy today with frequent intermittent bouts of restlessness and confusion. Per report patient very restless last HS and did not sleep. Appetite remains poor but patient able to consume about 50% of dinner. Patient requiring full supervision for encouragement and occasional initiation.

## 2020-05-13 NOTE — Progress Notes (Signed)
Initial Nutrition Assessment  DOCUMENTATION CODES:   Not applicable  INTERVENTION:    Increase Ensure Enlive po TID, each supplement provides 350 kcal and 20 grams of protein  MVI daily   NUTRITION DIAGNOSIS:   Increased nutrient needs related to acute illness (COVID infection) as evidenced by estimated needs.  GOAL:   Patient will meet greater than or equal to 90% of their needs  MONITOR:   PO intake,Supplement acceptance,TF tolerance,Skin,Weight trends,Labs,I & O's  REASON FOR ASSESSMENT:   Consult Enteral/tube feeding initiation and management  ASSESSMENT:   Patient with PMH significant for GERD, HLD, HTN, CVA, and recent COVID infection. Admitted to [facility] for bilateral SDH, diffuse SAH, bilateral frontal contusions, and mall left cerebellar hemorrhagic contusion. Transferred to [facility] for comprehensive rehab program.   1/22- self extubated 1/24- gastric Cortrak placed  1/26- diet advanced to regular 1/28- Cortrak pulled per MD  RD consulted for enteral nutrition initiation. Patient lacks access as tube was pulled yesterday. Per patient, he's finishing 50% of meals and taking Ensure BID. Discussed the importance of protein intake for preservation of lean body mass. Patient willing to increase Ensure to TID.   Weight noted to remains stable since March 2021. Patient denies recent weight loss.   UOP: 1580 ml x 24 hrs   Medications: colace, miralax, thiamine  Labs: CBG 125-147  Diet Order:   Diet Order            Diet regular Room service appropriate? Yes; Fluid consistency: Thin  Diet effective now                 EDUCATION NEEDS:   Education needs have been addressed  Skin:  Skin Assessment: Reviewed RN Assessment  Last BM:  1/28  Height:   Ht Readings from Last 1 Encounters:  05/12/20 5' 9.02" (1.753 m)    Weight:   Wt Readings from Last 1 Encounters:  05/12/20 103.4 kg    BMI:  Body mass index is 33.65 kg/m.  Estimated Nutritional Needs:    Kcal:  2200-2400 kcal  Protein:  110-125 grams  Fluid:  >/= 2 L/day  Vanessa Kick RD, LDN Clinical Nutrition Pager listed in AMION

## 2020-05-13 NOTE — Progress Notes (Signed)
Patient awake all night and on the call bell several times every hour. Patient started to get out of bed on 3 occassions but did not getup. Patient very polite and easily directed but restless.

## 2020-05-13 NOTE — Evaluation (Signed)
Physical Therapy Assessment and Plan  Patient Details  Name: Timothy Case MRN: [id number] Date of Birth: 1955-10-05  PT Diagnosis: Abnormal posture, Abnormality of gait, Cognitive deficits, Coordination disorder, Difficulty walking, Impaired cognition, Low back pain and Muscle weakness Rehab Potential: Good ELOS: ~ 3-3.5weeks   Today's Date: 05/13/2020 PT Individual Time: 0174-9449 and 1440-1503 PT Individual Time Calculation (min): 23 min  And 23 min  Hospital Problem: Principal Problem:   TBI (traumatic brain injury) ([place])   Past Medical History:  Past Medical History:  Diagnosis Date  . Chronic lower back pain    "q am; cause I'm overweight" (04/21/2012)  . Essential familial hyperlipidemia   . GERD (gastroesophageal reflux disease)   . Hyperlipidemia   . Hypertension   . Shortness of breath    "lying down; sometimes" (04/21/2012)  . Stroke ([place]) 04/21/2012   "they are leaning towards mini stroke today; all S/S still gone now except little chest pain" (04/21/2012)   Past Surgical History:  Past Surgical History:  Procedure Laterality Date  . KNEE ARTHROSCOPY  ~ 2008   "both knees; ~ 6 months apart" (04/21/2012)    Assessment & Plan Clinical Impression: Patient is a 65 y.o. year old right-handed male with history of CVA maintained on aspirin, Covid infection in March and then again in December 2021, hypertension, hyperlipidemia, history of alcohol use. Presented 05/04/2020 after being found down by his wife with altered mental status and aphasia. Wife had reported recent headache. Admission chemistries unremarkable except BUN 28 glucose 131 SARS coronavirus positive, urine culture no growth. Cranial CT scan showed bilateral subdural hematomas without midline shift. Diffuse subarachnoid hemorrhage predominantly in the sylvian fissures with sparing of the basilar cisterns. There were bifrontal hemorrhagic contusions. Negative for facial fracture negative cervical  spine. CTA of head and neck no vascular malformation. Follow-up CT scan of the head expected blooming of hemorrhagic contusions in the bilateral inferior frontal lobes and right cerebellum. Traumatic pattern subarachnoid hemorrhage with either increased or diffusion since prior tracing. Slight increase in right frontal parietal subdural hematoma which measures up to 7 mm in thickness. No hydrocephalus. There were findings of nondisplaced right occipital bone fracture. EEG negative for seizure maintained on Keppra for seizure prophylaxis. Neurosurgery follow-up Dr. Vertell Limber as well as neurology services consulted advised conservative care. Patient was cleared to initiate subcutaneous heparin for DVT prophylaxis. Hospital course patient did require intubation for airway protection self extubated 05/06/2020. Patient with bouts of atrial fibrillation RVR placed on intravenous Cardizem and later discontinued and currently maintained on Coreg. Echocardiogram was completed showing ejection fraction of 60 to 67% grade 2 diastolic dysfunction. In regards to patient's Covid positive testing follow-up rapid antigen test was negative it was felt that Covid positive PCR test indicated dead virus shedding rather than active infection and isolation discontinued. He did have bouts of agitation initially requiring Precedex as well as Haldol. His diet was advanced to a regular consistency however he still was receiving nasogastric tube feeds for nutritional support.. Therapy evaluations completed due to patient decreased functional mobility cognitive impairment was admitted for a comprehensive rehab program. Patient transferred to CIR on 05/12/2020 .   Patient currently requires mod assist with mobility secondary to muscle weakness, decreased cardiorespiratoy endurance, impaired timing and sequencing, unbalanced muscle activation, decreased coordination and decreased motor planning,  , decreased initiation, decreased  attention, decreased awareness, decreased problem solving, decreased safety awareness, decreased memory and delayed processing and decreased sitting balance, decreased standing balance, decreased postural control and  decreased balance strategies.  Prior to hospitalization, patient was independent  with mobility and lived with Timothy in a House home.  Home access is 1 small step-up into house.  Patient will benefit from skilled PT intervention to maximize safe functional mobility, minimize fall risk and decrease caregiver burden for planned discharge home with 24 hour supervision.  Anticipate patient will benefit from follow up OP at discharge.  PT - End of Session Activity Tolerance: Tolerates 30+ min activity with multiple rests Endurance Deficit: Yes Endurance Deficit Description: very lethargic requiring increased stimulus to maintian arousal for brief moments PT Assessment Rehab Potential (ACUTE/IP ONLY): Good PT Barriers to Discharge: Home environment access/layout;Behavior;Nutrition means PT Patient demonstrates impairments in the following area(s): Balance;Perception;Behavior;Safety;Sensory;Edema;Endurance;Skin Integrity;Motor;Nutrition;Pain PT Transfers Functional Problem(s): Bed Mobility;Bed to Chair;Car;Furniture;Floor PT Locomotion Functional Problem(s): Ambulation;Stairs PT Plan PT Intensity: Minimum of 1-2 x/day ,45 to 90 minutes PT Frequency: 5 out of 7 days PT Duration Estimated Length of Stay: ~ 3-3.5weeks PT Treatment/Interventions: Ambulation/gait training;Community reintegration;DME/adaptive equipment instruction;Neuromuscular re-education;UE/LE Strength taining/ROM;Stair training;Psychosocial support;Balance/vestibular training;Discharge planning;Functional electrical stimulation;Pain management;Skin care/wound management;Therapeutic Activities;UE/LE Coordination activities;Disease management/prevention;Cognitive remediation/compensation;Patient/family education;Functional  mobility training;Splinting/orthotics;Therapeutic Exercise;Visual/perceptual remediation/compensation PT Transfers Anticipated Outcome(s): supervision PT Locomotion Anticipated Outcome(s): supervision PT Recommendation Follow Up Recommendations: Outpatient PT;24 hour supervision/assistance Patient destination: Home Equipment Recommended: To be determined   PT Evaluation Precautions/Restrictions Precautions Precautions: Fall;Other (comment) Precaution Comments: impaired safety awareness Restrictions Weight Bearing Restrictions: No Pain Pain Assessment Pain Scale: Faces Faces Pain Scale: Hurts little more Pain Type: Chronic pain Pain Location: Back Pain Orientation: Lower Pain Descriptors / Indicators: Aching Pain Onset: On-going Pain Intervention(s): Rest;Repositioned;Relaxation Home Living/Prior Functioning Home Living Available Help at Discharge: Family;Available 24 hours/day (wife) Type of Home: House Home Access: Stairs to enter [employer] of Steps: 1 small step-up into house Home Layout: Full bath on main level;Able to live on main level with bedroom/bathroom;Two level Bathroom Shower/Tub: Walk-in shower;Other (comment) (wife reports grab bars already installed) Bathroom Accessibility: Yes  Lives With: Timothy Case) Prior Function Level of Independence: Independent with gait;Independent with transfers  Able to Take Stairs?: Yes Driving: Yes Leisure: Hobbies-yes (Comment) Comments: working out with trainer, jogging, sports Training and development officer Perception: Impaired Praxis Praxis: Impaired (will continue to assess with increased alertness) Praxis Impairment Details: Initiation;Motor planning  Cognition Overall Cognitive Status: Impaired/Different from baseline Arousal/Alertness: Lethargic Orientation Level: Oriented to person;Disoriented to place;Disoriented to situation;Disoriented to time Attention: Focused;Sustained;Selective Focused  Attention: Impaired Sustained Attention: Impaired Selective Attention: Impaired Memory: Impaired Awareness: Impaired Problem Solving: Impaired Behaviors: Impulsive;Restless Safety/Judgment: Impaired [employer] Scales of Cognitive Functioning: Confused/appropriate Sensation Sensation Light Touch: Impaired by gross assessment (unable to formally assess due to lethargy) Hot/Cold: Not tested Proprioception: Impaired by gross assessment (impaired in B LE as noted during ambulation) Stereognosis: Not tested Coordination Gross Motor Movements are Fluid and Coordinated: No Coordination and Movement Description: impaired B LE coordination noted during ambulation Motor  Motor Motor: Abnormal postural alignment and control;Other (comment) Motor - Skilled Clinical Observations: generalized weakness   Trunk/Postural Assessment  Cervical Assessment Cervical Assessment: Exceptions to [facility] (forward head) Thoracic Assessment Thoracic Assessment: Exceptions to [facility] (thoracic rounding) Lumbar Assessment Lumbar Assessment: Exceptions to [facility] (posterior pelvic tilt in sitting) Postural Control Postural Control: Deficits on evaluation Protective Responses: impaired Postural Limitations: decreased  Balance Balance Balance Assessed: Yes Static Sitting Balance Static Sitting - Balance Support: Feet supported Static Sitting - Level of Assistance: 4: Min assist;3: Mod assist;5: Stand by assistance (increased assist due to lethargy with worsening posterior lean) Dynamic  Sitting Balance Dynamic Sitting - Balance Support: Feet supported Dynamic Sitting - Level of Assistance: 3: Mod assist;4: Min assist Static Standing Balance Static Standing - Balance Support: During functional activity;Left upper extremity supported Static Standing - Level of Assistance: 3: Mod assist Dynamic Standing Balance Dynamic Standing - Balance Support: During functional activity;Left upper extremity supported Dynamic  Standing - Level of Assistance: 3: Mod assist;2: Max assist Extremity Assessment      RLE Assessment Active Range of Motion (AROM) Comments: WFL General Strength Comments: unable to formally assess due to lethargy and inability to follow commands; however, demonstrates at least 3+/5 functionally LLE Assessment LLE Assessment: Exceptions to [facility] Active Range of Motion (AROM) Comments: WFL General Strength Comments: unable to formally assess due to lethargy and inability to follow commands; however, demonstrates at least 3+/5 functionally  Care Tool Care Tool Bed Mobility Roll left and right activity   Roll left and right assist level: Supervision/Verbal cueing    Sit to lying activity   Sit to lying assist level: Contact Guard/Touching assist    Lying to sitting edge of bed activity   Lying to sitting edge of bed assist level: Contact Guard/Touching assist     Care Tool Transfers Sit to stand transfer   Sit to stand assist level: Moderate Assistance - Patient 50 - 74%    Chair/bed transfer   Chair/bed transfer assist level: Moderate Assistance - Patient 50 - 74%     Psychologist, counselling transfer activity did not occur: Safety/medical concerns        Care Tool Locomotion Ambulation   Assist level: Moderate Assistance - Patient 50 - 74% Assistive device: Hand held assist Max distance: 116f  Walk 10 feet activity   Assist level: Moderate Assistance - Patient - 50 - 74% Assistive device: Hand held assist   Walk 50 feet with 2 turns activity   Assist level: Moderate Assistance - Patient - 50 - 74% Assistive device: Hand held assist  Walk 150 feet activity   Assist level: Moderate Assistance - Patient - 50 - 74% Assistive device: Hand held assist  Walk 10 feet on uneven surfaces activity Walk 10 feet on uneven surfaces activity did not occur: Safety/medical concerns      Stairs Stair activity did not occur: Safety/medical concerns        Walk  up/down 1 step activity Walk up/down 1 step or curb (drop down) activity did not occur: Safety/medical concerns     Walk up/down 4 steps activity did not occuR: Safety/medical concerns  Walk up/down 4 steps activity      Walk up/down 12 steps activity Walk up/down 12 steps activity did not occur: Safety/medical concerns      Pick up small objects from floor Pick up small object from the floor (from standing position) activity did not occur: Safety/medical concerns      Wheelchair Will patient use wheelchair at discharge?: No          Wheel 50 feet with 2 turns activity      Wheel 150 feet activity        Refer to Care Plan for Long Term Goals  SHORT TERM GOAL WEEK 1 PT Short Term Goal 1 (Week 1): Pt will perform supine<>sit with supervision PT Short Term Goal 2 (Week 1): Pt will perform sit<>stands using LRAD with min assist PT Short Term Goal 3 (Week 1): Pt will perform bed<>chair transfers using LRAD with min  assist PT Short Term Goal 4 (Week 1): Pt will ambulate at least 146f using LRAD with min assist PT Short Term Goal 5 (Week 1): Pt will ascend/descend 4 steps using B HRs with min assist  Recommendations for other services: None  - will continue to assess for appropriate referrals  Skilled Therapeutic Intervention Evaluation completed (see details above) with patient education regarding purpose of PT evaluation, PT POC and goals, therapy schedule, weekly team meetings, and other CIR information including safety plan and fall risk safety.  Session 1: Pt received in w/c being transported out of bathroom by RN with eyes close and appearing very lethargic. Pt falling asleep sitting up in w/c and unable to maintain arousal therefore encouraged transfer back to bed. R stand pivot w/c>EOB, no AD, with min assist for balance. Sit>L sidelying with CGA for trunk descent safety. Pt quick to fall asleep in sidelying and pt's wife reporting he didn't sleep well last night and has been  lethargic all day today. Therapist decided to return at a later time after pt has time to rest. Left L sidelying in bed with needs in reach, bed alarm on, pt's wife present, and RN aware.  Session 2: Returned to pt's room and he was lying supine in bed in with his wife present and pt opening his eyes when hearing therapist. Pt agreeable to session at this time. Supine>sitting L EOB with CGA for steadying/safety. Sit>stand EOB>L HHA with min assist for balance. Gait training ~1582fusing L HHA with mod assist for balance due to R/L lateral sway and path deviation due to impaired balance. Pt able to maintain alertness during this task although becoming drowsy towards end with decreased safety awareness of needing to walk towards a place to rest requiring cuing to redirect back towards room - pt's wife provided w/c follow for safety during gait. Pt with decreased and delayed verbal response to questions throughout session - disoriented to location and situation stating he is at home - attempted to reorient pt but at this time becoming more lethargic requiring increased stimulus to maintain alertness for brief moments. Sitting EOB with CGA progressing towards mod assist due to increasing lethargy resulting in posterior lean while engaging pt in eating fruit cup and banana and drinking Ensure due to decreased intake today. Pt becoming more lethargic therefore discontinued food and encouraged to return to supine for safety. Pt left supine in bed with HOB elevated, needs in reach, bed alarm on, and his wife present.  Mobility Bed Mobility Bed Mobility: Supine to Sit;Sit to Supine Supine to Sit: Contact Guard/Touching assist Sit to Supine: Contact Guard/Touching assist Transfers Transfers: Sit to Stand;Stand to Sit;Stand Pivot Transfers Sit to Stand: Moderate Assistance - Patient 50-74% Stand to Sit: Moderate Assistance - Patient 50-74% Stand Pivot Transfers: Moderate Assistance - Patient 50 - 74% Stand Pivot  Transfer Details: Tactile cues for sequencing;Tactile cues for posture;Tactile cues for weight shifting;Verbal cues for technique;Verbal cues for gait pattern;Verbal cues for precautions/safety;Manual facilitation for weight shifting;Visual cues/gestures for precautions/safety Transfer (Assistive device): 1 person hand held assist Locomotion  Gait Ambulation: Yes Gait Assistance: Moderate Assistance - Patient 50-74% Gait Distance (Feet): 150 Feet Assistive device: 1 person hand held assist Gait Assistance Details: Manual facilitation for weight shifting;Tactile cues for weight shifting;Tactile cues for sequencing;Tactile cues for posture;Verbal cues for technique;Verbal cues for gait pattern;Verbal cues for sequencing;Verbal cues for precautions/safety Gait Gait: Yes Gait Pattern: Impaired Gait Pattern: Step-through pattern;Poor foot clearance - left;Poor foot clearance - right;Decreased  hip/knee flexion - right;Decreased hip/knee flexion - left Gait velocity: decreased Stairs / Additional Locomotion Stairs: No Wheelchair Mobility Wheelchair Mobility: No   Discharge Criteria: Patient will be discharged from PT if patient refuses treatment 3 consecutive times without medical reason, if treatment goals not met, if there is a change in medical status, if patient makes no progress towards goals or if patient is discharged from hospital.  The above assessment, treatment plan, treatment alternatives and goals were discussed and mutually agreed upon: by patient and by family  [name] , PT, DPT, CSRS  05/13/2020, 12:20 PM

## 2020-05-13 NOTE — Plan of Care (Signed)
  Problem: Consults Goal: RH BRAIN INJURY PATIENT EDUCATION Description: Description: See Patient Education module for eduction specifics Outcome: Progressing Goal: Skin Care Protocol Initiated - if Braden Score 18 or less Description: If consults are not indicated, leave blank or document N/A Outcome: Progressing Goal: Nutrition Consult-if indicated Outcome: Progressing   Problem: RH BOWEL ELIMINATION Goal: RH STG MANAGE BOWEL WITH ASSISTANCE Description: STG Manage Bowel with mod I Assistance. Outcome: Progressing Goal: RH STG MANAGE BOWEL W/MEDICATION W/ASSISTANCE Description: STG Manage Bowel with Medication with mod I Assistance. Outcome: Progressing   Problem: RH BLADDER ELIMINATION Goal: RH STG MANAGE BLADDER WITH ASSISTANCE Description: STG Manage Bladder With mod I Assistance Outcome: Progressing   Problem: RH SKIN INTEGRITY Goal: RH STG SKIN FREE OF INFECTION/BREAKDOWN Outcome: Progressing Goal: RH STG MAINTAIN SKIN INTEGRITY WITH ASSISTANCE Description: STG Maintain Skin Integrity With mod I Assistance. Outcome: Progressing   Problem: RH SAFETY Goal: RH STG ADHERE TO SAFETY PRECAUTIONS W/ASSISTANCE/DEVICE Description: STG Adhere to Safety Precautions With Assistance/Device. Outcome: Progressing   Problem: RH COGNITION-NURSING Goal: RH STG USES MEMORY AIDS/STRATEGIES W/ASSIST TO PROBLEM SOLVE Description: STG Uses Memory Aids/Strategies With Assistance to Problem Solve. Outcome: Progressing Goal: RH STG ANTICIPATES NEEDS/CALLS FOR ASSIST W/ASSIST/CUES Description: STG Anticipates Needs/Calls for Assist With Assistance/Cues. Outcome: Progressing   Problem: RH PAIN MANAGEMENT Goal: RH STG PAIN MANAGED AT OR BELOW PT'S PAIN GOAL Description: Pain scale <4/10 Outcome: Progressing   Problem: RH KNOWLEDGE DEFICIT BRAIN INJURY Goal: RH STG INCREASE KNOWLEDGE OF SELF CARE AFTER BRAIN INJURY Outcome: Progressing Goal: RH STG INCREASE KNOWLEDGE OF DYSPHAGIA/FLUID  INTAKE Outcome: Progressing   Problem: Safety: Goal: Non-violent Restraint(s) Outcome: Progressing   Problem: Education: Goal: Knowledge of General Education information will improve Description: Including pain rating scale, medication(s)/side effects and non-pharmacologic comfort measures Outcome: Progressing   Problem: Health Behavior/Discharge Planning: Goal: Ability to manage health-related needs will improve Outcome: Progressing   Problem: Clinical Measurements: Goal: Ability to maintain clinical measurements within normal limits will improve Outcome: Progressing Goal: Will remain free from infection Outcome: Progressing Goal: Diagnostic test results will improve Outcome: Progressing Goal: Respiratory complications will improve Outcome: Progressing Goal: Cardiovascular complication will be avoided Outcome: Progressing   Problem: Activity: Goal: Risk for activity intolerance will decrease Outcome: Progressing   Problem: Nutrition: Goal: Adequate nutrition will be maintained Outcome: Progressing   Problem: Coping: Goal: Level of anxiety will decrease Outcome: Progressing   Problem: Pain Managment: Goal: General experience of comfort will improve Outcome: Progressing   Problem: Safety: Goal: Ability to remain free from injury will improve Outcome: Progressing   Problem: Skin Integrity: Goal: Risk for impaired skin integrity will decrease Outcome: Progressing

## 2020-05-13 NOTE — Plan of Care (Signed)
  Problem: Consults Goal: RH BRAIN INJURY PATIENT EDUCATION Description: Description: See Patient Education module for eduction specifics Outcome: Progressing Goal: Skin Care Protocol Initiated - if Braden Score 18 or less Description: If consults are not indicated, leave blank or document N/A Outcome: Progressing Goal: Nutrition Consult-if indicated Outcome: Progressing   Problem: RH BOWEL ELIMINATION Goal: RH STG MANAGE BOWEL WITH ASSISTANCE Description: STG Manage Bowel with mod I Assistance. Outcome: Progressing Goal: RH STG MANAGE BOWEL W/MEDICATION W/ASSISTANCE Description: STG Manage Bowel with Medication with mod I Assistance. Outcome: Progressing   Problem: RH BLADDER ELIMINATION Goal: RH STG MANAGE BLADDER WITH ASSISTANCE Description: STG Manage Bladder With mod I Assistance Outcome: Progressing   Problem: RH SKIN INTEGRITY Goal: RH STG SKIN FREE OF INFECTION/BREAKDOWN Outcome: Progressing Goal: RH STG MAINTAIN SKIN INTEGRITY WITH ASSISTANCE Description: STG Maintain Skin Integrity With mod I Assistance. Outcome: Progressing   Problem: RH SAFETY Goal: RH STG ADHERE TO SAFETY PRECAUTIONS W/ASSISTANCE/DEVICE Description: STG Adhere to Safety Precautions With min Assistance/Device. Outcome: Progressing   Problem: RH COGNITION-NURSING Goal: RH STG USES MEMORY AIDS/STRATEGIES W/ASSIST TO PROBLEM SOLVE Description: STG Uses Memory Aids/Strategies With min Assistance to Problem Solve. Outcome: Progressing Goal: RH STG ANTICIPATES NEEDS/CALLS FOR ASSIST W/ASSIST/CUES Description: STG Anticipates Needs/Calls for Assist With min Assistance/Cues. Outcome: Progressing   Problem: RH PAIN MANAGEMENT Goal: RH STG PAIN MANAGED AT OR BELOW PT'S PAIN GOAL Description: Pain scale <4/10 Outcome: Progressing   Problem: RH KNOWLEDGE DEFICIT BRAIN INJURY Goal: RH STG INCREASE KNOWLEDGE OF SELF CARE AFTER BRAIN INJURY Outcome: Progressing Goal: RH STG INCREASE KNOWLEDGE OF  DYSPHAGIA/FLUID INTAKE Outcome: Progressing   Problem: Safety: Goal: Non-violent Restraint(s) Outcome: Progressing   Problem: Education: Goal: Knowledge of General Education information will improve Description: Including pain rating scale, medication(s)/side effects and non-pharmacologic comfort measures Outcome: Progressing   Problem: Health Behavior/Discharge Planning: Goal: Ability to manage health-related needs will improve Outcome: Progressing   Problem: Clinical Measurements: Goal: Ability to maintain clinical measurements within normal limits will improve Outcome: Progressing Goal: Will remain free from infection Outcome: Progressing Goal: Diagnostic test results will improve Outcome: Progressing Goal: Respiratory complications will improve Outcome: Progressing Goal: Cardiovascular complication will be avoided Outcome: Progressing   Problem: Activity: Goal: Risk for activity intolerance will decrease Outcome: Progressing   Problem: Nutrition: Goal: Adequate nutrition will be maintained Outcome: Progressing   Problem: Coping: Goal: Level of anxiety will decrease Outcome: Progressing   Problem: Pain Managment: Goal: General experience of comfort will improve Outcome: Progressing   Problem: Safety: Goal: Ability to remain free from injury will improve Outcome: Progressing   Problem: Skin Integrity: Goal: Risk for impaired skin integrity will decrease Outcome: Progressing   

## 2020-05-14 DIAGNOSIS — S069X0S Unspecified intracranial injury without loss of consciousness, sequela: Secondary | ICD-10-CM | POA: Diagnosis not present

## 2020-05-14 MED ORDER — METHYLPHENIDATE HCL 5 MG PO TABS
5.0000 mg | ORAL_TABLET | Freq: Every morning | ORAL | Status: DC
  Administered 2020-05-15 – 2020-05-16 (×2): 5 mg via ORAL
  Filled 2020-05-14 (×2): qty 1

## 2020-05-14 MED ORDER — QUETIAPINE FUMARATE 25 MG PO TABS
25.0000 mg | ORAL_TABLET | Freq: Every day | ORAL | Status: DC
  Administered 2020-05-14: 25 mg via ORAL
  Filled 2020-05-14: qty 1

## 2020-05-14 NOTE — Progress Notes (Signed)
[place] PHYSICAL MEDICINE & REHABILITATION PROGRESS NOTE   Subjective/Complaints:  Now in enclosure bed but still with poor sleep  ~1hr last noc, tired today , son at bedside  Pt more conversant , short sentence appropriate to situation  Review of systems difficult to obtain secondary to his level of alertness  Objective:   No results found. Recent Labs    05/12/20 0601 05/12/20 1818  WBC 11.5* 11.0*  HGB 14.0 13.9  HCT 42.5 42.5  PLT 224 242   Recent Labs    05/12/20 0601 05/12/20 1623  NA 136  --   K 3.8  --   CL 100  --   CO2 25  --   GLUCOSE 128*  --   BUN 27*  --   CREATININE 0.91 0.92  CALCIUM 8.6*  --     Intake/Output Summary (Last 24 hours) at 05/14/2020 1146 Last data filed at 05/14/2020 1116 Gross per 24 hour  Intake 530 ml  Output 825 ml  Net -295 ml        Physical Exam: Vital Signs Blood pressure (!) 147/90, pulse 68, temperature 97.9 F (36.6 C), resp. rate 20, height 5' 9.02" (1.753 m), weight 103.4 kg, SpO2 97 %.   General: No acute distress Mood and affect are appropriate Heart: Regular rate and rhythm no rubs murmurs or extra sounds Lungs: Clear to auscultation, breathing unlabored, no rales or wheezes Abdomen: Positive bowel sounds, soft nontender to palpation, nondistended Extremities: No clubbing, cyanosis, or edema Skin: No evidence of breakdown, no evidence of rash   Neurologic difficult to assess manual muscle testing appears to be around 4/5 bilateral upper extremities lower limbs he did not cooperate well  sensory exam unable to cooperate secondary to mental status Cerebellar exam unable to cooperate Musculoskeletal: Full passive range of motion in all 4 extremities. No joint swelling   Assessment/Plan: 1. Functional deficits which require 3+ hours per day of interdisciplinary therapy in a comprehensive inpatient rehab setting.  Physiatrist is providing close team supervision and 24 hour management of active medical  problems listed below.  Physiatrist and rehab team continue to assess barriers to discharge/monitor patient progress toward functional and medical goals  Care Tool:  Bathing    Body parts bathed by patient: Right arm,Left arm,Chest,Abdomen,Right upper leg,Left upper leg,Face   Body parts bathed by helper: Front perineal area,Buttocks,Right lower leg,Left lower leg     Bathing assist Assist Level: Moderate Assistance - Patient 50 - 74%     Upper Body Dressing/Undressing Upper body dressing   What is the patient wearing?: Hospital gown only    Upper body assist Assist Level: Independent    Lower Body Dressing/Undressing Lower body dressing      What is the patient wearing?: Incontinence brief     Lower body assist Assist for lower body dressing: Moderate Assistance - Patient 50 - 74%     Toileting Toileting    Toileting assist Assist for toileting: Moderate Assistance - Patient 50 - 74% Assistive Device Comment: Urinal   Transfers Chair/bed transfer  Transfers assist     Chair/bed transfer assist level: Moderate Assistance - Patient 50 - 74%     Locomotion Ambulation   Ambulation assist      Assist level: Moderate Assistance - Patient 50 - 74% Assistive device: Hand held assist Max distance: 119ft   Walk 10 feet activity   Assist     Assist level: Moderate Assistance - Patient - 50 - 74% Assistive device:  Hand held assist   Walk 50 feet activity   Assist    Assist level: Moderate Assistance - Patient - 50 - 74% Assistive device: Hand held assist    Walk 150 feet activity   Assist    Assist level: Moderate Assistance - Patient - 50 - 74% Assistive device: Hand held assist    Walk 10 feet on uneven surface  activity   Assist Walk 10 feet on uneven surfaces activity did not occur: Safety/medical concerns         Wheelchair     Assist Will patient use wheelchair at discharge?: No             Wheelchair 50 feet with  2 turns activity    Assist            Wheelchair 150 feet activity     Assist          Blood pressure (!) 147/90, pulse 68, temperature 97.9 F (36.6 C), resp. rate 20, height 5' 9.02" (1.753 m), weight 103.4 kg, SpO2 97 %.    Medical Problem List and Plan: 1.TBI/SDH/nondisplaced right occipital bone fracturesecondary tounwitnessed fall 05/04/2020 -ELOS/Goals: 18-21 days, supervision to min assist goals with PT, OT, SLP -will place in enclosure bed for safety when one is available.  2. Antithrombotics: -DVT/anticoagulation:Subcutaneous heparin 05/08/2020 -antiplatelet therapy: N/A 3. Pain Management:Oxycodone as needed 4. Mood/sleep:Melatonin 5 mg nightly as needed. start low-dose nightly Seroquel 25mg  qhs 1/30 Trial ritalin 5mg  qam  -check sleep chart -antipsychotic agents: N/A 5. Neuropsych: This patientis notcapable of making decisions on hisown behalf. 6. Skin/Wound Care:Routine skin checks 7. Fluids/Electrolytes/Nutrition: -encourage PO intake -check labs monday 8. Seizure prophylaxis. Keppra 1000 mg mg twice daily 9. Atrial fibrillation with RVR. Intravenous Cardizem discontinued. Continue Coreg 25 mg twice daily -HR&R controlled and regular at present 10. Hypertension. Norvasc 10 mg daily, Coreg 25 mg twice daily. Monitor with increased mobility Vitals:   05/13/20 1926 05/14/20 0320  BP: 138/80 (!) 147/90  Pulse: 67 68  Resp: 20 20  Temp: 99.5 F (37.5 C) 97.9 F (36.6 C)  SpO2: 97% 97%  Heart rate well controlled, pressure still up we will add HCTZ 1/29- improving 1/30 11. Decreased nutritional storage/Dysphagia: -Diet has been advanced to regular.  -Currently on nightly tube feeds---will dc today as he's eating fairly well and NGT is serving as a big irritant for him.  -team/family  will need to push PO 12. History of alcohol use. Monitor for any signs of withdrawal 13. Constipation. MiraLAX as directed. Adjust bowel program as needed. 14. History recent Covid March and then again December 2021. Rapid antigen test was negative. It was felt that Covid positive PCR test indicated dead virus shedding rather than active infection and isolation discontinued. #15 insomnia add sleep graph and trazodone LOS: 2 days A FACE TO FACE EVALUATION WAS PERFORMED  April 05/14/2020, 11:46 AM

## 2020-05-14 NOTE — Plan of Care (Signed)
  Problem: Consults Goal: RH BRAIN INJURY PATIENT EDUCATION Description: Description: See Patient Education module for eduction specifics Outcome: Progressing Goal: Skin Care Protocol Initiated - if Braden Score 18 or less Description: If consults are not indicated, leave blank or document N/A Outcome: Progressing Goal: Nutrition Consult-if indicated Outcome: Progressing   Problem: RH BOWEL ELIMINATION Goal: RH STG MANAGE BOWEL WITH ASSISTANCE Description: STG Manage Bowel with mod I Assistance. Outcome: Progressing Goal: RH STG MANAGE BOWEL W/MEDICATION W/ASSISTANCE Description: STG Manage Bowel with Medication with mod I Assistance. Outcome: Progressing   Problem: RH BLADDER ELIMINATION Goal: RH STG MANAGE BLADDER WITH ASSISTANCE Description: STG Manage Bladder With mod I Assistance Outcome: Progressing   Problem: RH SKIN INTEGRITY Goal: RH STG SKIN FREE OF INFECTION/BREAKDOWN Outcome: Progressing Goal: RH STG MAINTAIN SKIN INTEGRITY WITH ASSISTANCE Description: STG Maintain Skin Integrity With mod I Assistance. Outcome: Progressing   Problem: RH SAFETY Goal: RH STG ADHERE TO SAFETY PRECAUTIONS W/ASSISTANCE/DEVICE Description: STG Adhere to Safety Precautions With min Assistance/Device. Outcome: Progressing   Problem: RH COGNITION-NURSING Goal: RH STG USES MEMORY AIDS/STRATEGIES W/ASSIST TO PROBLEM SOLVE Description: STG Uses Memory Aids/Strategies With min Assistance to Problem Solve. Outcome: Progressing Goal: RH STG ANTICIPATES NEEDS/CALLS FOR ASSIST W/ASSIST/CUES Description: STG Anticipates Needs/Calls for Assist With min Assistance/Cues. Outcome: Progressing   Problem: RH PAIN MANAGEMENT Goal: RH STG PAIN MANAGED AT OR BELOW PT'S PAIN GOAL Description: Pain scale <4/10 Outcome: Progressing   Problem: RH KNOWLEDGE DEFICIT BRAIN INJURY Goal: RH STG INCREASE KNOWLEDGE OF SELF CARE AFTER BRAIN INJURY Outcome: Progressing Goal: RH STG INCREASE KNOWLEDGE OF  DYSPHAGIA/FLUID INTAKE Outcome: Progressing   Problem: Safety: Goal: Non-violent Restraint(s) Outcome: Progressing   Problem: Education: Goal: Knowledge of General Education information will improve Description: Including pain rating scale, medication(s)/side effects and non-pharmacologic comfort measures Outcome: Progressing   Problem: Health Behavior/Discharge Planning: Goal: Ability to manage health-related needs will improve Outcome: Progressing   Problem: Clinical Measurements: Goal: Ability to maintain clinical measurements within normal limits will improve Outcome: Progressing Goal: Will remain free from infection Outcome: Progressing Goal: Diagnostic test results will improve Outcome: Progressing Goal: Respiratory complications will improve Outcome: Progressing Goal: Cardiovascular complication will be avoided Outcome: Progressing   Problem: Activity: Goal: Risk for activity intolerance will decrease Outcome: Progressing   Problem: Nutrition: Goal: Adequate nutrition will be maintained Outcome: Progressing   Problem: Coping: Goal: Level of anxiety will decrease Outcome: Progressing   Problem: Pain Managment: Goal: General experience of comfort will improve Outcome: Progressing   Problem: Safety: Goal: Ability to remain free from injury will improve Outcome: Progressing   Problem: Skin Integrity: Goal: Risk for impaired skin integrity will decrease Outcome: Progressing

## 2020-05-14 NOTE — Plan of Care (Signed)
  Problem: RH BOWEL ELIMINATION Goal: RH STG MANAGE BOWEL WITH ASSISTANCE Description: STG Manage Bowel with mod I Assistance. Outcome: Not Progressing Goal: RH STG MANAGE BOWEL W/MEDICATION W/ASSISTANCE Description: STG Manage Bowel with Medication with mod I Assistance. Outcome: Not Progressing   Problem: RH SAFETY Goal: RH STG ADHERE TO SAFETY PRECAUTIONS W/ASSISTANCE/DEVICE Description: STG Adhere to Safety Precautions With min Assistance/Device. Outcome: Not Progressing   Problem: RH COGNITION-NURSING Goal: RH STG USES MEMORY AIDS/STRATEGIES W/ASSIST TO PROBLEM SOLVE Description: STG Uses Memory Aids/Strategies With min Assistance to Problem Solve. Outcome: Not Progressing Goal: RH STG ANTICIPATES NEEDS/CALLS FOR ASSIST W/ASSIST/CUES Description: STG Anticipates Needs/Calls for Assist With min Assistance/Cues. Outcome: Not Progressing

## 2020-05-14 NOTE — Progress Notes (Signed)
Patient drowsy majority of shift. Patient asleep during rounds but awakens easily- able to take medications. Patient's son at bedside today. Attempted to have patient up in chair- allowed interaction, diversional activities to keep patient awake and engaged. Patient able to tolerate for short time before becoming agitated and requesting to lie back down.

## 2020-05-14 NOTE — Evaluation (Signed)
Speech Language Pathology Assessment and Plan  Patient Details  Name: Timothy Case MRN: [id number] Date of Birth: [birth year]  SLP Diagnosis: Cognitive Impairments;Dysphagia  Rehab Potential: Good ELOS: 21-25 days    Today's Date: 05/14/2020 SLP Individual Time: 0932-6712 SLP Individual Time Calculation (min): 34 min   Hospital Problem: Principal Problem:   TBI (traumatic brain injury) [facility])  Past Medical History:  Past Medical History:  Diagnosis Date  . Chronic lower back pain    "q am; cause I'm overweight" (04/21/2012)  . Essential familial hyperlipidemia   . GERD (gastroesophageal reflux disease)   . Hyperlipidemia   . Hypertension   . Shortness of breath    "lying down; sometimes" (04/21/2012)  . Stroke ([place]) 04/21/2012   "they are leaning towards mini stroke today; all S/S still gone now except little chest pain" (04/21/2012)   Past Surgical History:  Past Surgical History:  Procedure Laterality Date  . KNEE ARTHROSCOPY  ~ 2008   "both knees; ~ 6 months apart" (04/21/2012)    Assessment / Plan / Recommendation Clinical Impression   Jairen Goldfarb a 65 year old right-handed male with history of CVA maintained on aspirin, Covid infection in March and then again in December 2021, hypertension, hyperlipidemia, history of alcohol use. Presented 05/04/2020 after being found down by his wife with altered mental status and aphasia. Wife had reported recent headache. Admission chemistries unremarkable except BUN 28 glucose 131 SARS coronavirus positive, urine culture no growth. Cranial CT scan showed bilateral subdural hematomas without midline shift. Diffuse subarachnoid hemorrhage predominantly in the sylvian fissures with sparing of the basilar cisterns. There were bifrontal hemorrhagic contusions. Negative for facial fracture negative cervical spine. CTA of head and neck no vascular malformation. Follow-up CT scan of the head expected blooming of  hemorrhagic contusions in the bilateral inferior frontal lobes and right cerebellum. Traumatic pattern subarachnoid hemorrhage with either increased or diffusion since prior tracing. Slight increase in right frontal parietal subdural hematoma which measures up to 7 mm in thickness. No hydrocephalus. There were findings of nondisplaced right occipital bone fracture. EEG negative for seizure maintained on Keppra for seizure prophylaxis. Neurosurgery follow-up Dr. Vertell Limber as well as neurology services consulted advised conservative care. Patient was cleared to initiate subcutaneous heparin for DVT prophylaxis. Hospital course patient did require intubation for airway protection self extubated 05/06/2020. Patient with bouts of atrial fibrillation RVR placed on intravenous Cardizem and later discontinued and currently maintained on Coreg. Echocardiogram was completed showing ejection fraction of 60 to 45% grade 2 diastolic dysfunction. In regards to patient's Covid positive testing follow-up rapid antigen test was negative it was felt that Covid positive PCR test indicated dead virus shedding rather than active infection and isolation discontinued. He did have bouts of agitation initially requiring Precedex as well as Haldol. His diet was advanced to a regular consistency however he still was receiving nasogastric tube feeds for nutritional support.. Therapy evaluations completed due to patient decreased functional mobility cognitive impairment was admitted for a comprehensive rehab program.  SLP evaluation was completed on 05/14/2020 with results as follows:   Pt presents with a cognitively based dysphagia with no overt s/s of aspiration noted with solids or liquids.  Pt had difficulty sustaining his attention to POs and is lethargic which places him at an increased risk of aspiration; however, I feel that pt remains appropriate for a diet of regular textures and thin liquids with full supervision for use of  universal swallowing precautions.    Cognitively, pt presents with behaviors  consistent with a RL VI.  He has decreased focused attention to tasks, slowed processing, disoriented to everything but self, decreased functional problem solving, and decreased storage of information.  As a result, pt is slow to express himself to staff members and only communicates in short phrases in <50% of opportunities.  He also has difficulty following 1 step commands.    Given the abovementioned deficits, pt would benefit from skilled ST while inpatient in order to maximize functional independence and reduce burden of care prior to discharge.  Anticipate that pt will need 24/7 supervision at discharge in addition to [place] follow up at next level of care.     Skilled Therapeutic Interventions          Cognitive-linguistic and bedside swallow evaluation completed with results and recommendations reviewed with patient.     SLP Assessment  Patient will need skilled Speech Lanaguage Pathology Services during CIR admission    Recommendations  SLP Diet Recommendations: Age appropriate regular solids;Thin Liquid Administration via: Cup;Straw Medication Administration: Whole meds with liquid Supervision: Patient able to self feed;Full supervision/cueing for compensatory strategies Compensations: Slow rate;Small sips/bites;Minimize environmental distractions Postural Changes and/or Swallow Maneuvers: Seated upright 90 degrees Oral Care Recommendations: Oral care QID Recommendations for Other Services: Neuropsych consult Patient destination: Home Follow up Recommendations: Home Health SLP;24 hour supervision/assistance Equipment Recommended: None recommended by SLP    SLP Frequency 3 to 5 out of 7 days   SLP Duration  SLP Intensity  SLP Treatment/Interventions 21-25 days  Minumum of 1-2 x/day, 30 to 90 minutes  Cognitive remediation/compensation;Cueing hierarchy;Functional tasks;Patient/family  education;Environmental controls;Dysphagia/aspiration precaution training;Internal/external aids    Pain Pain Assessment Pain Scale: 0-10 Pain Score: 7  Pain Type: Acute pain Pain Location: Back Pain Descriptors / Indicators: Aching Pain Onset: Gradual Pain Intervention(s): Medication (See eMAR)  Prior Functioning Cognitive/Linguistic Baseline: Within functional limits Type of Home: House  Lives With: Spouse Available Help at Discharge: Family;Available 24 hours/day  SLP Evaluation Cognition Overall Cognitive Status: Impaired/Different from baseline Arousal/Alertness: Lethargic Orientation Level: Oriented to person Attention: Focused Focused Attention: Impaired Focused Attention Impairment: Verbal basic Memory: Impaired Memory Impairment: Storage deficit Awareness: Impaired Awareness Impairment: Intellectual impairment Problem Solving: Impaired Problem Solving Impairment: Verbal basic;Functional basic Safety/Judgment: Impaired [employer] Scales of Cognitive Functioning: Confused/appropriate  Comprehension Auditory Comprehension Overall Auditory Comprehension: Other (comment) (impacted by cognition) Yes/No Questions: Impaired Basic Biographical Questions: 51-75% accurate Basic Immediate Environment Questions: 50-74% accurate Commands: Impaired One Step Basic Commands: 25-49% accurate Interfering Components: Processing speed Expression Expression Primary Mode of Expression: Verbal Verbal Expression Overall Verbal Expression: Other (comment) (impacted by cognition) Level of Generative/Spontaneous Verbalization: Phrase Other Verbal Expression Comments: very slow to respond Oral Motor Oral Motor/Sensory Function Overall Oral Motor/Sensory Function: Other (comment) (difficult to assess due to pt's difficulty following commands) Motor Speech Overall Motor Speech: Appears within functional limits for tasks assessed  Care Tool Care Tool Cognition Expression of  Ideas and Wants Expression of Ideas and Wants: Frequent difficulty - frequently exhibits difficulty with expressing needs and ideas   Understanding Verbal and Non-Verbal Content Understanding Verbal and Non-Verbal Content: Sometimes understands - understands only basic conversations or simple, direct phrases. Frequently requires cues to understand   Memory/Recall Ability *first 3 days only Memory/Recall Ability *first 3 days only: None of the above were recalled     PMSV Assessment  PMSV Trial    Bedside Swallowing Assessment General Date of Onset: 05/08/20 Previous Swallow Assessment: 05/10/2020 Diet Prior to this Study: Regular;Thin liquids Temperature  Spikes Noted: No Respiratory Status: Room air History of Recent Intubation: Yes Length of Intubations (days): 2 days Date extubated: 05/06/20 Behavior/Cognition: Cooperative;Lethargic/Drowsy;Confused Oral Cavity - Dentition: Adequate natural dentition Self-Feeding Abilities: Able to feed self;Needs assist Vision: Functional for self-feeding Patient Positioning: Upright in bed Baseline Vocal Quality: Normal  Oral Care Assessment   Ice Chips   Thin Liquid Presentation: Straw Nectar Thick   Honey Thick   Puree Puree: Within functional limits Solid Solid: Within functional limits BSE Assessment Risk for Aspiration Impact on safety and function: Mild aspiration risk Other Related Risk Factors: Prolonged intubation;Cognitive impairment;Lethargy  Short Term Goals: Week 1: SLP Short Term Goal 1 (Week 1): Pt will utilize external aids to reorient to place, date, and situation with mod assist multimodal cues. SLP Short Term Goal 2 (Week 1): Pt will complete basic, familiar tasks wtih mod assist multimodal cues. SLP Short Term Goal 3 (Week 1): Pt will return demonstration of at least 2 safety precautions during functional tasks with mod assist multimodal cues. SLP Short Term Goal 4 (Week 1): Pt will sustain his attention to  basic, familiar tasks for ~2 minute intervals with mod cues for redirection. SLP Short Term Goal 5 (Week 1): Pt will consume regular textures and thin liquids with min cues for use of universal precautions and minimal overt s/s of aspiration.  Refer to Care Plan for Long Term Goals  Recommendations for other services: Neuropsych  Discharge Criteria: Patient will be discharged from SLP if patient refuses treatment 3 consecutive times without medical reason, if treatment goals not met, if there is a change in medical status, if patient makes no progress towards goals or if patient is discharged from hospital.  The above assessment, treatment plan, treatment alternatives and goals were discussed and mutually agreed upon: No family available/patient unable  Emilio Math 05/14/2020, 12:41 PM

## 2020-05-15 DIAGNOSIS — I10 Essential (primary) hypertension: Secondary | ICD-10-CM | POA: Diagnosis not present

## 2020-05-15 DIAGNOSIS — E871 Hypo-osmolality and hyponatremia: Secondary | ICD-10-CM

## 2020-05-15 DIAGNOSIS — R4689 Other symptoms and signs involving appearance and behavior: Secondary | ICD-10-CM

## 2020-05-15 DIAGNOSIS — S069X0S Unspecified intracranial injury without loss of consciousness, sequela: Secondary | ICD-10-CM

## 2020-05-15 DIAGNOSIS — S069X2S Unspecified intracranial injury with loss of consciousness of 31 minutes to 59 minutes, sequela: Secondary | ICD-10-CM | POA: Diagnosis not present

## 2020-05-15 LAB — COMPREHENSIVE METABOLIC PANEL
ALT: 85 U/L — ABNORMAL HIGH (ref 0–44)
AST: 34 U/L (ref 15–41)
Albumin: 3.3 g/dL — ABNORMAL LOW (ref 3.5–5.0)
Alkaline Phosphatase: 96 U/L (ref 38–126)
Anion gap: 11 (ref 5–15)
BUN: 27 mg/dL — ABNORMAL HIGH (ref 8–23)
CO2: 30 mmol/L (ref 22–32)
Calcium: 9.2 mg/dL (ref 8.9–10.3)
Chloride: 90 mmol/L — ABNORMAL LOW (ref 98–111)
Creatinine, Ser: 1.14 mg/dL (ref 0.61–1.24)
GFR, Estimated: >60 mL/min (ref >60)
Glucose, Bld: 113 mg/dL — ABNORMAL HIGH (ref 70–99)
Potassium: 4.2 mmol/L (ref 3.5–5.1)
Sodium: 131 mmol/L — ABNORMAL LOW (ref 135–145)
Total Bilirubin: 0.9 mg/dL (ref 0.3–1.2)
Total Protein: 7.4 g/dL (ref 6.5–8.1)

## 2020-05-15 LAB — CBC WITH DIFFERENTIAL/PLATELET
Abs Immature Granulocytes: 0.04 10*3/uL (ref 0.00–0.07)
Basophils Absolute: 0 10*3/uL (ref 0.0–0.1)
Basophils Relative: 0 %
Eosinophils Absolute: 0.1 10*3/uL (ref 0.0–0.5)
Eosinophils Relative: 1 %
HCT: 44.6 % (ref 39.0–52.0)
Hemoglobin: 15.5 g/dL (ref 13.0–17.0)
Immature Granulocytes: 0 %
Lymphocytes Relative: 16 %
Lymphs Abs: 1.7 10*3/uL (ref 0.7–4.0)
MCH: 31.8 pg (ref 26.0–34.0)
MCHC: 34.8 g/dL (ref 30.0–36.0)
MCV: 91.4 fL (ref 80.0–100.0)
Monocytes Absolute: 1.4 10*3/uL — ABNORMAL HIGH (ref 0.1–1.0)
Monocytes Relative: 13 %
Neutro Abs: 7.5 10*3/uL (ref 1.7–7.7)
Neutrophils Relative %: 70 %
Platelets: 388 10*3/uL (ref 150–400)
RBC: 4.88 MIL/uL (ref 4.22–5.81)
RDW: 12.7 % (ref 11.5–15.5)
WBC: 10.8 10*3/uL — ABNORMAL HIGH (ref 4.0–10.5)
nRBC: 0 % (ref 0.0–0.2)

## 2020-05-15 MED ORDER — QUETIAPINE FUMARATE 50 MG PO TABS
50.0000 mg | ORAL_TABLET | Freq: Every day | ORAL | Status: DC
  Administered 2020-05-15: 50 mg via ORAL
  Filled 2020-05-15: qty 1

## 2020-05-15 NOTE — Progress Notes (Signed)
[place] PHYSICAL MEDICINE & REHABILITATION PROGRESS NOTE   Subjective/Complaints:  Pt up a lot of night. A lot somatic complaints including H/A. Slept 4 hours per RN. Was resting and slow to awaken when I arrived this morning  ROS: Limited due to cognitive/behavioral    Objective:   No results found. Recent Labs    05/12/20 1818 05/15/20 0608  WBC 11.0* 10.8*  HGB 13.9 15.5  HCT 42.5 44.6  PLT 242 388   Recent Labs    05/12/20 1623 05/15/20 0608  NA  --  131*  K  --  4.2  CL  --  90*  CO2  --  30  GLUCOSE  --  113*  BUN  --  27*  CREATININE 0.92 1.14  CALCIUM  --  9.2    Intake/Output Summary (Last 24 hours) at 05/15/2020 1016 Last data filed at 05/15/2020 0425 Gross per 24 hour  Intake 720 ml  Output 1000 ml  Net -280 ml        Physical Exam: Vital Signs Blood pressure 138/88, pulse 60, temperature (!) 97.5 F (36.4 C), temperature source Oral, resp. rate 20, height 5' 9.02" (1.753 m), weight 103.4 kg, SpO2 99 %.   Constitutional: No distress . Vital signs reviewed. HEENT: EOMI, oral membranes moist Neck: supple Cardiovascular: RRR without murmur. No JVD    Respiratory/Chest: CTA Bilaterally without wheezes or rales. Normal effort    GI/Abdomen: BS +, non-tender, non-distended Ext: no clubbing, cyanosis, or edema Psych: pleasant and cooperative Neurologic slow to awaken. Does respond to verbal and tactile stim. Moves all 4 limbs and senses pain. Musculoskeletal: Full passive range of motion in all 4 extremities. No joint swelling   Assessment/Plan: 1. Functional deficits which require 3+ hours per day of interdisciplinary therapy in a comprehensive inpatient rehab setting.  Physiatrist is providing close team supervision and 24 hour management of active medical problems listed below.  Physiatrist and rehab team continue to assess barriers to discharge/monitor patient progress toward functional and medical goals  Care Tool:  Bathing     Body parts bathed by patient: Right arm,Left arm,Chest,Abdomen,Right upper leg,Left upper leg,Face   Body parts bathed by helper: Front perineal area,Buttocks,Right lower leg,Left lower leg     Bathing assist Assist Level: Moderate Assistance - Patient 50 - 74%     Upper Body Dressing/Undressing Upper body dressing   What is the patient wearing?: Hospital gown only,Pull over shirt    Upper body assist Assist Level: Minimal Assistance - Patient > 75%    Lower Body Dressing/Undressing Lower body dressing      What is the patient wearing?: Incontinence brief     Lower body assist Assist for lower body dressing: Minimal Assistance - Patient > 75%     Toileting Toileting    Toileting assist Assist for toileting: Maximal Assistance - Patient 25 - 49% Assistive Device Comment: Urinal   Transfers Chair/bed transfer  Transfers assist     Chair/bed transfer assist level: Moderate Assistance - Patient 50 - 74%     Locomotion Ambulation   Ambulation assist      Assist level: Moderate Assistance - Patient 50 - 74% Assistive device: Hand held assist Max distance: 149ft   Walk 10 feet activity   Assist     Assist level: Moderate Assistance - Patient - 50 - 74% Assistive device: Hand held assist   Walk 50 feet activity   Assist    Assist level: Moderate Assistance - Patient -  50 - 74% Assistive device: Hand held assist    Walk 150 feet activity   Assist    Assist level: Moderate Assistance - Patient - 50 - 74% Assistive device: Hand held assist    Walk 10 feet on uneven surface  activity   Assist Walk 10 feet on uneven surfaces activity did not occur: Safety/medical concerns         Wheelchair     Assist Will patient use wheelchair at discharge?: No             Wheelchair 50 feet with 2 turns activity    Assist            Wheelchair 150 feet activity     Assist          Blood pressure 138/88, pulse 60,  temperature (!) 97.5 F (36.4 C), temperature source Oral, resp. rate 20, height 5' 9.02" (1.753 m), weight 103.4 kg, SpO2 99 %.    Medical Problem List and Plan: 1.TBI/SDH/nondisplaced right occipital bone fracturesecondary tounwitnessed fall 05/04/2020 -ELOS/Goals: 18-21 days, supervision to min assist goals with PT, OT, SLP -will place in enclosure bed for safety when one is available.  2. Antithrombotics: -DVT/anticoagulation:Subcutaneous heparin 05/08/2020 -antiplatelet therapy: N/A 3. Pain Management:Oxycodone as needed 4. Mood/sleep:Melatonin 5 mg nightly as needed. -1/31 seroquel initiated last night. Will increase to 50mg  given that he only slept 4 hours  -continue ritalin 49m qam fornow  -continue sleep chart -antipsychotic agents: seroquel 5. Neuropsych: This patientis notcapable of making decisions on hisown behalf. 6. Skin/Wound Care:Routine skin checks 7. Fluids/Electrolytes/Nutrition: -encourage PO intake -I personally reviewed the patient's labs today.    -BUN/Cr holding, sodium lower 131  -recheck labs Tuesday 8. Seizure prophylaxis. Keppra 1000 mg mg twice daily 9. Atrial fibrillation with RVR. Intravenous Cardizem discontinued. Continue Coreg 25 mg twice daily -HR&R controlled and regular at present 10. Hypertension. Norvasc 10 mg daily, Coreg 25 mg twice daily. Monitor with increased mobility Vitals:   05/15/20 0158 05/15/20 0424  BP: 118/66 138/88  Pulse: 62 60  Resp:  20  Temp:  (!) 97.5 F (36.4 C)  SpO2:  99%    -HCTZ added 1/29---will dc given hyponatremia 11. Decreased nutritional storage/Dysphagia: -Diet has been advanced to regular.  -off TF since admission -push po, seems to like drinks/shakes 12. History of alcohol use. Monitor for any signs of withdrawal 13. Constipation.  MiraLAX as directed. Adjust bowel program as needed. 14. History recent Covid March and then again December 2021. Rapid antigen test was negative. It was felt that Covid positive PCR test indicated dead virus shedding rather than active infection and isolation discontinued.     LOS: 3 days A FACE TO FACE EVALUATION WAS PERFORMED  January 2022 05/15/2020, 10:16 AM

## 2020-05-15 NOTE — Progress Notes (Signed)
Pt called out to nurse station explaining how he needed medcation to help him sleep. Pt cursed at tech and repeatedly requested to be let out of enclosure bed. Primary nurse gave trazodone. Charge nurse and primary nurse listened to pt complaints and educated pt on safety. Pt stable at this time. RN will continue to monitor.

## 2020-05-15 NOTE — Progress Notes (Signed)
Patient ID: Timothy Case, male   DOB: 1956/03/17, 65 y.o.   MRN: [id number]  SW unable to meet with pt due to increased agitation.   SW spoke with pt wife Timothy Case [phone] and completed assessment. Wife does report her primary concern is understanding when she will see pt make a "turn" as seeing pt behaviors is challenging. She reported she would like to speak with attending when possible. SW informed attending and PA on her request.   Cecile Sheerer, MSW, LCSWA Office: [phone] Cell: [phone] Fax: [phone]

## 2020-05-15 NOTE — Progress Notes (Signed)
Inpatient Rehabilitation Care Coordinator Assessment and Plan Patient Details  Name: Timothy Case MRN: [id number] Date of Birth: 1956-02-16  Today's Date: 05/15/2020  Hospital Problems: Principal Problem:   TBI (traumatic brain injury) [facility])  Past Medical History:  Past Medical History:  Diagnosis Date  . Chronic lower back pain    "q am; cause I'm overweight" (04/21/2012)  . Essential familial hyperlipidemia   . GERD (gastroesophageal reflux disease)   . Hyperlipidemia   . Hypertension   . Shortness of breath    "lying down; sometimes" (04/21/2012)  . Stroke (HCC) 04/21/2012   "they are leaning towards mini stroke today; all S/S still gone now except little chest pain" (04/21/2012)   Past Surgical History:  Past Surgical History:  Procedure Laterality Date  . KNEE ARTHROSCOPY  ~ 2008   "both knees; ~ 6 months apart" (04/21/2012)   Social History:  reports that he has never smoked. He has never used smokeless tobacco. He reports current alcohol use of about 6.0 standard drinks of alcohol per week. He reports that he does not use drugs.  Family / Support Systems Marital Status: Married How Long?: 38 years Patient Roles: Spouse,Parent Spouse/Significant Other: [name] (wife): [phone] Children: 2 adult children with current wife; 3 adult children from previous marriage (2 lives in [place] and 1 in [place]). Other Supports: None reported Anticipated Caregiver: wife [name] Ability/Limitations of Caregiver: None reported Caregiver Availability: 24/7 Family Dynamics: Pt lives with his wife. Pt recently retired in Dec 2021.  Social History Preferred language: English Religion: Catholic Cultural Background: Pt worked as Financial planner for 41 years Education: Masters Read: Yes Write: Yes Employment Status: Retired Date Retired/Disabled/Unemployed: Dec 2021 Age Retired: 72 (y.o.) Marine scientist Issues: Denies Guardian/Conservator: N/A    Abuse/Neglect Abuse/Neglect Assessment Can Be Completed: Unable to assess, patient is non-responsive or altered mental status Physical Abuse: Denies Verbal Abuse: Denies Sexual Abuse: Denies Exploitation of patient/patient's resources: Denies Self-Neglect: Denies  Emotional Status Pt's affect, behavior and adjustment status: Pt agitated at time of visit and not able to complete asessment Recent Psychosocial Issues: Wife denies Psychiatric History: Wife denies Substance Abuse History: Wife reports pt would have a glass of wine atleast 5xs per week. Reports use of bourbon but unsure on amount/frequency.  Patient / Family Perceptions, Expectations & Goals Pt/Family understanding of illness & functional limitations: Pt wife have general understanding of care needs Premorbid pt/family roles/activities: Independent Anticipated changes in roles/activities/participation: Pt will need assistance with ADLs/IADLs  Community Resources [employer]: None Premorbid Home Care/DME Agencies: None Transportation available at discharge: wife Resource referrals recommended: Neuropsychology  Discharge Planning Living Arrangements: Spouse/significant other Support Systems: Spouse/significant other,Children Type of Residence: Private residence Insurance Resources: Actuary (specify) Counselling psychologist Managed Care) Financial Resources: Social Security,Family Support Financial Screen Referred: No Living Expenses: Own Money Management: Spouse Does the patient have any problems obtaining your medications?: No Home Management: wife managed all housekeeping and meals Patient/Family Preliminary Plans: No changes Care Coordinator Barriers to Discharge: Decreased caregiver support,Lack of/limited family support Care Coordinator Barriers to Discharge Comments: wife is his primary caregiver aside from their son Janyth Pupa that will visit from [place] on the weekends. Care Coordinator  Anticipated Follow Up Needs: HH/OP  Clinical Impression SW completed assessment with pt wife [name]. SW introduced self, explain role, and discuss discharge process. Pt is not a Cytogeneticist. No HCPOA. No DME needed.   Paeton Studer A Django Nguyen 05/15/2020, 2:18 PM

## 2020-05-15 NOTE — IPOC Note (Signed)
Overall Plan of Care [facility]) Patient Details Name: Stella Encarnacion MRN: [id number] DOB: 1955-06-16  Admitting Diagnosis: TBI (traumatic brain injury) [facility])  Hospital Problems: Principal Problem:   TBI (traumatic brain injury) (HCC)     Functional Problem List: Nursing Bladder,Behavior,Bowel,Medication Management,Pain,Safety  PT Balance,Perception,Behavior,Safety,Sensory,Edema,Endurance,Skin Integrity,Motor,Nutrition,Pain  OT Balance,Cognition,Edema,Endurance,Motor,Nutrition,Pain,Perception,Safety,Behavior,Sensory,Skin Integrity,Vision  SLP Cognition,Nutrition  TR         Basic ADL's: OT Grooming,Eating,Bathing,Dressing,Toileting     Advanced  ADL's: OT       Transfers: PT Bed Mobility,Bed to Chair,Car,Furniture,Floor  OT Toilet,Tub/Shower     Locomotion: PT Ambulation,Stairs     Additional Impairments: OT    SLP Swallowing,Social Cognition   Problem Solving,Memory,Attention,Awareness  TR      Anticipated Outcomes Item Anticipated Outcome  Self Feeding S  Swallowing  mod I   Basic self-care  S  Toileting  S   Bathroom Transfers S  Bowel/Bladder  Patient will be able to regain continence while on CIR  Transfers  supervision  Locomotion  supervision  Communication     Cognition  Min assist  Pain  pain will be managed witin an acceptable level whike on CIR  Safety/Judgment  atiet will have no fall with injury while on CIR   Therapy Plan: PT Intensity: Minimum of 1-2 x/day ,45 to 90 minutes PT Frequency: 5 out of 7 days PT Duration Estimated Length of Stay: ~ 3-3.5weeks OT Intensity: Minimum of 1-2 x/day, 45 to 90 minutes OT Frequency: 5 out of 7 days OT Duration/Estimated Length of Stay: 3-3.5 weeks SLP Intensity: Minumum of 1-2 x/day, 30 to 90 minutes SLP Frequency: 3 to 5 out of 7 days SLP Duration/Estimated Length of Stay: 21-25 days   Due to the current state of emergency, patients may not be receiving their 3-hours of  Medicare-mandated therapy.   Team Interventions: Nursing Interventions Patient/Family Education,Pain Management,Bladder Management,Bowel Management,Medication Management,Disease Management/Prevention,Psychosocial Systems developer  PT interventions Ambulation/gait training,Community Teaching laboratory technician re-education,UE/LE Strength taining/ROM,Stair training,Psychosocial support,Balance/vestibular training,Discharge planning,Functional electrical stimulation,Pain management,Skin care/wound management,Therapeutic Activities,UE/LE Coordination activities,Disease management/prevention,Cognitive remediation/compensation,Patient/family education,Functional mobility training,Splinting/orthotics,Therapeutic Exercise,Visual/perceptual remediation/compensation  OT Interventions Balance/vestibular training,Discharge planning,Functional electrical stimulation,Pain management,Self Care/advanced ADL retraining,Therapeutic Activities,UE/LE Coordination activities,Visual/perceptual remediation/compensation,Therapeutic Exercise,Skin care/wound managment,Patient/family education,Functional mobility training,Disease mangement/prevention,Cognitive remediation/compensation,Community Teaching laboratory technician re-education,Psychosocial support,Splinting/orthotics,UE/LE Strength taining/ROM,Wheelchair propulsion/positioning  SLP Interventions Cognitive remediation/compensation,Cueing hierarchy,Functional tasks,Patient/family education,Environmental controls,Dysphagia/aspiration precaution training,Internal/external aids  TR Interventions    SW/CM Interventions Discharge Planning,Psychosocial Support,Patient/Family Education   Barriers to Discharge MD  Medical stability  Nursing Incontinence,Behavior    PT Home environment access/layout,Behavior,Nutrition means    OT      SLP      SW Decreased  caregiver support,Lack of/limited family support wife is his primary caregiver aside from their son Janyth Pupa that will visit from [place] on the weekends.   Team Discharge Planning: Destination: PT-Home ,OT- Home , SLP-Home Projected Follow-up: PT-Outpatient PT,24 hour supervision/assistance, OT-  Outpatient OT, SLP-Home Health SLP,24 hour supervision/assistance Projected Equipment Needs: PT-To be determined, OT- 3 in 1 bedside comode, SLP-None recommended by SLP Equipment Details: PT- , OT-  Patient/family involved in discharge planning: PT- Patient,Family member/caregiver,  OT-Family member/caregiver,Patient, SLP-Patient unable/family or caregive not available  MD ELOS: 20-25 days Medical Rehab Prognosis:  Excellent Assessment: The patient has been admitted for CIR therapies with the diagnosis of TBI. The team will be addressing functional mobility, strength, stamina, balance, safety, adaptive techniques and equipment, self-care, bowel and bladder mgt, patient and caregiver education, NMR, cognition, communication, behavior, swallowing/nutrition. Goals have been set at supervision for self-care  and mobility and min assist for cognition.   Due to the current state of emergency, patients may not be receiving their 3 hours per day of Medicare-mandated therapy.    Ranelle Oyster, MD, FAAPMR      See Team Conference Notes for weekly updates to the plan of care

## 2020-05-15 NOTE — Care Management (Signed)
Inpatient Rehabilitation Center Individual Statement of Services  Patient Name:  Timothy Case [facility]  Date:  05/15/2020  Welcome to the Inpatient Rehabilitation Center.  Our goal is to provide you with an individualized program based on your diagnosis and situation, designed to meet your specific needs.  With this comprehensive rehabilitation program, you will be expected to participate in at least 3 hours of rehabilitation therapies Monday-Friday, with modified therapy programming on the weekends.  Your rehabilitation program will include the following services:  Physical Therapy (PT), Occupational Therapy (OT), Speech Therapy (ST), 24 hour per day rehabilitation nursing, Therapeutic Recreaction (TR), Psychology, Neuropsychology, Care Coordinator, Rehabilitation Medicine, Nutrition Services, Pharmacy Services and Other  Weekly team conferences will be held on Tuesdays to discuss your progress.  Your Inpatient Rehabilitation Care Coordinator will talk with you frequently to get your input and to update you on team discussions.  Team conferences with you and your family in attendance may also be held.  Expected length of stay: 3-3.5 weeks  Overall anticipated outcome: Supervision  Depending on your progress and recovery, your program may change. Your Inpatient Rehabilitation Care Coordinator will coordinate services and will keep you informed of any changes. Your Inpatient Rehabilitation Care Coordinator's name and contact numbers are listed  below.  The following services may also be recommended but are not provided by the Inpatient Rehabilitation Center:   Driving Evaluations  Home Health Rehabiltiation Services  Outpatient Rehabilitation Services  Vocational Rehabilitation   Arrangements will be made to provide these services after discharge if needed.  Arrangements include referral to agencies that provide these services.  Your insurance has been verified to be:  Medicare  A/B  Your primary doctor is:  No PCP  Pertinent information will be shared with your doctor and your insurance company.  Inpatient Rehabilitation Care Coordinator:  Susie Cassette [phone] or ([phone]  Information discussed with and copy given to patient by: Gretchen Short, 05/15/2020, 9:40 AM

## 2020-05-15 NOTE — Progress Notes (Signed)
Pt slept for a total of 4 hours. Pt constantly on the call light with frequent request. Pt stable at this time.

## 2020-05-15 NOTE — Progress Notes (Signed)
Inpatient Rehabilitation  Patient information reviewed and entered into eRehab system by Nettie Cromwell M. Avleen Bordwell, M.A., CCC/SLP, PPS Coordinator.  Information including medical coding, functional ability and quality indicators will be reviewed and updated through discharge.    

## 2020-05-15 NOTE — Progress Notes (Addendum)
Physical Therapy Session Note  Patient Details  Name: Timothy Case MRN: [id number] Date of Birth: [birth year]  Today's Date: 05/15/2020 PT Individual Time: 1300-1315 PT Individual Time Calculation (min): 15 min   Short Term Goals: Week 1:  PT Short Term Goal 1 (Week 1): Pt will perform supine<>sit with supervision PT Short Term Goal 2 (Week 1): Pt will perform sit<>stands using LRAD with min assist PT Short Term Goal 3 (Week 1): Pt will perform bed<>chair transfers using LRAD with min assist PT Short Term Goal 4 (Week 1): Pt will ambulate at least 157ft using LRAD with min assist PT Short Term Goal 5 (Week 1): Pt will ascend/descend 4 steps using B HRs with min assist  Skilled Therapeutic Interventions/Progress Updates:     1300-1315 Patient in secured enclosure bed upon PT arrival. Patient asleep initially, but easily aroused to verbal stimulation. Patient initially declined PT session, stating "I do not want any physical therapy." PT educated patient about PT role and goals. Patient oriented to place, initially stated he was here because his wife wouldn't let him leave, but with min cues was able to state that he fell and hit his head. PT provided education on TBI and recovery. Patient then requested to rest due to "not sleeping for 11 days." Declined offers for toileting, stretching, and ambulatory activities. Patient's tone very short, but patient appreciative of therapist's time and offers. Patient requesting to rest and declined all therapeutic activity at this time due to fatigue. Patient missed 45 min of skilled PT due to fatigue, RN made aware. Will attempt to make-up missed time as able.    Patient in secured enclosure bed at end of session with breaks locked and all needs within reach.   1405 Patient in secure enclosure bed upon PT arrival in attempt to make up missed time. Patient declined all therapeutic activities offered at this time continuing to report increased  fatigue. Patient agreeable to working with therapy tomorrow, but not later today.   Therapy Documentation Precautions:  Precautions Precautions: Fall,Other (comment) Precaution Comments: impaired safety awareness Restrictions Weight Bearing Restrictions: No General: PT Amount of Missed Time (min): 45 Minutes PT Missed Treatment Reason: Patient fatigue   Therapy/Group: Individual Therapy  Sahmya Arai L Airika Alkhatib PT, DPT  05/15/2020, 2:22 PM

## 2020-05-15 NOTE — Progress Notes (Signed)
Occupational Therapy Session Note  Patient Details  Name: Timothy Case MRN: [id number] Date of Birth: Oct 29, 1955  Today's Date: 05/15/2020 OT Individual Time: 6067-7034 OT Individual Time Calculation (min): 65 min  and Today's Date: 05/15/2020 OT Missed Time: 10 Minutes Missed Time Reason: Patient fatigue   Short Term Goals: Week 1:  OT Short Term Goal 1 (Week 1): Pt will maintain arousal for >30 min to participate in tx session consistently OT Short Term Goal 2 (Week 1): pt will transfer to toilet wiht MIN A OT Short Term Goal 3 (Week 1): Pt will thread BLE into pants OT Short Term Goal 4 (Week 1): Pt will sequence oral care with MOD VC  Skilled Therapeutic Interventions/Progress Updates:    Pt received supine in enclosure bed with c/o headache, un-rated. Pt with rapid, perseverative speech on going home. His wife was present throughout session and supportive. Max cueing for redirection from going home throughout session. Most successful redirection was to talk about family and personal life. Pt declined any bathing but did agree to don pants at bed level, with min A required. He came to EOB with min cueing. Max A to don socks. Pt stood from EOB with min A and completed ambulatory transfer to the sink with min A. He stood to brush his teeth and required seated rest break following. He elected to return to enclosure bed and to supine. The bed was secured closed. Remainder of session focused on TBI education with pt's wife. Provided TBI booklet and edu re current RLAS V-VI with some IV behaviors still present. Discussed OT POC, CIR ,and tomorrows conference. Also discussed her visitation schedule. Pt was left supine with all needs met, bed secured.   Therapy Documentation Precautions:  Precautions Precautions: Fall,Other (comment) Precaution Comments: impaired safety awareness Restrictions Weight Bearing Restrictions: No   Therapy/Group: Individual Therapy  Curtis Sites 05/15/2020, 6:20 AM

## 2020-05-15 NOTE — Progress Notes (Signed)
At 0215am Pt called to nurse station complaining of decreased sensation to right leg and HA. No changes in assessment besides decreased sensation. Pt stated, "I have less feeling in my right leg, but I don't have a HA." Vitals taken. RN notified Dr.Kirstein. Orders qiven to do a neuro check in q4 hr. Pt stable at this time. RN will continue to monitor.

## 2020-05-16 LAB — BASIC METABOLIC PANEL
Anion gap: 14 (ref 5–15)
BUN: 29 mg/dL — ABNORMAL HIGH (ref 8–23)
CO2: 28 mmol/L (ref 22–32)
Calcium: 9.5 mg/dL (ref 8.9–10.3)
Chloride: 91 mmol/L — ABNORMAL LOW (ref 98–111)
Creatinine, Ser: 1.21 mg/dL (ref 0.61–1.24)
GFR, Estimated: >60 mL/min (ref >60)
Glucose, Bld: 114 mg/dL — ABNORMAL HIGH (ref 70–99)
Potassium: 4.3 mmol/L (ref 3.5–5.1)
Sodium: 133 mmol/L — ABNORMAL LOW (ref 135–145)

## 2020-05-16 LAB — PREALBUMIN: Prealbumin: 22.7 mg/dL (ref 18–38)

## 2020-05-16 MED ORDER — QUETIAPINE FUMARATE 50 MG PO TABS
100.0000 mg | ORAL_TABLET | Freq: Every day | ORAL | Status: DC
  Administered 2020-05-16 – 2020-05-29 (×14): 100 mg via ORAL
  Filled 2020-05-16 (×14): qty 2

## 2020-05-16 MED ORDER — PROPRANOLOL HCL 20 MG PO TABS
20.0000 mg | ORAL_TABLET | Freq: Three times a day (TID) | ORAL | Status: DC
  Administered 2020-05-16 – 2020-05-17 (×4): 20 mg via ORAL
  Filled 2020-05-16 (×4): qty 1

## 2020-05-16 MED ORDER — QUETIAPINE FUMARATE 25 MG PO TABS
25.0000 mg | ORAL_TABLET | Freq: Every day | ORAL | Status: DC
  Administered 2020-05-17: 25 mg via ORAL
  Filled 2020-05-16: qty 1

## 2020-05-16 NOTE — Plan of Care (Signed)
  Problem: RH BOWEL ELIMINATION Goal: RH STG MANAGE BOWEL WITH ASSISTANCE Description: STG Manage Bowel with mod I Assistance. Outcome: Progressing Goal: RH STG MANAGE BOWEL W/MEDICATION W/ASSISTANCE Description: STG Manage Bowel with Medication with mod I Assistance. Outcome: Progressing   Problem: RH BLADDER ELIMINATION Goal: RH STG MANAGE BLADDER WITH ASSISTANCE Description: STG Manage Bladder With mod I Assistance Outcome: Progressing   Problem: RH SKIN INTEGRITY Goal: RH STG SKIN FREE OF INFECTION/BREAKDOWN Outcome: Progressing Goal: RH STG MAINTAIN SKIN INTEGRITY WITH ASSISTANCE Description: STG Maintain Skin Integrity With mod I Assistance. Outcome: Progressing   Problem: RH PAIN MANAGEMENT Goal: RH STG PAIN MANAGED AT OR BELOW PT'S PAIN GOAL Description: Pain scale <4/10 Outcome: Progressing   Problem: RH SAFETY Goal: RH STG ADHERE TO SAFETY PRECAUTIONS W/ASSISTANCE/DEVICE Description: STG Adhere to Safety Precautions With min Assistance/Device. Outcome: Not Progressing   Problem: RH COGNITION-NURSING Goal: RH STG USES MEMORY AIDS/STRATEGIES W/ASSIST TO PROBLEM SOLVE Description: STG Uses Memory Aids/Strategies With min Assistance to Problem Solve. Outcome: Not Progressing   Problem: RH KNOWLEDGE DEFICIT BRAIN INJURY Goal: RH STG INCREASE KNOWLEDGE OF DYSPHAGIA/FLUID INTAKE Outcome: Not Progressing

## 2020-05-16 NOTE — Progress Notes (Signed)
Sleep Chart: Pt slept through the night with minimal interruptions. Pt stable at this time. RN will continue to monitor.
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
## 2020-05-16 NOTE — Plan of Care (Addendum)
Behavioral Plan: Philippa Sicks "Bobby"  Rancho Level: VI  Behavior to decrease/ eliminate:  -Agitation  -Reduced participation, unwilling to take medication/allow care  -Perseveration on going home   Changes to environment:  -Promote sleep/wake, natural light during the day, lights off at night  Interventions: - High/Low Bed -Made independent in the room -No bed alarm during day, must be on at night  -call code STARR if patient is an elopement risk or behaviors escalate beyond agitation  Recommendations for interactions with patient: -Keep tasks as functional as possible -If getting agitated, redirect conversation to about himself/business/family  -Review rules for independence in the room, sign is posted outside of door as well   Attendees:  Jake Shark, OT Feliberto Gottron, SLP  Serina Cowper, PT Regino Schultze, RN

## 2020-05-16 NOTE — Progress Notes (Signed)
Speech Language Pathology Daily Session Note  Patient Details  Name: Timothy Case MRN: [id number] Date of Birth: Jan 01, 1956  Today's Date: 05/16/2020 SLP Individual Time: 0815-0830 SLP Individual Time Calculation (min): 15 min and Today's Date: 05/16/2020 SLP Missed Time: 30 Minutes Missed Time Reason: Patient unwilling to participate  Short Term Goals: Week 1: SLP Short Term Goal 1 (Week 1): Pt will utilize external aids to reorient to place, date, and situation with mod assist multimodal cues. SLP Short Term Goal 2 (Week 1): Pt will complete basic, familiar tasks wtih mod assist multimodal cues. SLP Short Term Goal 3 (Week 1): Pt will return demonstration of at least 2 safety precautions during functional tasks with mod assist multimodal cues. SLP Short Term Goal 4 (Week 1): Pt will sustain his attention to basic, familiar tasks for ~2 minute intervals with mod cues for redirection. SLP Short Term Goal 5 (Week 1): Pt will consume regular textures and thin liquids with min cues for use of universal precautions and minimal overt s/s of aspiration.  Skilled Therapeutic Interventions: Skilled treatment session focused on cognitive goals. Upon arrival, patient was awake while supine in enclosure bed and appeared verbally frustrated. Patient perseverative on his wife coming to get him so he can go home today and some paranoia/confabulation in regards to the "thousands of people that are being killed in these beds." Patient declined any functional or structured task and reported that nothing I offered him to do was "motivating." When asked what motivates the patient he reported, "sexy bodies and fast cars." Despite Max encouragement and redirection, patient missed remaining 30 minutes of session. Will re-attempt as able. Patient left supine in enclosure bed with all needs within reach. Continue with current plan of care.       Pain No/Denies Pain   Therapy/Group: Individual  Therapy  Raeana Blinn 05/16/2020, 10:17 AM
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
## 2020-05-16 NOTE — Patient Care Conference (Signed)
Inpatient RehabilitationTeam Conference and Plan of Care Update Date: 05/16/2020   Time: 10:01 AM    Patient Name: Timothy Case [facility]      Medical Record Number: [id number]  Date of Birth: 04/18/1955 Sex: Male         Room/Bed: 4W05C/4W05C-01 Payor Info: Payor: MEDICARE / Plan: MEDICARE PART A AND B / Product Type: *No Product type* /    Admit Date/Time:  05/12/2020  4:10 PM  Primary Diagnosis:  TBI (traumatic brain injury) [facility])  Hospital Problems: Principal Problem:   TBI (traumatic brain injury) [facility])    Expected Discharge Date: Expected Discharge Date: 06/02/20  Team Members Present: Physician leading conference: Dr. Faith Rogue Care Coodinator Present: Cecile Sheerer, LCSWA;Yetta Marceaux Marlyne Beards, RN, BSN, CRRN Nurse Present: Greta Doom, RN PT Present: Sheran Lawless, PT OT Present: Jake Shark, OT SLP Present: Feliberto Gottron, SLP PPS Coordinator present : Edson Snowball, Park Breed, SLP     Current Status/Progress Goal Weekly Team Focus  Bowel/Bladder   continent bowel and bladder LBM 2/1  Remain continent  Offer toileting q 2 hour rounds   Swallow/Nutrition/ Hydration             ADL's   Restless/perseverative on going home, min A ADLs overal, min A transfers, RLAS V  Supervision overall  Functional participation in ADLs, ADL retraining, cognitive retraining/orientation   Mobility   Min-mod A overall, gait 150 ft HHA, limited by lethargy/fatigue  Supervision overall  Balance,activity tolerance, functional mobiity, gait and stair training, safety awareness, cognitive remediation, dual task challenge, path finding, patient/caregiver education   Communication             Safety/Cognition/ Behavioral Observations  Rancho Level V-Max A  Min A  awareness, participation, decreased agitiation, problem solving   Pain   8/10 headache- per patient/ inconsistent  <3 out of 10  assess and treat PRN   Skin   Scab to Rt elbow and brusing to BUE  no new skin  breakdown  Assess skin q shift     Discharge Planning:      Team Discussion: More agitated the last few days. Making changes to medications. 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 PEG is out, stopping Ritalin, update the Behavioral plan, Son and wife to be caregivers. Wife is concerned she is the one that gets him agitated. Continent B/B, skin CDI, complains of headache versus ache all over. Push fluids, patient likes Ensure as well. Wife is to be main caregiver. Patient on target to meet rehab goals: Min assist for ADL's, currently Rancho V, and has supervision goals. Min assist with supervision goals for mobility. SLP working on problem solving, participation, awareness, and decreased agitation.   *See Care Plan and progress notes for long and short-term goals.   Revisions to Treatment Plan:  Discontinue Ritalin.  Teaching Needs: Family education, medication management, behavioral management, transfer training, gait training, safety awareness.  Current Barriers to Discharge: Inaccessible home environment, Decreased caregiver support, Home enviroment access/layout, Wound care, Lack of/limited family support, Medication compliance, Behavior and Nutritional means  Possible Resolutions to Barriers: Continue current medication, encourage fluids, offer nutritional support, offer emotional support to patient and family.     Medical Summary Current Status: TBI after fall on ice with SDH. agitated at times, sleep not consistent. a little behind nutritionally  Barriers to Discharge: Behavior;Medical stability   Possible Resolutions to [employer]: continued behavioral mgt. family ed, daily assessment of labs   Continued Need for Acute Rehabilitation Level  of Care: The patient requires daily medical management by a physician with specialized training in physical medicine and rehabilitation for the following reasons: Direction of a multidisciplinary physical rehabilitation  program to maximize functional independence : Yes Medical management of patient stability for increased activity during participation in an intensive rehabilitation regime.: Yes Analysis of laboratory values and/or radiology reports with any subsequent need for medication adjustment and/or medical intervention. : Yes   I attest that I was present, lead the team conference, and concur with the assessment and plan of the team.   Tennis Must 05/16/2020, 4:05 PM

## 2020-05-16 NOTE — Progress Notes (Addendum)
Patient ID: Timothy Case, male   DOB: Jan 21, 1956, 65 y.o.   MRN: [id number]  SW made efforts to leave message for pt wife Timothy Case [phone]) to provide updates from team conference but voicemail full. SW will continue to make efforts.  *SW spoke with pt wife Timothy Case to provide updates and pt d/c date 2/18. SW informed there will continue to be updates each week. She intends to come in for some of patient's therapies tomorrow. SW encouraged her to follow-up with nursing staff to get schedule.   Cecile Sheerer, MSW, LCSWA Office: [phone] Cell: [phone] Fax: [phone]

## 2020-05-17 ENCOUNTER — Telehealth: Payer: Self-pay

## 2020-05-17 MED ORDER — PROPRANOLOL HCL 20 MG PO TABS
40.0000 mg | ORAL_TABLET | Freq: Three times a day (TID) | ORAL | Status: DC
  Administered 2020-05-17 – 2020-05-18 (×2): 40 mg via ORAL
  Filled 2020-05-17 (×2): qty 2

## 2020-05-17 MED ORDER — QUETIAPINE FUMARATE 25 MG PO TABS
25.0000 mg | ORAL_TABLET | Freq: Two times a day (BID) | ORAL | Status: DC | PRN
  Administered 2020-05-17 – 2020-05-23 (×5): 25 mg via ORAL
  Filled 2020-05-17 (×5): qty 1

## 2020-05-17 MED ORDER — LORAZEPAM 2 MG/ML IJ SOLN: 1.0000 mg | Freq: Three times a day (TID) | INTRAMUSCULAR | Status: DC | PRN

## 2020-05-17 MED ORDER — QUETIAPINE FUMARATE 50 MG PO TABS
50.0000 mg | ORAL_TABLET | Freq: Every day | ORAL | Status: DC
  Administered 2020-05-18 – 2020-05-22 (×5): 50 mg via ORAL
  Filled 2020-05-17 (×5): qty 1

## 2020-05-17 MED ORDER — QUETIAPINE FUMARATE 25 MG PO TABS: 25.0000 mg | ORAL_TABLET | Freq: Two times a day (BID) | ORAL | Status: DC | PRN
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
## 2020-05-17 NOTE — Progress Notes (Signed)
Pt slept majority of the night.sleep chart updated. Pt stable at this time. RN will continue to monitor.

## 2020-05-17 NOTE — Telephone Encounter (Signed)
Patient wife called stating she was returning your call. Needed to speak with you.

## 2020-05-17 NOTE — Progress Notes (Addendum)
Speech Language Pathology Daily Session Note  Patient Details  Name: Timothy Case MRN: [id number] Date of Birth: October 31, 1955  Today's Date: 05/17/2020 SLP Individual Time: 1400-1445 SLP Individual Time Calculation (min): 45 min  Short Term Goals: Week 1: SLP Short Term Goal 1 (Week 1): Pt will utilize external aids to reorient to place, date, and situation with mod assist multimodal cues. SLP Short Term Goal 2 (Week 1): Pt will complete basic, familiar tasks wtih mod assist multimodal cues. SLP Short Term Goal 3 (Week 1): Pt will return demonstration of at least 2 safety precautions during functional tasks with mod assist multimodal cues. SLP Short Term Goal 4 (Week 1): Pt will sustain his attention to basic, familiar tasks for ~2 minute intervals with mod cues for redirection. SLP Short Term Goal 5 (Week 1): Pt will consume regular textures and thin liquids with min cues for use of universal precautions and minimal overt s/s of aspiration.  Skilled Therapeutic Interventions: Pt was seen for skilled ST targeting cognition. Pt was oriented to place and situation today, however verbally agitated and perseverative on leaving the hospital. Although pt initially agreed to sit edge of bed with therapist, and did so for ~1 minute, he then proceeded to ambulate in room. SLP assisted with directing pt to recliner in his room. Pt's wife and physical therapist also arrived to assist with redirecting pt and attempt to assist with transfer back to enclosure bed, although ultimately unsuccessful, as pt refused to return to bed. Education provided to pt and wife regarding brain injury and impact on pt's safety awareness and emotions/agitation. Although Max A attempts and intervention approaches used to redirect pt to functional tasks and raise awareness of current physical and cognitive deficits, pt did not seem receptive and still perseverative on leaving the hospital. As a result, he missed remaining  15 minutes of session. Pt left sitting in recliner with wife and RN present. Continue per current plan of care.        Pain Pain Assessment Pain Scale: 0-10 Pain Score: 0-No pain  Therapy/Group: Individual Therapy  Little Ishikawa 05/17/2020, 7:25 AM

## 2020-05-17 NOTE — Progress Notes (Incomplete)
Pt resting in enclosure bed. Pt has been agitated and restless through out the day.

## 2020-05-17 NOTE — Progress Notes (Signed)
Physical Therapy Session Note  Patient Details  Name: Timothy Case MRN: [id number] Date of Birth: 06-09-1955  Today's Date: 05/17/2020 PT Individual Time: 1100-1125 and 1230-1310 PT Individual Time Calculation (min): 25 min and 40 min   Short Term Goals: Week 1:  PT Short Term Goal 1 (Week 1): Pt will perform supine<>sit with supervision PT Short Term Goal 2 (Week 1): Pt will perform sit<>stands using LRAD with min assist PT Short Term Goal 3 (Week 1): Pt will perform bed<>chair transfers using LRAD with min assist PT Short Term Goal 4 (Week 1): Pt will ambulate at least 18ft using LRAD with min assist PT Short Term Goal 5 (Week 1): Pt will ascend/descend 4 steps using B HRs with min assist  Skilled Therapeutic Interventions/Progress Updates:     Session 1: Patient in secured enclosure bed asleep upon PT arrival. Patient easily aroused to verbal stimulation. Patient reported 10/10 headache and feeling "drowsey" during session, but declined calling for medication stating "I'm on too many drugs." RN made aware. PT provided repositioning, rest breaks, and distraction as pain interventions throughout session.   Vitals:BP: 105/80, HR 71  Patient initially declined getting out of bed or participating in therapy at this time due to drowsiness and headache. PT provided education on TBI symptoms and challenges with sleep wake cycle. Patient stated understanding, but continued to decline sitting EOB. Patient then stated he needed to go to the bathroom.  Therapeutic Activity: Bed Mobility: Patient performed supine to/from sit with mod A to sit up and supervision to lie down. Provided verbal cues for pushing himself up to sitting, but patient insistent on grabbing PT's hand to come to sitting at this time. B socks donned with total A for patient, he initially asked not to have them put on, but was agreeable after education on fall prevention with non-grip socks.  Transfers: Patient  performed sit to/from stand with close supervision for safety due to decreased balance and wide BOS to stand. Provided verbal cues for safety awareness and reduced BOS. Patient self selected to stand to void at the toilet. Patient continent of bladder during toileting. Performed lower body clothing management independently. Required CGA for balance/safety in standing.   Gait Training:  Patient ambulated ~15 feet to/from the bathroom with CGA-min A for balance and patient intermittently reaching out for things to stabilize with. Ambulated with wide BOS, increased lateral trunk sway, decreased step length and height, and decreased spatial awareness on the R, bumping into objects x3. Provided verbal cues for decreased BOS, visual scanning to the R, and awareness of balance deficits due to patient requesting x2 for therapist not to assist him.  Once back in the bed the patient politely stated, "I don't mean to be rude, but can you go now." PT provided positive reinforcement of this communication. Offered the patient a drink before leaving. Patient took 3 sips of water with HOB elevated. Patient stated he did not need anything further at this time. PT planning to return in 1 hour to attempt to continue session. Patient in agreement  Patient in secured enclosure bed at end of session with breaks locked, bed alarm set, and all needs within reach.  Session 2: Patient in secured enclosure upon PT arrival. Patient agitated asking to get out of the bed. Patient perseverated on calling a lawyer and his wife throughout session. He was upset about previous incontinence and then by not being able to find his phone during session. RN made aware. Patient ambulated to  the w/c in the room x2 with min A-CGA and to the recliner in the room with close supervision for safety do to patient demanding that PT not assist him. Patient ambulated as above with poor safety awareness, but without LOB. Patient sat and ate ~50% of his  lunch in the w/c during session while discussing concerns about his stay. PT provided orientation to place and situation. Patient maintained agitated with poor frustration tolerance, but maintained sitting in the chairs in the room without attempt at leaving, despite threats to do so. Maintained a calm demeanor with patient which he would intermittently mirror in his behaviors. Was redirected with discussion about his previous work and his boat, however, became frustrated stating "I already told you all of this," despite this being the first time speaking about these things to this therapist. Patient demonstrated decreased awareness of his deficits. Discussed patient's deficits, patient states "I am fine and nothing is wrong with me, I could leave here today." Patient with several requests for his phone, dressings for his elbows, and demanding to speak with a Production designer, theatre/television/film and a Clinical research associate. With assistance from nursing staff able to provide patient with a few of his requests with improved behaviors. Patient intially refused to return to the bed, but later stated his head hurt and he laid in the bed independently. RN provided pain medicine and patient compliant with closure of enclosure bed.     Patient in secured eclosure at end of session with breaks locked and all needs within reach.   Therapy Documentation Precautions:  Precautions Precautions: Fall,Other (comment) Precaution Comments: impaired safety awareness Restrictions Weight Bearing Restrictions: No   Therapy/Group: Individual Therapy  Eithel Ryall L Rece Zechman PT, DPT  05/17/2020, 11:45 AM

## 2020-05-18 MED ORDER — LEVETIRACETAM 750 MG PO TABS
750.0000 mg | ORAL_TABLET | Freq: Two times a day (BID) | ORAL | Status: DC
  Administered 2020-05-18 – 2020-05-22 (×8): 750 mg via ORAL
  Filled 2020-05-18 (×8): qty 1

## 2020-05-18 MED ORDER — LISINOPRIL 10 MG PO TABS
10.0000 mg | ORAL_TABLET | Freq: Every day | ORAL | Status: DC
  Administered 2020-05-18 – 2020-05-30 (×13): 10 mg via ORAL
  Filled 2020-05-18 (×13): qty 1

## 2020-05-18 MED ORDER — PROPRANOLOL HCL ER 60 MG PO CP24
120.0000 mg | ORAL_CAPSULE | Freq: Every day | ORAL | Status: DC
Start: 2020-05-18 — End: 2020-05-30
  Administered 2020-05-19 – 2020-05-30 (×12): 120 mg via ORAL
  Filled 2020-05-18: qty 2
  Filled 2020-05-18: qty 1
  Filled 2020-05-18 (×3): qty 2
  Filled 2020-05-18: qty 1
  Filled 2020-05-18 (×7): qty 2

## 2020-05-18 NOTE — Progress Notes (Signed)
Speech Language Pathology Daily Session Note  Patient Details  Name: Timothy Case MRN: [id number] Date of Birth: 1955/11/25  Today's Date: 05/18/2020 SLP Individual Time: 1300-1340 SLP Individual Time Calculation (min): 40 min  Short Term Goals: Week 1: SLP Short Term Goal 1 (Week 1): Pt will utilize external aids to reorient to place, date, and situation with mod assist multimodal cues. SLP Short Term Goal 2 (Week 1): Pt will complete basic, familiar tasks wtih mod assist multimodal cues. SLP Short Term Goal 3 (Week 1): Pt will return demonstration of at least 2 safety precautions during functional tasks with mod assist multimodal cues. SLP Short Term Goal 4 (Week 1): Pt will sustain his attention to basic, familiar tasks for ~2 minute intervals with mod cues for redirection. SLP Short Term Goal 5 (Week 1): Pt will consume regular textures and thin liquids with min cues for use of universal precautions and minimal overt s/s of aspiration.  Skilled Therapeutic Interventions: Pt was seen for skilled ST targeting cognitive goals. Pt received in wheelchair with RN present. Pt continues to perseverate on going home and confabulate regarding treatment conditions while inpatient. He required Max A multimodal cues in attempts to redirect conversation to functional topics and determine personally relevant cognitive goals. Pt was adamant that "nothing is wrong with his speech" and not receptive to skilled education regarding ST scope in cognitive neurorehabilitation in relation to his current deficits. When attempting to make a phone call to his wife, Max A required for problem solving and recall of the accurate steps to dial out from hospital phone. Pt left laying in enclosure bed (fully enclosed) with call bell at his side. Continue per current plan of care.          Pain Pain Assessment Pain Scale: Faces Faces Pain Scale: No hurt  Therapy/Group: Individual Therapy  Little Ishikawa 05/18/2020, 7:26 AM

## 2020-05-18 NOTE — Progress Notes (Signed)
Nutrition Follow-up  RD working remotely.  DOCUMENTATION CODES:   Not applicable  INTERVENTION:   - Continue Ensure Enlive po TID, each supplement provides 350 kcal and 20 grams of protein  - Add Magic Cup TID with meals, each supplement provides 290 kcal and 9 grams of protein  - Continue MVI with minerals daily  NUTRITION DIAGNOSIS:   Increased nutrient needs related to acute illness (COVID infection) as evidenced by estimated needs.  Ongoing  GOAL:   Patient will meet greater than or equal to 90% of their needs  Progressing  MONITOR:   PO intake,Supplement acceptance,TF tolerance,Skin,Weight trends,Labs,I & O's  REASON FOR ASSESSMENT:   Consult Enteral/tube feeding initiation and management  ASSESSMENT:   Patient with PMH significant for GERD, HLD, HTN, CVA, and recent COVID infection. Admitted to [facility] for bilateral SDH, diffuse SAH, bilateral frontal contusions, and mall left cerebellar hemorrhagic contusion. Transferred to [facility] for comprehensive rehab program.  Noted pt in enclosure bed for safety. Unable to reach pt via phone call to room.  Meal completion has been sporadic but improved to 75-100% for the last 2 documented meals. Pt accepting >90% of Ensure Enlive supplements per [facility] documentation. Will continue with current supplement regimen.  [facility] admit weight: 103.4 kg Current weight: 99.2 kg  Noted pt with a weight loss of 4.2 kg since admission to [facility]. This is a 4.1% weight loss in less than 1 week which is severe and significant for timeframe. RD will add Magic Cups TID with meals to aid pt in meeting kcal and protein needs. If PO intake does not improve consistently, may need to consider replacing Cortrak and initiating nocturnal tube feeds for nutrition support.  Meal Completion: 0-100%  Medications reviewed and include: colace, Ensure Enlive TID, miralax,   Labs reviewed: sodium 133, BUN 29  NUTRITION - FOCUSED PHYSICAL EXAM:  Unable to complete  at this time. RD working remotely.  Diet Order:   Diet Order            Diet regular Room service appropriate? Yes; Fluid consistency: Thin  Diet effective now                 EDUCATION NEEDS:   Education needs have been addressed  Skin:  Skin Assessment: Reviewed RN Assessment  Last BM:  05/16/20 large type 6  Height:   Ht Readings from Last 1 Encounters:  05/12/20 5' 9.02" (1.753 m)    Weight:   Wt Readings from Last 1 Encounters:  05/18/20 99.2 kg    BMI:  Body mass index is 32.28 kg/m.  Estimated Nutritional Needs:   Kcal:  2200-2400 kcal  Protein:  110-125 grams  Fluid:  >/= 2 L/day    Mertie Clause, MS, RD, LDN Inpatient Clinical Dietitian Please see [facility] for contact information.

## 2020-05-18 NOTE — Progress Notes (Addendum)
[place] PHYSICAL MEDICINE & REHABILITATION PROGRESS NOTE   Subjective/Complaints:  Seemed to sleep most of night. Still angry about being in enclosure bed. Working with therapy, washing up this morning.   ROS: Limited due to cognitive/behavioral    Objective:   No results found. No results for input(s): WBC, HGB, HCT, PLT in the last 72 hours. Recent Labs    05/16/20 0450  NA 133*  K 4.3  CL 91*  CO2 28  GLUCOSE 114*  BUN 29*  CREATININE 1.21  CALCIUM 9.5    Intake/Output Summary (Last 24 hours) at 05/18/2020 0903 Last data filed at 05/18/2020 0445 Gross per 24 hour  Intake 420 ml  Output --  Net 420 ml        Physical Exam: Vital Signs Blood pressure (!) 131/99, pulse 73, temperature 98.4 F (36.9 C), resp. rate 17, height 5' 9.02" (1.753 m), weight 99.2 kg, SpO2 98 %.   Constitutional: No distress . Vital signs reviewed. HEENT: EOMI, oral membranes moist Neck: supple Cardiovascular: RRR without murmur. No JVD    Respiratory/Chest: CTA Bilaterally without wheezes or rales. Normal effort    GI/Abdomen: BS +, non-tender, non-distended Ext: no clubbing, cyanosis, or edema Psych: irritable, more cooperative however.  Neurologic alert. Oriented to self only still.  Moves all 4 limbs and senses pain. Musculoskeletal: Full passive range of motion in all 4 extremities. No joint swelling   Assessment/Plan: 1. Functional deficits which require 3+ hours per day of interdisciplinary therapy in a comprehensive inpatient rehab setting.  Physiatrist is providing close team supervision and 24 hour management of active medical problems listed below.  Physiatrist and rehab team continue to assess barriers to discharge/monitor patient progress toward functional and medical goals  Care Tool:  Bathing    Body parts bathed by patient: Right arm,Left arm,Chest,Abdomen,Right upper leg,Left upper leg,Face   Body parts bathed by helper: Front perineal area,Buttocks,Right  lower leg,Left lower leg     Bathing assist Assist Level: Moderate Assistance - Patient 50 - 74%     Upper Body Dressing/Undressing Upper body dressing   What is the patient wearing?: Hospital gown only,Pull over shirt    Upper body assist Assist Level: Minimal Assistance - Patient > 75%    Lower Body Dressing/Undressing Lower body dressing      What is the patient wearing?: Incontinence brief     Lower body assist Assist for lower body dressing: Minimal Assistance - Patient > 75%     Toileting Toileting    Toileting assist Assist for toileting: Maximal Assistance - Patient 25 - 49% Assistive Device Comment: Urinal   Transfers Chair/bed transfer  Transfers assist     Chair/bed transfer assist level: Moderate Assistance - Patient 50 - 74%     Locomotion Ambulation   Ambulation assist      Assist level: Minimal Assistance - Patient > 75% Assistive device: Hand held assist Max distance: 110'   Walk 10 feet activity   Assist     Assist level: Minimal Assistance - Patient > 75% Assistive device: No Device   Walk 50 feet activity   Assist    Assist level: Minimal Assistance - Patient > 75% Assistive device: No Device    Walk 150 feet activity   Assist    Assist level: Moderate Assistance - Patient - 50 - 74% Assistive device: Hand held assist    Walk 10 feet on uneven surface  activity   Assist Walk 10 feet on uneven surfaces activity  did not occur: Safety/medical concerns         Wheelchair     Assist Will patient use wheelchair at discharge?: No             Wheelchair 50 feet with 2 turns activity    Assist            Wheelchair 150 feet activity     Assist          Blood pressure (!) 131/99, pulse 73, temperature 98.4 F (36.9 C), resp. rate 17, height 5' 9.02" (1.753 m), weight 99.2 kg, SpO2 98 %.    Medical Problem List and Plan: 1.TBI/SDH/nondisplaced right occipital bone  fracturesecondary tounwitnessed fall 05/04/2020 -ELOS/Goals: 2/18, supervision to min assist goals with PT, OT, SLP -continue  enclosure bed for safety .  -I spoke with pt's wife yesterday. Discussed his recovery, what to expect, is it normal to be agitated like this, how long will it last, etc. 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  2. Antithrombotics: -DVT/anticoagulation:Subcutaneous heparin 05/08/2020 -antiplatelet therapy: N/A 3. Pain Management:Oxycodone as needed 4. Mood/sleep:Melatonin 5 mg nightly as needed. -continue sleep chart -antipsychotic agents: seroquel  2/3 continue seroquel 100mg  qhs with 50mg  scheduled in morning   -seroquel 50mg  prn for moderate agitation   -propranolol 40mg  TID---change to XR form   -IM ativan for severe agitation   -consider re-trial of ritalin for attention   -continue enclosure bed for safety, behavior plan 5. Neuropsych: This patientis notcapable of making decisions on hisown behalf. 6. Skin/Wound Care:Routine skin checks 7. Fluids/Electrolytes/Nutrition: -encourage PO intake -I personally reviewed the patient's labs today.    -2/1 BUN/Cr with sl trend up. Sodium up to 133   -HCTZ d/c'ed 1/31  -recheck labs Friday--looks to have eaten much better 2/2, +540 cc In 8. Seizure prophylaxis. Keppra 1000 mg mg twice daily  2/3 reduce to 750mg  bid 9. Atrial fibrillation with RVR. Intravenous Cardizem discontinued. dc Coreg 25 mg as below, start propranolol for behavioral benefits -2/2 HR&R controlled and regular at present 10. Hypertension.   Vitals:   05/17/20 2016 05/18/20 0430  BP: (!) 120/93 (!) 131/99  Pulse: 76 73  Resp: 17 17  Temp: 98.4 F (36.9 C) 98.4 F (36.9 C)  SpO2: 98% 98%    2/3 Continue propranolol in place of coreg--change to XR form   -continue  norvasc   -add lisinopril 10mg  11. Decreased nutritional storage/Dysphagia: -Diet has been advanced to regular.   12. History of alcohol use. Monitor for any signs of withdrawal 13. Constipation. MiraLAX as directed. Adjust bowel program as needed.  -had large bm 2/1 14. History recent Covid March and then again December 2021. Rapid antigen test was negative. It was felt that Covid positive PCR test indicated dead virus shedding rather than active infection and isolation discontinued.     LOS: 6 days A FACE TO FACE EVALUATION WAS PERFORMED  05/18/2020, 9:03 AM

## 2020-05-18 NOTE — Progress Notes (Signed)
Occupational Therapy Session Note  Patient Details  Name: Timothy Case MRN: [id number] Date of Birth: Feb 08, 1956  Today's Date: 05/18/2020 OT Individual Time: 2951-8841 OT Individual Time Calculation (min): 12 min  and Today's Date: 05/18/2020 OT Missed Time: 33 Minutes Missed Time Reason: Patient fatigue   Today's Date: 05/18/2020 OT Individual Time: 6606-3016 OT Individual Time Calculation (min): 12 min    Short Term Goals: Week 1:  OT Short Term Goal 1 (Week 1): Pt will maintain arousal for >30 min to participate in tx session consistently OT Short Term Goal 2 (Week 1): pt will transfer to toilet wiht MIN A OT Short Term Goal 3 (Week 1): Pt will thread BLE into pants OT Short Term Goal 4 (Week 1): Pt will sequence oral care with MOD VC  Skilled Therapeutic Interventions/Progress Updates:    Session 1: 1:1. Pt received in w/c with PT present at sink. Pt agreeable to shower with mild agitation that OT needs to be in bathroom with pt, however compliant with OT holding curtain mostly closed for privacy. Pt asking "where does the water come out of?" Pt completes all transfers/ambulation with S overall using walls and furniture to steady self along the way. Pt doffs clothing in seated but agreeable to sitting to donning pants after shower. Pt has stooped posture during bathing in standing and sitting. Pt completes bathing body parts 3x with exception of bathing feet. Pt completes dressing with S and VC for safety awareness. Pt able ot turn shirt right side out without cuing Pt returns to EOB and given Qtips per requestion. Pt uses stick end of cotton swab requiring cuing for noticing and turing aroudn to cotton end. Pt requesting to rest and warm up in bed prior to completing more tx. Pt with no awareness of deficits stating, "ive hit my head dozens of times and nothing ever happened. People are just making it up for money." Exited session with pt seated in enclosure bed, exit alarm on  and call light in reach  Session 2: returned to pt room after 30-40 min to allow pt to rest. Pt requires increased time to arouse. Pt completes supine<sitting with PT pulling on OT arm to elevate trunk 2x. Pt drinks water at EOB and immediately lays back down. After a few minutes to calm, OT gently requests pt participate in tx and sit up/maybe get cup of coffee. Pt sits EOB again, holding head stating, "If I get up now the way im feeling, I will not be a good person and you dont deserve that." OT thanked pt for awareness and stating his needs. Pt lays back down and OT secures enclosure bed. Exited session with pt seated in bed, exit alarm on and call light in reach     Therapy Documentation Precautions:  Precautions Precautions: Fall,Other (comment) Precaution Comments: impaired safety awareness Restrictions Weight Bearing Restrictions: No General:   Vital Signs: Therapy Vitals Temp: 98.4 F (36.9 C) Pulse Rate: 73 Resp: 17 BP: (!) 131/99 Patient Position (if appropriate): Sitting Oxygen Therapy SpO2: 98 % Pain: Pain Assessment Pain Score: 1  ADL: ADL Grooming: Minimal assistance Where Assessed-Grooming: Edge of bed (sitting balance d/t arousal) Upper Body Bathing: Minimal assistance Where Assessed-Upper Body Bathing: Edge of bed (sitting balance A) Lower Body Bathing: Moderate assistance Where Assessed-Lower Body Bathing: Edge of bed Upper Body Dressing: Minimal assistance Where Assessed-Upper Body Dressing: Edge of bed Lower Body Dressing: Maximal assistance Where Assessed-Lower Body Dressing: Edge of bed Toileting: Maximal assistance Where Assessed-Toileting:  Bedside Commode Toilet Transfer: Moderate assistance Toilet Transfer Method: Stand pivot Toilet Transfer Equipment: Scientist, research (medical)    Praxis   Exercises:   Other Treatments:     Therapy/Group: Individual Therapy  Shon Hale 05/18/2020, 6:44 AM

## 2020-05-18 NOTE — Progress Notes (Signed)
Physical Therapy Session Note  Patient Details  Name: Timothy Case MRN: [id number] Date of Birth: 1955/12/05  Today's Date: 05/18/2020 PT Individual Time: 0810-0840 and 1345-1400 PT Individual Time Calculation (min): 30 min and 15 min   Short Term Goals: Week 1:  PT Short Term Goal 1 (Week 1): Pt will perform supine<>sit with supervision PT Short Term Goal 2 (Week 1): Pt will perform sit<>stands using LRAD with min assist PT Short Term Goal 3 (Week 1): Pt will perform bed<>chair transfers using LRAD with min assist PT Short Term Goal 4 (Week 1): Pt will ambulate at least 139ft using LRAD with min assist PT Short Term Goal 5 (Week 1): Pt will ascend/descend 4 steps using B HRs with min assist  Skilled Therapeutic Interventions/Progress Updates:     Session 1: Patient in secured enclosure bed asking to go to the bathroom upon PT arrival. Patient alert and agreeable to PT session. Patient reported 10/10 headache pain during session, RN made aware. PT provided repositioning, rest breaks, and distraction as pain interventions throughout session.   Patient frustrated and with poor attitude about having to be in the hospital throughout session, however, patient was compliant with all PT's requests throughout session.   Therapeutic Activity: Bed Mobility: Patient performed supine to/from sit with mod I.  Transfers: Patient performed sit to/from stand from the bed with supervision for safety/balance. Provided verbal cues for safety awareness. Patient was continent of bladder during session, voided in standing with supervision for safety/balance. Performed lower body clothing management independently.   Patient sat at the sink to wash his hands and face, brush his teeth, and shave with set-up assist and min cues for attending to task.  Gait Training:  Patient ambulated ~12 feet to the bathroom and ~8 ft to the sink reaching out for steadying support with CGA-close supervision,  patient less resistive to assist today without LOB. Ambulated with mild wide BOS, decreased gait speed, step height, and step length, and mild lateral trunk sway. Provided verbal cues for safety awareness and reduced BOS for safety/balance.  Patient handed off to OT while seated in the w/c at the sink at end of session.  Session 2: Patient in secured enclosure bed upon PT arrival. Patient alert and agreeable to PT session. Patient reported headache, did not provide numeric rating this session, RN made aware. PT provided repositioning, rest breaks, and distraction as pain interventions throughout session.   Patient agreeable to ambulating out of the room to improve back pain and sensory stimulation. Patient agreeable to having PT assist with gait for safety, wear a gait belt for safety, and to return to the room and the bed at end of session.   Therapeutic Activity: Bed Mobility: Patient performed supine to/from sit with mod I. Transfers: Patient performed sit to/from stand x1 with supervision for safety.   Gait Training:  Patient ambulated >250 feet without an AD with CGA. Ambulated with wide BOS, decreased gait speed, decreased step length and height, and increased toe out. Demonstrated less reaching for steadying support in the hallways and asked PT x1 to not hold his shoulder, but did allow PT to continue holding the gait belt throughout. Provided verbal cues for decreased BOS and increased step length. Patient not receptive to cues at this time. Stated "I am walking normally, you don't know what you are talking about."   Patient returned to the bed after ambulation. Discussed performing a standardized balance test to assess his fall risk level. Patient stated he  would only participate if he could go home after. Disclosed that participation would not get him home today, but would assist toward setting a date for d/c. Patient became upset and declined working with PT any more during the session.    Patient in secure enclosure bed at end of session with breaks locked and all needs within reach.    Therapy Documentation Precautions:  Precautions Precautions: Fall,Other (comment) Precaution Comments: impaired safety awareness Restrictions Weight Bearing Restrictions: No General: PT Amount of Missed Time (min): 30 Minutes    Therapy/Group: Individual Therapy  Navarro Nine L Ara Mano PT, DPT  05/18/2020, 10:28 AM

## 2020-05-19 DIAGNOSIS — S069X2D Unspecified intracranial injury with loss of consciousness of 31 minutes to 59 minutes, subsequent encounter: Secondary | ICD-10-CM

## 2020-05-19 DIAGNOSIS — R451 Restlessness and agitation: Secondary | ICD-10-CM

## 2020-05-19 DIAGNOSIS — E871 Hypo-osmolality and hyponatremia: Secondary | ICD-10-CM

## 2020-05-19 DIAGNOSIS — K5901 Slow transit constipation: Secondary | ICD-10-CM

## 2020-05-19 DIAGNOSIS — I10 Essential (primary) hypertension: Secondary | ICD-10-CM

## 2020-05-19 LAB — CBC
HCT: 43.8 % (ref 39.0–52.0)
Hemoglobin: 15.3 g/dL (ref 13.0–17.0)
MCH: 31.9 pg (ref 26.0–34.0)
MCHC: 34.9 g/dL (ref 30.0–36.0)
MCV: 91.4 fL (ref 80.0–100.0)
Platelets: 452 10*3/uL — ABNORMAL HIGH (ref 150–400)
RBC: 4.79 MIL/uL (ref 4.22–5.81)
RDW: 12.5 % (ref 11.5–15.5)
WBC: 9.3 10*3/uL (ref 4.0–10.5)
nRBC: 0 % (ref 0.0–0.2)

## 2020-05-19 LAB — BASIC METABOLIC PANEL
Anion gap: 12 (ref 5–15)
BUN: 26 mg/dL — ABNORMAL HIGH (ref 8–23)
CO2: 27 mmol/L (ref 22–32)
Calcium: 9.3 mg/dL (ref 8.9–10.3)
Chloride: 93 mmol/L — ABNORMAL LOW (ref 98–111)
Creatinine, Ser: 1.19 mg/dL (ref 0.61–1.24)
GFR, Estimated: >60 mL/min (ref >60)
Glucose, Bld: 117 mg/dL — ABNORMAL HIGH (ref 70–99)
Potassium: 4.4 mmol/L (ref 3.5–5.1)
Sodium: 132 mmol/L — ABNORMAL LOW (ref 135–145)

## 2020-05-19 LAB — GLUCOSE, CAPILLARY: Glucose-Capillary: 106 mg/dL — ABNORMAL HIGH (ref 70–99)

## 2020-05-19 MED ORDER — SENNOSIDES-DOCUSATE SODIUM 8.6-50 MG PO TABS
1.0000 | ORAL_TABLET | Freq: Two times a day (BID) | ORAL | Status: DC
  Administered 2020-05-19 – 2020-05-30 (×23): 1 via ORAL
  Filled 2020-05-19 (×23): qty 1

## 2020-05-19 NOTE — Progress Notes (Incomplete)
Physical Therapy Session Note  Patient Details  Name: Timothy Case MRN: [id number] Date of Birth: [birth year]  Today's Date: 05/19/2020 PT Individual Time:  -      Short Term Goals: Week 1:  PT Short Term Goal 1 (Week 1): Pt will perform supine<>sit with supervision PT Short Term Goal 2 (Week 1): Pt will perform sit<>stands using LRAD with min assist PT Short Term Goal 3 (Week 1): Pt will perform bed<>chair transfers using LRAD with min assist PT Short Term Goal 4 (Week 1): Pt will ambulate at least 158ft using LRAD with min assist PT Short Term Goal 5 (Week 1): Pt will ascend/descend 4 steps using B HRs with min assist  Skilled Therapeutic Interventions/Progress Updates:     Session 1: Patient in *** upon PT arrival. Patient alert and agreeable to PT session. Patient denied pain during session.  Therapeutic Activity: Bed Mobility: Patient performed supine to/from sit with ***. Provided verbal cues for ***. Transfers: Patient performed sit to/from stand x*** with ***. Provided verbal cues for***.  Gait Training:  Patient ambulated *** feet using *** with ***. Ambulated with ***. Provided verbal cues for ***.  Wheelchair Mobility:  Patient propelled wheelchair *** feet with ***. Provided verbal cues for ***  Neuromuscular Re-ed: Patient performed the following *** activities: ***  Therapeutic Exercise: Patient performed the following exercises with verbal and tactile cues for proper technique. ***  Patient in *** at end of session with breaks locked, *** alarm set, and all needs within reach.   Session 2: Patient in *** upon PT arrival. Patient alert and agreeable to PT session. Patient denied pain during session.  Therapeutic Activity: Bed Mobility: Patient performed supine to/from sit with ***. Provided verbal cues for ***. Transfers: Patient performed sit to/from stand x*** with ***. Provided verbal cues for***.  Gait Training:  Patient ambulated ***  feet using *** with ***. Ambulated with ***. Provided verbal cues for ***.  Wheelchair Mobility:  Patient propelled wheelchair *** feet with ***. Provided verbal cues for ***  Neuromuscular Re-ed: Patient performed the following *** activities: ***  Therapeutic Exercise: Patient performed the following exercises with verbal and tactile cues for proper technique. ***  Patient in *** at end of session with breaks locked, *** alarm set, and all needs within reach.    Therapy Documentation Precautions:  Precautions Precautions: Fall,Other (comment) Precaution Comments: impaired safety awareness Restrictions Weight Bearing Restrictions: No General:   Vital Signs:   Pain: Pain Assessment Pain Scale: 0-10 Pain Score: Asleep Faces Pain Scale: No hurt Pain Type: Acute pain Pain Location: Head Pain Descriptors / Indicators: Aching;Dull Pain Frequency: Intermittent Pain Onset: Awakened from sleep Patients Stated Pain Goal: 0 Mobility:   Locomotion :    Trunk/Postural Assessment :    Balance:   Exercises:   Other Treatments:      Therapy/Group: Individual Therapy  Tien Aispuro L Carolin Quang PT, DPT  05/19/2020, 5:47 AM

## 2020-05-19 NOTE — Progress Notes (Signed)
Physical Therapy Note  Patient Details  Name: Timothy Case MRN: [id number] Date of Birth: 1956/01/05 Today's Date: 05/19/2020    Patient in secure enclosure bed upon PT arrival. Patient reports poor sleep last night and declined getting OOB or participating in any therapeutic activity offered at this time due to fatigue. Patient missed 30 min of skilled PT due to fatigue, RN made aware. Will attempt to make-up missed time as able.     Maleke Feria L Marta Bouie PT, DPT  05/19/2020, 8:28 AM

## 2020-05-19 NOTE — Progress Notes (Signed)
Occupational Therapy Weekly Progress Note  Patient Details  Name: Timothy Case MRN: [id number] Date of Birth: May 04, 1955  Beginning of progress report period: May 13, 2020 End of progress report period: May 19, 2020  Today's Date: 05/19/2020 OT Individual Time: 2119-4174 OT Individual Time Calculation (min): 45 min    Patient has met 4 of 4 short term goals.  Pt has made steady progress this reporting period, however is self limiting in participation d/t HA, dizziness, as well as poor insight into deficits. Pt often refusing tx because it is, "beneath him." Pt requires VC for termination of tasks, but is performing ADLs at CGA-S level. Pt does not believe hitting his head has "effected him at all." Pt will need continued OT services to improve safety/awareness of deficits during ADL/IADL performance as well as improve functional cognition  Patient continues to demonstrate the following deficits: muscle weakness, decreased cardiorespiratoy endurance, impaired timing and sequencing, unbalanced muscle activation and decreased coordination, decreased visual perceptual skills, decreased attention, decreased awareness, decreased problem solving, decreased safety awareness and decreased memory and decreased sitting balance, decreased standing balance, decreased postural control and decreased balance strategies and therefore will continue to benefit from skilled OT intervention to enhance overall performance with BADL and iADL.  Patient progressing toward long term goals..  Continue plan of care.  OT Short Term Goals Week 1:  OT Short Term Goal 1 (Week 1): Pt will maintain arousal for >30 min to participate in tx session consistently OT Short Term Goal 1 - Progress (Week 1): Met OT Short Term Goal 2 (Week 1): pt will transfer to toilet wiht MIN A OT Short Term Goal 2 - Progress (Week 1): Met OT Short Term Goal 3 (Week 1): Pt will thread BLE into pants OT Short Term Goal 3 -  Progress (Week 1): Met OT Short Term Goal 4 (Week 1): Pt will sequence oral care with MOD VC OT Short Term Goal 4 - Progress (Week 1): Met Week 2:  OT Short Term Goal 1 (Week 2): Pt will verbalize 1 defict from TBI to demo improved awareness OT Short Term Goal 2 (Week 2): Pt will terminate bathing sequence appropriately with no VC OT Short Term Goal 3 (Week 2): Pt will complete toileting with S OT Short Term Goal 4 (Week 2): Pt will complete dual task with min cuing to demo improved processing  Skilled Therapeutic Interventions/Progress Updates:    1:1. Pt received in bed with increased time to arouse but initially pt pleasant and agreeable to get up OOB and get cup of coffee. Pt intially sits EOB with OT assisting pt power up into sitting, but pt immediately lays back down d/t dizziness. Pt stating, "I cant shower today because I will just be too cold." OT educates that dizziness, HA, light sensitivity, and temp sensitivity is all common after TBI. Pt sits up again in same manner but stays sitting and dizziness subsides. No nystagmus noted. Pt very displeased with enclosure bed and asks OT to lay in it to see how uncomfortable it is. Pt with many complaints throughout session and OT uses therapuetic use of self and empathetic listening to hear pt needs. Pt grooms seated at sink and eventually agreeable to walk to get a cup of coffe. Pt requies CGA for mobilty dt increased lateral sway during walking. Pt unable to identify this as different from before he hit his head. Pt sits on couch in dayroom and sips coffee stating, "im going to get transferred to  [facility]. Otherwise Im going to die here." Reassured pt his needs could be met, however pt does not respond. Pt agreeable to going back to room. Once in enclosure bed pt unable to motor plan or problem solve getting blankets, "right." OT straightens them and retrieves a "better pillow." Pt continues to perseverate on not having a phone and the call light not  being answered. Reviewed with charge RN. Exited session with pt seated in enclosure bed, exit alarm on and call light in reach   Therapy Documentation Precautions:  Precautions Precautions: Fall,Other (comment) Precaution Comments: impaired safety awareness Restrictions Weight Bearing Restrictions: No General:   Vital Signs: Therapy Vitals Temp: 97.9 F (36.6 C) Pulse Rate: 84 Resp: 14 BP: 138/89 Patient Position (if appropriate): Sitting Oxygen Therapy SpO2: 100 % O2 Device: Room Air Pain: Pain Assessment Pain Scale: 0-10 Pain Score: Asleep Faces Pain Scale: No hurt Pain Type: Acute pain Pain Location: Head Pain Descriptors / Indicators: Aching;Dull Pain Frequency: Intermittent Pain Onset: Awakened from sleep Patients Stated Pain Goal: 0 ADL: ADL Grooming: Minimal assistance Where Assessed-Grooming: Edge of bed (sitting balance d/t arousal) Upper Body Bathing: Minimal assistance Where Assessed-Upper Body Bathing: Edge of bed (sitting balance A) Lower Body Bathing: Moderate assistance Where Assessed-Lower Body Bathing: Edge of bed Upper Body Dressing: Minimal assistance Where Assessed-Upper Body Dressing: Edge of bed Lower Body Dressing: Maximal assistance Where Assessed-Lower Body Dressing: Edge of bed Toileting: Maximal assistance Where Assessed-Toileting: Bedside Commode Toilet Transfer: Moderate assistance Toilet Transfer Method: Stand pivot Toilet Transfer Equipment: Systems analyst    Praxis   Exercises:   Other Treatments:     Therapy/Group: Individual Therapy  [name] 05/19/2020, 6:44 AM

## 2020-05-19 NOTE — Progress Notes (Signed)
Occupational Therapy Session Note  Patient Details  Name: Kevonta Phariss MRN: [id number] Date of Birth: 01/10/1956  Today's Date: 05/19/2020 OT Individual Time: 8786-7672 OT Individual Time Calculation (min): 15 min    Short Term Goals: Week 1:  OT Short Term Goal 1 (Week 1): Pt will maintain arousal for >30 min to participate in tx session consistently OT Short Term Goal 1 - Progress (Week 1): Met OT Short Term Goal 2 (Week 1): pt will transfer to toilet wiht MIN A OT Short Term Goal 2 - Progress (Week 1): Met OT Short Term Goal 3 (Week 1): Pt will thread BLE into pants OT Short Term Goal 3 - Progress (Week 1): Met OT Short Term Goal 4 (Week 1): Pt will sequence oral care with MOD VC OT Short Term Goal 4 - Progress (Week 1): Met Week 2:  OT Short Term Goal 1 (Week 2): Pt will verbalize 1 defict from TBI to demo improved awareness OT Short Term Goal 2 (Week 2): Pt will terminate bathing sequence appropriately with no VC OT Short Term Goal 3 (Week 2): Pt will complete toileting with S OT Short Term Goal 4 (Week 2): Pt will complete dual task with min cuing to demo improved processing  Skilled Therapeutic Interventions/Progress Updates:    pt received in bed initially agreeable to shower, however reporting he wants to "go down the hall to the spa room." OT told pt there was a tub, however it was being worked on (tub past the locked doors). Pt sits EOB and requests to wash up EOB.  Pt given 2 hot wash cloths and pt washes face, however pt getting frustrated stating, "Im paying thousands of dollars a day and I cant even take a shower." OT continued to offer the shower in his room, however pt declines stating, "just get out of my room, im getting really mad." OT exits and pt misses 15 min skilled OT.    Pt missed 15 min skilled OT d/t refusal to participate without a medical reason  Therapy Documentation Precautions:  Precautions Precautions: Fall,Other (comment) Precaution  Comments: impaired safety awareness Restrictions Weight Bearing Restrictions: No General:   Vital Signs:   Pain: Pain Assessment Pain Scale: 0-10 Pain Score: 0-No pain ADL: ADL Grooming: Minimal assistance Where Assessed-Grooming: Edge of bed (sitting balance d/t arousal) Upper Body Bathing: Minimal assistance Where Assessed-Upper Body Bathing: Edge of bed (sitting balance A) Lower Body Bathing: Moderate assistance Where Assessed-Lower Body Bathing: Edge of bed Upper Body Dressing: Minimal assistance Where Assessed-Upper Body Dressing: Edge of bed Lower Body Dressing: Maximal assistance Where Assessed-Lower Body Dressing: Edge of bed Toileting: Maximal assistance Where Assessed-Toileting: Bedside Commode Toilet Transfer: Moderate assistance Toilet Transfer Method: Stand pivot Toilet Transfer Equipment: Systems analyst    Praxis   Exercises:   Other Treatments:     Therapy/Group: Individual Therapy  [name] 05/19/2020, 1:00 PM

## 2020-05-19 NOTE — Progress Notes (Signed)
Speech Language Pathology Daily Session Note  Patient Details  Name: Timothy Case MRN: [id number] Date of Birth: April 06, 1956  Today's Date: 05/19/2020 SLP Individual Time: 5397-6734 SLP Individual Time Calculation (min): 28 min  Short Term Goals: Week 1: SLP Short Term Goal 1 (Week 1): Pt will utilize external aids to reorient to place, date, and situation with mod assist multimodal cues. SLP Short Term Goal 2 (Week 1): Pt will complete basic, familiar tasks wtih mod assist multimodal cues. SLP Short Term Goal 3 (Week 1): Pt will return demonstration of at least 2 safety precautions during functional tasks with mod assist multimodal cues. SLP Short Term Goal 4 (Week 1): Pt will sustain his attention to basic, familiar tasks for ~2 minute intervals with mod cues for redirection. SLP Short Term Goal 5 (Week 1): Pt will consume regular textures and thin liquids with min cues for use of universal precautions and minimal overt s/s of aspiration.  Skilled Therapeutic Interventions: Pt was seen for skilled ST targeting cognition. Pt received inside enclosure bed. SLP unzipped bed, but pt preferred to remain reclined. Although he expressed frustration with the enclosure bed itself, he did not attempt to get out today and followed all directions from SLP. He engaged in simple conversation with SLP regarding his family and interests with Mod A cues for topic maintenance in 2 minute intervals. He also required Mod A verbal cues for recall of activities targeted in last session, including making phone calls to his son and wife. He was oriented to date and place without cues. Pt reported that he had no way to call for help inside enclosure bed, therefore targeted awareness and use of call bell with 2 full demonstrations. Pt reported that he feels it "never works", but SLP encouraged him to use it. Near end of session he began to again perseverate on leaving the hospital and became increasingly  agitated. SLP unable to redirect toward functional tasks, as he refused to participate. Therefore, session ended 30 minutes early. However, pt noted to be more calm in general today, for the first ~20 minutes of session. Continue per current plan of care.          Pain Pain Assessment Pain Score: Asleep Faces Pain Scale: No hurt  Therapy/Group: Individual Therapy  Little Ishikawa 05/19/2020, 7:19 AM

## 2020-05-19 NOTE — Progress Notes (Signed)
Physical Therapy Note  Patient Details  Name: Timothy Case MRN: [id number] Date of Birth: 12/06/1955 Today's Date: 05/19/2020    Patient asleep upon PT entry. Patient would not wake up to verbal or tactile cues, but breathing evenly. No sleep chart documented last night, charge nurse made aware. Patient missed 30 min of skilled PT due to fatigue/lethargy, RN made aware. Will attempt to make-up missed time as able.      Jaanvi Fizer L Philana Younis PT, DPT  05/19/2020, 1033 AM

## 2020-05-19 NOTE — Progress Notes (Signed)
[place] PHYSICAL MEDICINE & REHABILITATION PROGRESS NOTE   Subjective/Complaints: Patient seen laying in bed this morning.  He states he did not sleep well overnight and wants to be discharged to [facility].  No reported issues overnight.  He peaks at length about his entire past medical history..   ROS: Limited due to cognition  Objective:   No results found. Recent Labs    05/19/20 0548  WBC 9.3  HGB 15.3  HCT 43.8  PLT 452*   Recent Labs    05/19/20 0548  NA 132*  K 4.4  CL 93*  CO2 27  GLUCOSE 117*  BUN 26*  CREATININE 1.19  CALCIUM 9.3    Intake/Output Summary (Last 24 hours) at 05/19/2020 1127 Last data filed at 05/19/2020 0700 Gross per 24 hour  Intake 560 ml  Output --  Net 560 ml        Physical Exam: Vital Signs Blood pressure 138/89, pulse 84, temperature 97.9 F (36.6 C), resp. rate 14, height 5' 9.02" (1.753 m), weight 99.2 kg, SpO2 100 %. Constitutional: No distress . Vital signs reviewed. HENT: Normocephalic.  Atraumatic. Eyes: EOMI. No discharge. Cardiovascular: No JVD.  RRR. Respiratory: Normal effort.  No stridor.  Bilateral clear to auscultation. GI: Non-distended.  BS +. Skin: Warm and dry.  Intact. Psych: Frustrated.  Normal behavior. Musc: No edema in extremities.  No tenderness in extremities. Neurologic alert and oriented x3  Motor: >4/5  Assessment/Plan: 1. Functional deficits which require 3+ hours per day of interdisciplinary therapy in a comprehensive inpatient rehab setting.  Physiatrist is providing close team supervision and 24 hour management of active medical problems listed below.  Physiatrist and rehab team continue to assess barriers to discharge/monitor patient progress toward functional and medical goals  Care Tool:  Bathing    Body parts bathed by patient: Right arm,Left arm,Chest,Abdomen,Right upper leg,Left upper leg,Face   Body parts bathed by helper: Front perineal area,Buttocks,Right lower leg,Left lower  leg     Bathing assist Assist Level: Moderate Assistance - Patient 50 - 74%     Upper Body Dressing/Undressing Upper body dressing   What is the patient wearing?: Hospital gown only,Pull over shirt    Upper body assist Assist Level: Minimal Assistance - Patient > 75%    Lower Body Dressing/Undressing Lower body dressing      What is the patient wearing?: Incontinence brief     Lower body assist Assist for lower body dressing: Minimal Assistance - Patient > 75%     Toileting Toileting    Toileting assist Assist for toileting: Maximal Assistance - Patient 25 - 49% Assistive Device Comment: Urinal   Transfers Chair/bed transfer  Transfers assist     Chair/bed transfer assist level: Supervision/Verbal cueing     Locomotion Ambulation   Ambulation assist      Assist level: Contact Guard/Touching assist Assistive device: No Device Max distance: >250 ft   Walk 10 feet activity   Assist     Assist level: Contact Guard/Touching assist Assistive device: No Device   Walk 50 feet activity   Assist    Assist level: Contact Guard/Touching assist Assistive device: No Device    Walk 150 feet activity   Assist    Assist level: Contact Guard/Touching assist Assistive device: No Device    Walk 10 feet on uneven surface  activity   Assist Walk 10 feet on uneven surfaces activity did not occur: Safety/medical concerns         Wheelchair  Assist Will patient use wheelchair at discharge?: No             Wheelchair 50 feet with 2 turns activity    Assist            Wheelchair 150 feet activity     Assist          Blood pressure 138/89, pulse 84, temperature 97.9 F (36.6 C), resp. rate 14, height 5' 9.02" (1.753 m), weight 99.2 kg, SpO2 100 %.    Medical Problem List and Plan: 1.TBI/SDH/nondisplaced right occipital bone fracturesecondary tounwitnessed fall 05/04/2020  Continue CIR 2.  Antithrombotics: -DVT/anticoagulation:Subcutaneous heparin 05/08/2020 -antiplatelet therapy: N/A 3. Pain Management:Oxycodone as needed  Appears controlled on 2/4 4. Mood/sleep:Melatonin 5 mg nightly as needed. -continue sleep chart -antipsychotic agents: seroquel  2/3 continue seroquel 100mg  qhs with 50mg  scheduled in morning   -seroquel 50mg  prn for moderate agitation   -propranolol 40mg  TID---change to XR form   -IM Ativan for severe agitation   -consider re-trial of ritalin for attention   -continue enclosure bed for safety, behavior plan  Continue canopy bed for safety 5. Neuropsych: This patientis notcapable of making decisions on hisown behalf. 6. Skin/Wound Care:Routine skin checks 7. Fluids/Electrolytes/Nutrition: -encourage PO intake 8. Seizure prophylaxis. Keppra 1000 mg mg twice daily  Decreased to 750mg  bid on 2/3, continue to wean 9. Atrial fibrillation with RVR. Intravenous Cardizem discontinued. dc Coreg 25 mg as below, start propranolol for behavioral benefits Controlled on 2/4 10. Hypertension.   Vitals:   05/18/20 2028 05/19/20 0638  BP: 113/83 138/89  Pulse: 93 84  Resp: 16 14  Temp: 98.5 F (36.9 C) 97.9 F (36.6 C)  SpO2: 100% 100%    2/3 Continue propranolol in place of coreg--change to XR form  continue norvasc  lisinopril 10mg   Controlled on 2/4 11. Decreased nutritional storage/Dysphagia:  Diet has been advanced to regular.  12. History of alcohol use. Monitor for any signs of withdrawal 13. Constipation. MiraLAX as directed. Adjust bowel program as needed.  Bowel meds increased on 2/4 14. History recent Covid March and then again December 2021. Rapid antigen test was negative. It was felt that Covid positive PCR test indicated dead virus shedding rather than active infection and isolation discontinued. 15.  Hyponatremia  Sodium 132 on 2/4, plan repeat  labs  Continue to monitor  LOS: 7 days A FACE TO FACE EVALUATION WAS PERFORMED  Timothy Case 07/16/20 05/19/2020, 11:27 AM

## 2020-05-20 DIAGNOSIS — I480 Paroxysmal atrial fibrillation: Secondary | ICD-10-CM

## 2020-05-20 NOTE — Progress Notes (Signed)
[place] PHYSICAL MEDICINE & REHABILITATION PROGRESS NOTE   Subjective/Complaints: Patient seen laying in bed this AM.  He states he slept well overnight.  He slept fairly well per sleep chart.  He wants to rest more. He is more calm today.   ROS: Limited due to cognition, but appears to deny CP, SOB, N/V/D.  Objective:   No results found. Recent Labs    05/19/20 0548  WBC 9.3  HGB 15.3  HCT 43.8  PLT 452*   Recent Labs    05/19/20 0548  NA 132*  K 4.4  CL 93*  CO2 27  GLUCOSE 117*  BUN 26*  CREATININE 1.19  CALCIUM 9.3    Intake/Output Summary (Last 24 hours) at 05/20/2020 1343 Last data filed at 05/20/2020 0933 Gross per 24 hour  Intake 120 ml  Output 125 ml  Net -5 ml        Physical Exam: Vital Signs Blood pressure 114/89, pulse 93, temperature 98.1 F (36.7 C), resp. rate 20, height 5' 9.02" (1.753 m), weight 99.4 kg, SpO2 96 %. Constitutional: No distress . Vital signs reviewed. HENT: Normocephalic.  Atraumatic. Eyes: EOMI. No discharge. Cardiovascular: No JVD.  RRR. Respiratory: Normal effort.  No stridor.  Bilateral clear to auscultation. GI: Non-distended.  BS +. Skin: Warm and dry.  Intact. Psych: Normal mood.  Normal behavior. Musc: No edema in extremities.  No tenderness in extremities. Neurologic Alert and oriented x3  Motor: >4/5, unchanged  Assessment/Plan: 1. Functional deficits which require 3+ hours per day of interdisciplinary therapy in a comprehensive inpatient rehab setting.  Physiatrist is providing close team supervision and 24 hour management of active medical problems listed below.  Physiatrist and rehab team continue to assess barriers to discharge/monitor patient progress toward functional and medical goals  Care Tool:  Bathing    Body parts bathed by patient: Right arm,Left arm,Chest,Abdomen,Right upper leg,Left upper leg,Face   Body parts bathed by helper: Front perineal area,Buttocks,Right lower leg,Left lower leg      Bathing assist Assist Level: Moderate Assistance - Patient 50 - 74%     Upper Body Dressing/Undressing Upper body dressing   What is the patient wearing?: Hospital gown only,Pull over shirt    Upper body assist Assist Level: Minimal Assistance - Patient > 75%    Lower Body Dressing/Undressing Lower body dressing      What is the patient wearing?: Incontinence brief     Lower body assist Assist for lower body dressing: Minimal Assistance - Patient > 75%     Toileting Toileting    Toileting assist Assist for toileting: Maximal Assistance - Patient 25 - 49% Assistive Device Comment: Urinal   Transfers Chair/bed transfer  Transfers assist     Chair/bed transfer assist level: Supervision/Verbal cueing     Locomotion Ambulation   Ambulation assist      Assist level: Contact Guard/Touching assist Assistive device: No Device Max distance: >250 ft   Walk 10 feet activity   Assist     Assist level: Contact Guard/Touching assist Assistive device: No Device   Walk 50 feet activity   Assist    Assist level: Contact Guard/Touching assist Assistive device: No Device    Walk 150 feet activity   Assist    Assist level: Contact Guard/Touching assist Assistive device: No Device    Walk 10 feet on uneven surface  activity   Assist Walk 10 feet on uneven surfaces activity did not occur: Safety/medical concerns  Wheelchair     Assist Will patient use wheelchair at discharge?: No             Wheelchair 50 feet with 2 turns activity    Assist            Wheelchair 150 feet activity     Assist          Blood pressure 114/89, pulse 93, temperature 98.1 F (36.7 C), resp. rate 20, height 5' 9.02" (1.753 m), weight 99.4 kg, SpO2 96 %.    Medical Problem List and Plan: 1.TBI/SDH/nondisplaced right occipital bone fracturesecondary tounwitnessed fall 05/04/2020  Continue CIR 2.  Antithrombotics: -DVT/anticoagulation:Subcutaneous heparin 05/08/2020 -antiplatelet therapy: N/A 3. Pain Management:Oxycodone as needed  Appears controlled on 2/5 4. Mood/sleep:Melatonin 5 mg nightly as needed. -continue sleep chart -antipsychotic agents: seroquel  2/3 continue seroquel 100mg  qhs with 50mg  scheduled in morning   -seroquel 50mg  prn for moderate agitation   -propranolol 40mg  TID---change to XR form   -IM Ativan for severe agitation   -consider re-trial of ritalin for attention   -continue enclosure bed for safety, behavior plan  Continue canopy bed for safety  Stable on 2/5, no med changes today 5. Neuropsych: This patientis notcapable of making decisions on hisown behalf. 6. Skin/Wound Care:Routine skin checks 7. Fluids/Electrolytes/Nutrition: -encourage PO intake 8. Seizure prophylaxis. Keppra 1000 mg mg twice daily  Decreased to 750mg  bid on 2/3, continue to wean 9. Atrial fibrillation with RVR. Intravenous Cardizem discontinued. dc Coreg 25 mg as below, start propranolol for behavioral benefits Controlled on 2/5 10. Hypertension.   Vitals:   05/19/20 2000 05/20/20 0500  BP: 111/76 114/89  Pulse: 86 93  Resp: 20 20  Temp: 97.8 F (36.6 C) 98.1 F (36.7 C)  SpO2: 100% 96%    2/3 Continue propranolol in place of coreg--change to XR form  continue norvasc  lisinopril 10mg   Controlled on 2/5 11. Decreased nutritional storage/Dysphagia:  Diet has been advanced to regular.  12. History of alcohol use. Monitor for any signs of withdrawal 13. Constipation. MiraLAX as directed. Adjust bowel program as needed.  Bowel meds increased on 2/4  Improving 14. History recent Covid March and then again December 2021. Rapid antigen test was negative. It was felt that Covid positive PCR test indicated dead virus shedding rather than active infection and isolation discontinued. 15.   Hyponatremia  Sodium 132 on 2/4, labs ordered for Monday  Continue to monitor  LOS: 8 days A FACE TO FACE EVALUATION WAS PERFORMED  Demetres Prochnow 07/17/20 05/20/2020, 1:43 PM

## 2020-05-20 NOTE — Progress Notes (Signed)
Physical Therapy Session Note  Patient Details  Name: Timothy Case MRN: [id number] Date of Birth: 1956-02-20  Today's Date: 05/20/2020 PT Individual Time: 0800-0900 PT Individual Time Calculation (min): 60 min   Short Term Goals: Week 1:  PT Short Term Goal 1 (Week 1): Pt will perform supine<>sit with supervision PT Short Term Goal 2 (Week 1): Pt will perform sit<>stands using LRAD with min assist PT Short Term Goal 3 (Week 1): Pt will perform bed<>chair transfers using LRAD with min assist PT Short Term Goal 4 (Week 1): Pt will ambulate at least 151f using LRAD with min assist PT Short Term Goal 5 (Week 1): Pt will ascend/descend 4 steps using B HRs with min assist Week 2:    Week 3:     Skilled Therapeutic Interventions/Progress Updates:    PAIN states headache 9/10 but when asked later in session denies pain.  Pt initially resting in sidelying, easily awakened.  Pt complaining about enclosure bed.  supine to sit w/supervision.  Sit to stand w/cga and short distance gait to commode w/cga.  Pt continent of bowel and bladder and independent w/hygiene. Short distance gait to sink, washes hands w/supervision, returns to sitting on edge of bed w/supervisoin. Pt perseverating on 1) calling wife  2) "getting out of here" (hospital) 3) leaving rehab center 4)tolerance for enclosure bed 5)idea that nothing is wrong with him now   Pt requires constant redirection during session due to tendency toward agitation due to above perseverations.  Very resistive to education regarding TBI diagnosis. Pt denies any residual cognitive issues, refuses cognitive tasks citing irrelevancy.  Gait >3055fw/HHA of 1, 15073f/supervision.  Wide based gait but not certain if this is premorbid.  No balance loss during gait, mildly slowed cadence is preferred gait speed even when mildly agitated. Was able to persuade pt to perform limited cognitive activity: Math challenges - 3 digit addition -  performs without difficulty                               2 digit multiplication - pt fails w/this, becomes agitated, redirected. Internet navigation - pt discussing his yacht with therapist and stated he wanted to show me this.  Pt provided with internet access and after 2 attempts he was able to use google and find a website with photos of both himself and his boat. attmpted to educate pt on need to assess balance/cognition further before his complaints could be addressed w/team.  Agitation becoming more frequent at this point, pt states "when I first met you I really liked you, but you are slowly working your way down my list".   Gait [id number] supervision back to room.  Pt initially carrying blanket/dragging floor but hands to therapist when asked to do so, unaware of tripping hazard.   Patient turns, sits, sit to supine w/suprevision and raises voice "go ahead and zip it up! Let everyone see you! Its all anyone cares about is locking me up!"  Pt distracted with humor and bed secured.  Pt clearly fatigued.  When presented w/cognitive challenges, pt becomes agitated and perseverates on how each is not applicable to him despite given functional examples.  Constantly expresses how his lifestyle/access to assistance/technology/financial wealth make each challenging task "ridiculous".  Pt visibily agitated at times/ becomes flushed but able to redirect throughout session.    Therapy Documentation Precautions:  Precautions Precautions: Fall,Other (comment) Precaution Comments: impaired safety awareness  Restrictions Weight Bearing Restrictions: No    Therapy/Group: Individual Therapy  Callie Fielding, [place] 05/20/2020, 5:33 PM

## 2020-05-20 NOTE — Progress Notes (Incomplete)
Occupational Therapy Session Note  Patient Details  Name: Timothy Case MRN: [id number] Date of Birth: [birth year]  {CHL IP REHAB OT TIME CALCULATIONS:304400400}   Short Term Goals: {OT ESP:2330076}  Skilled Therapeutic Interventions/Progress Updates:      Therapy Documentation Precautions:  Precautions Precautions: Fall,Other (comment) Precaution Comments: impaired safety awareness Restrictions Weight Bearing Restrictions: No General:   Vital Signs: Therapy Vitals Temp: 98.8 F (37.1 C) Pulse Rate: 97 Resp: 19 BP: 94/69 Patient Position (if appropriate): Lying Oxygen Therapy SpO2: 98 % O2 Device: Room Air Pain: Pain Assessment Pain Scale: 0-10 Pain Score: 7  Pain Type: Acute pain Pain Location: Head Pain Orientation: Anterior Pain Descriptors / Indicators: Throbbing Pain Frequency: Intermittent Pain Onset: On-going Patients Stated Pain Goal: 0 Pain Intervention(s): Medication (See eMAR) ADL: ADL Grooming: Minimal assistance Where Assessed-Grooming: Edge of bed (sitting balance d/t arousal) Upper Body Bathing: Minimal assistance Where Assessed-Upper Body Bathing: Edge of bed (sitting balance A) Lower Body Bathing: Moderate assistance Where Assessed-Lower Body Bathing: Edge of bed Upper Body Dressing: Minimal assistance Where Assessed-Upper Body Dressing: Edge of bed Lower Body Dressing: Maximal assistance Where Assessed-Lower Body Dressing: Edge of bed Toileting: Maximal assistance Where Assessed-Toileting: Bedside Commode Toilet Transfer: Moderate assistance Toilet Transfer Method: Stand pivot Toilet Transfer Equipment: Grab bars Vision   Perception    Praxis   Exercises:   Other Treatments:     Therapy/Group: {Therapy/Group:[id number]}  Shon Hale 05/20/2020, 5:01 PM
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
## 2020-05-21 MED ORDER — FLUTICASONE PROPIONATE 50 MCG/ACT NA SUSP
1.0000 | Freq: Two times a day (BID) | NASAL | Status: DC | PRN
  Administered 2020-05-21 – 2020-05-26 (×3): 1 via NASAL
  Filled 2020-05-21: qty 16

## 2020-05-21 NOTE — Plan of Care (Signed)
  Problem: Consults Goal: RH BRAIN INJURY PATIENT EDUCATION Description: Description: See Patient Education module for eduction specifics Outcome: Progressing Goal: Skin Care Protocol Initiated - if Braden Score 18 or less Description: If consults are not indicated, leave blank or document N/A Outcome: Progressing Goal: Nutrition Consult-if indicated Outcome: Progressing   Problem: RH BOWEL ELIMINATION Goal: RH STG MANAGE BOWEL WITH ASSISTANCE Description: STG Manage Bowel with mod I Assistance. Outcome: Progressing Goal: RH STG MANAGE BOWEL W/MEDICATION W/ASSISTANCE Description: STG Manage Bowel with Medication with mod I Assistance. Outcome: Progressing   Problem: RH BLADDER ELIMINATION Goal: RH STG MANAGE BLADDER WITH ASSISTANCE Description: STG Manage Bladder With mod I Assistance Outcome: Progressing   Problem: RH SKIN INTEGRITY Goal: RH STG SKIN FREE OF INFECTION/BREAKDOWN Outcome: Progressing Goal: RH STG MAINTAIN SKIN INTEGRITY WITH ASSISTANCE Description: STG Maintain Skin Integrity With mod I Assistance. Outcome: Progressing   Problem: RH SAFETY Goal: RH STG ADHERE TO SAFETY PRECAUTIONS W/ASSISTANCE/DEVICE Description: STG Adhere to Safety Precautions With min Assistance/Device. Outcome: Progressing   Problem: RH COGNITION-NURSING Goal: RH STG USES MEMORY AIDS/STRATEGIES W/ASSIST TO PROBLEM SOLVE Description: STG Uses Memory Aids/Strategies With min Assistance to Problem Solve. Outcome: Progressing Goal: RH STG ANTICIPATES NEEDS/CALLS FOR ASSIST W/ASSIST/CUES Description: STG Anticipates Needs/Calls for Assist With min Assistance/Cues. Outcome: Progressing   Problem: RH PAIN MANAGEMENT Goal: RH STG PAIN MANAGED AT OR BELOW PT'S PAIN GOAL Description: Pain scale <4/10 Outcome: Progressing   Problem: RH KNOWLEDGE DEFICIT BRAIN INJURY Goal: RH STG INCREASE KNOWLEDGE OF SELF CARE AFTER BRAIN INJURY Outcome: Progressing   Problem: Safety: Goal: Non-violent  Restraint(s) Outcome: Progressing
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
## 2020-05-21 NOTE — Progress Notes (Signed)
Physical Therapy Weekly Progress Note  Patient Details  Name: Timothy Case MRN: [id number] Date of Birth: May 09, 1955  Beginning of progress report period: May 13, 2020 End of progress report period: May 21, 2020  Today's Date: 05/21/2020  Patient has met 4 of 5 short term goals.  Patient making good functional gains this week. Participation limited by lethargy and agitation. Presents with very poor incite into deficits, decreased safety awareness, poor frustration tolerance leading to frequent agitation with staff and family, and decreased attention decreasing safety with all mobility.   Patient continues to demonstrate the following deficits decreased cardiorespiratoy endurance, decreased attention, decreased awareness, decreased problem solving, decreased safety awareness and decreased memory and decreased standing balance, decreased postural control and decreased balance strategies and therefore will continue to benefit from skilled PT intervention to increase functional independence with mobility.  Patient progressing toward long term goals..  Continue plan of care.  PT Short Term Goals Week 1:  PT Short Term Goal 1 (Week 1): Pt will perform supine<>sit with supervision PT Short Term Goal 1 - Progress (Week 1): Met PT Short Term Goal 2 (Week 1): Pt will perform sit<>stands using LRAD with min assist PT Short Term Goal 2 - Progress (Week 1): Met PT Short Term Goal 3 (Week 1): Pt will perform bed<>chair transfers using LRAD with min assist PT Short Term Goal 3 - Progress (Week 1): Met PT Short Term Goal 4 (Week 1): Pt will ambulate at least 121f using LRAD with min assist PT Short Term Goal 4 - Progress (Week 1): Met PT Short Term Goal 5 (Week 1): Pt will ascend/descend 4 steps using B HRs with min assist PT Short Term Goal 5 - Progress (Week 1): Progressing toward goal Week 2:  PT Short Term Goal 1 (Week 2): Patient will participate in standardized balance  assessment with min cues for attention and behavior managment. PT Short Term Goal 2 (Week 2): Pt will ascend/descend 4 steps using B HRs with CGA. PT Short Term Goal 3 (Week 2): Patient will participate in >75% of his PT sessions for the full scheduled time. PT Short Term Goal 4 (Week 2): Patient will ambulate >150 ft with supervision for safety.   Therapy Documentation Precautions:  Precautions Precautions: Fall,Other (comment) Precaution Comments: impaired safety awareness Restrictions Weight Bearing Restrictions: No  Therapy/Group: Individual Therapy  Jovannie Ulibarri L Lashun Mccants PT, DPT  05/21/2020, 4:15 PM
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
## 2020-05-22 LAB — BASIC METABOLIC PANEL
Anion gap: 13 (ref 5–15)
BUN: 23 mg/dL (ref 8–23)
CO2: 24 mmol/L (ref 22–32)
Calcium: 8.9 mg/dL (ref 8.9–10.3)
Chloride: 96 mmol/L — ABNORMAL LOW (ref 98–111)
Creatinine, Ser: 1.14 mg/dL (ref 0.61–1.24)
GFR, Estimated: >60 mL/min (ref >60)
Glucose, Bld: 122 mg/dL — ABNORMAL HIGH (ref 70–99)
Potassium: 4.8 mmol/L (ref 3.5–5.1)
Sodium: 133 mmol/L — ABNORMAL LOW (ref 135–145)

## 2020-05-22 MED ORDER — QUETIAPINE FUMARATE 25 MG PO TABS
25.0000 mg | ORAL_TABLET | Freq: Every day | ORAL | Status: DC
  Administered 2020-05-24 – 2020-05-30 (×7): 25 mg via ORAL
  Filled 2020-05-22 (×9): qty 1

## 2020-05-22 MED ORDER — ACETAMINOPHEN 650 MG RE SUPP: 650.0000 mg | Freq: Four times a day (QID) | RECTAL | Status: DC | PRN

## 2020-05-22 MED ORDER — LEVETIRACETAM 500 MG PO TABS
500.0000 mg | ORAL_TABLET | Freq: Two times a day (BID) | ORAL | Status: DC
  Administered 2020-05-22 – 2020-05-26 (×8): 500 mg via ORAL
  Filled 2020-05-22 (×8): qty 1

## 2020-05-22 MED ORDER — ACETAMINOPHEN 325 MG PO TABS
650.0000 mg | ORAL_TABLET | ORAL | Status: DC | PRN
  Administered 2020-05-22 – 2020-05-30 (×15): 650 mg via ORAL
  Filled 2020-05-22 (×20): qty 2

## 2020-05-22 NOTE — Progress Notes (Signed)
Physical Therapy Note  Patient Details  Name: Timothy Case MRN: [id number] Date of Birth: 1955-07-19 Today's Date: 05/22/2020    Patient in bed asleep upon PT arrival. Patient declined therapy due to fatigue. Asked PT to return at a later time. Will adjust schedule for later therapy start time to reduce missed time in the mornings. Patient missed 30 min of skilled PT due to fatigue, RN made aware. Will attempt to make-up missed time as able.     Genella Bas L Kali Ambler PT, DPT  05/22/2020, 8:17 AM
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
## 2020-05-22 NOTE — Progress Notes (Signed)
[place] PHYSICAL MEDICINE & REHABILITATION PROGRESS NOTE   Subjective/Complaints: Pt didn't sleep well. Hates bed which bothers his low back (no support). Has always been irritated by enclosure bed. Said he needed to get up to urinate. Told PT this morning that he feels tired after receiving morning meds. Didn't want to do PT this am.   ROS: Limited due to cognitive/behavioral   Objective:   No results found. No results for input(s): WBC, HGB, HCT, PLT in the last 72 hours. No results for input(s): NA, K, CL, CO2, GLUCOSE, BUN, CREATININE, CALCIUM in the last 72 hours.  Intake/Output Summary (Last 24 hours) at 05/22/2020 0954 Last data filed at 05/22/2020 0254 Gross per 24 hour  Intake 560 ml  Output --  Net 560 ml        Physical Exam: Vital Signs Blood pressure 107/85, pulse 65, temperature 98.3 F (36.8 C), resp. rate 18, height 5' 9.02" (1.753 m), weight 99.4 kg, SpO2 96 %. Constitutional: No distress . Vital signs reviewed. HEENT: EOMI, oral membranes moist Neck: supple Cardiovascular: RRR without murmur. No JVD    Respiratory/Chest: CTA Bilaterally without wheezes or rales. Normal effort    GI/Abdomen: BS +, non-tender, non-distended Ext: no clubbing, cyanosis, or edema Psych:irritable but more redirectable  Musc: No edema in extremities.  No tenderness in extremities. Neurologic Alert and oriented x3  Motor: 4+ to 5/5. Reasonable standing balance. Transferred without assistance from EOB. Urinated while standing into toilet without physical assistance  Assessment/Plan: 1. Functional deficits which require 3+ hours per day of interdisciplinary therapy in a comprehensive inpatient rehab setting.  Physiatrist is providing close team supervision and 24 hour management of active medical problems listed below.  Physiatrist and rehab team continue to assess barriers to discharge/monitor patient progress toward functional and medical goals  Care Tool:  Bathing     Body parts bathed by patient: Right arm,Left arm,Chest,Abdomen,Right upper leg,Left upper leg,Face   Body parts bathed by helper: Front perineal area,Buttocks,Right lower leg,Left lower leg     Bathing assist Assist Level: Moderate Assistance - Patient 50 - 74%     Upper Body Dressing/Undressing Upper body dressing   What is the patient wearing?: Hospital gown only,Pull over shirt    Upper body assist Assist Level: Minimal Assistance - Patient > 75%    Lower Body Dressing/Undressing Lower body dressing      What is the patient wearing?: Incontinence brief     Lower body assist Assist for lower body dressing: Minimal Assistance - Patient > 75%     Toileting Toileting    Toileting assist Assist for toileting: Maximal Assistance - Patient 25 - 49% Assistive Device Comment: Urinal   Transfers Chair/bed transfer  Transfers assist     Chair/bed transfer assist level: Supervision/Verbal cueing     Locomotion Ambulation   Ambulation assist      Assist level: Contact Guard/Touching assist Assistive device: No Device Max distance: >125 ft   Walk 10 feet activity   Assist     Assist level: Contact Guard/Touching assist Assistive device: No Device   Walk 50 feet activity   Assist    Assist level: Contact Guard/Touching assist Assistive device: No Device    Walk 150 feet activity   Assist    Assist level: Contact Guard/Touching assist Assistive device: No Device    Walk 10 feet on uneven surface  activity   Assist Walk 10 feet on uneven surfaces activity did not occur: Safety/medical concerns  Wheelchair     Assist Will patient use wheelchair at discharge?: No             Wheelchair 50 feet with 2 turns activity    Assist            Wheelchair 150 feet activity     Assist          Blood pressure 107/85, pulse 65, temperature 98.3 F (36.8 C), resp. rate 18, height 5' 9.02" (1.753 m), weight 99.4  kg, SpO2 96 %.    Medical Problem List and Plan: 1.TBI/SDH/nondisplaced right occipital bone fracturesecondary tounwitnessed fall 05/04/2020  Continue CIR PT, OT, SLP   -ELOS 06/02/20 2. Antithrombotics: -DVT/anticoagulation:Subcutaneous heparin 05/08/2020 -antiplatelet therapy: N/A 3. Pain Management:Oxycodone as needed  Appears controlled on 2/6 4. Mood/sleep:Melatonin 5 mg nightly as needed. -continue sleep chart -antipsychotic agents: seroquel  2/3 continue seroquel 100mg  qhs with 50mg  scheduled in morning   -seroquel 50mg  prn for moderate agitation   -propranolol 40mg  TID---change to XR form   -IM Ativan for severe agitation   -consider re-trial of ritalin for attention   -maintain behavioral plan  Improving behavior   2/7-d/w re: moving to low bed   -will reduce AM seroquel to 25mg  5. Neuropsych: This patientis notcapable of making decisions on hisown behalf. 6. Skin/Wound Care:Routine skin checks 7. Fluids/Electrolytes/Nutrition: -encourage PO intake 8. Seizure prophylaxis. Keppra 1000 mg mg twice daily  Decreased to 750mg  bid on 2/3, reduce to 500mg  bid with PM dose 2/7 9. Atrial fibrillation with RVR. Intravenous Cardizem discontinued. dc Coreg 25 mg as below, start propranolol for behavioral benefits Controlled on 2/7 10. Hypertension.   Vitals:   05/21/20 1956 05/22/20 0517  BP: 123/83 107/85  Pulse: 87 65  Resp: 18 18  Temp: 97.7 F (36.5 C) 98.3 F (36.8 C)  SpO2: 99% 96%    2/3 Continue propranolol in place of coreg--change to XR form  continue norvasc  lisinopril 10mg  added  Controlled on 2/7 11. Decreased nutritional storage/Dysphagia:  Diet has been advanced to regular.  12. History of alcohol use. Monitor for any signs of withdrawal 13. Constipation. MiraLAX as directed. Adjust bowel program as needed.  Bowel meds increased on 2/4  Improving 14. History recent  Covid March and then again December 2021. Rapid antigen test was negative. It was felt that Covid positive PCR test indicated dead virus shedding rather than active infection and isolation discontinued. 15.  Hyponatremia  Sodium 132 on 2/4, labs pending  Continue to monitor  LOS: 10 days A FACE TO FACE EVALUATION WAS PERFORMED  4/7 05/22/2020, 9:54 AM

## 2020-05-22 NOTE — Progress Notes (Signed)
Occupational Therapy Session Note  Patient Details  Name: Timothy Case MRN: [id number] Date of Birth: 1956/03/11  Today's Date: 05/22/2020 OT Individual Time: 1110-1125 OT Individual Time Calculation (min): 15 min  and Today's Date: 05/22/2020 OT Missed Time: 45 Minutes Missed Time Reason: Patient unwilling/refused to participate without medical reason   Short Term Goals: Week 2:  OT Short Term Goal 1 (Week 2): Pt will verbalize 1 defict from TBI to demo improved awareness OT Short Term Goal 2 (Week 2): Pt will terminate bathing sequence appropriately with no VC OT Short Term Goal 3 (Week 2): Pt will complete toileting with S OT Short Term Goal 4 (Week 2): Pt will complete dual task with min cuing to demo improved processing  Skilled Therapeutic Interventions/Progress Updates:    Pt received supine in enclosure bed. He was perseverating on speaking with a police officer/attorney throughout the session. He was briefly able to be redirected to ADLs. He transferred out of bed and into the bathroom with supervision. He was was initially agreeable to take shower and then after walking in the bathroom stated "nevermind" and declined. He completed oral care at the sink with supervision. Nursing staff entered to remove enclosure bed and bring in a low bed in hopes of decreasing agitated associated with being "trapped". Pt's agitation with OT was increasing and he was guided back to bed. He was left with low bed in lowest position, safety fall mats down, and bed alarm set. 45 min missed, will attempt to make up as schedule allows.   Therapy Documentation Precautions:  Precautions Precautions: Fall,Other (comment) Precaution Comments: impaired safety awareness Restrictions Weight Bearing Restrictions: No   Therapy/Group: Individual Therapy  Crissie Reese 05/22/2020, 6:47 AM

## 2020-05-22 NOTE — Progress Notes (Signed)
Occupational Therapy Session Note  Patient Details  Name: Timothy Case MRN: [id number] Date of Birth: 1955-12-31  Today's Date: 05/22/2020 OT Individual Time: 1400-1425 OT Individual Time Calculation (min): 25 min    Short Term Goals: Week 2:  OT Short Term Goal 1 (Week 2): Pt will verbalize 1 defict from TBI to demo improved awareness OT Short Term Goal 2 (Week 2): Pt will terminate bathing sequence appropriately with no VC OT Short Term Goal 3 (Week 2): Pt will complete toileting with S OT Short Term Goal 4 (Week 2): Pt will complete dual task with min cuing to demo improved processing  Skilled Therapeutic Interventions/Progress Updates:    Patient in bed, alert, impulsive but states that "I'll do whatever you want".   He declined shower when offered.  He notes pain in lower back, neck and head ache.  When offered stretching activities he declines.  Ambulation on unit with CS.  He agrees to balance and exercises which he completed quickly but without loss of balance.  He agrees that his frustration tolerance is low but demonstrates difficulty with reasoning at this time.  He returned to bed at close of session.  Bed alarm set, telesitter in place and nursing present.  Call bell in reach.    Therapy Documentation Precautions:  Precautions Precautions: Fall,Other (comment) Precaution Comments: impaired safety awareness Restrictions Weight Bearing Restrictions: No   Therapy/Group: Individual Therapy  Barrie Lyme 05/22/2020, 7:51 AM

## 2020-05-23 DIAGNOSIS — S069X9S Unspecified intracranial injury with loss of consciousness of unspecified duration, sequela: Secondary | ICD-10-CM

## 2020-05-23 MED ORDER — OXYCODONE HCL 5 MG PO TABS
5.0000 mg | ORAL_TABLET | Freq: Four times a day (QID) | ORAL | Status: DC | PRN
  Administered 2020-05-25: 5 mg via ORAL
  Filled 2020-05-23: qty 1

## 2020-05-23 NOTE — Progress Notes (Signed)
Physical Therapy Session Note  Patient Details  Name: Timothy Case MRN: [id number] Date of Birth: [birth year]  Today's Date: 05/23/2020 PT Individual Time: 4650-3546 PT Individual Time Calculation (min): 29 min   and  Today's Date: 05/23/2020 PT Missed Time: 16 Minutes Missed Time Reason: Patient fatigue;Patient unwilling to participate  Short Term Goals: Week 2:  PT Short Term Goal 1 (Week 2): Patient will participate in standardized balance assessment with min cues for attention and behavior managment. PT Short Term Goal 2 (Week 2): Pt will ascend/descend 4 steps using B HRs with CGA. PT Short Term Goal 3 (Week 2): Patient will participate in >75% of his PT sessions for the full scheduled time. PT Short Term Goal 4 (Week 2): Patient will ambulate >150 ft with supervision for safety.  Skilled Therapeutic Interventions/Progress Updates:    Pt received L sidelying in bed resting and upon therapist entry pt reluctantly agreeable to therapy session. Sidelying>sitting EOB independently. Sit<>stands during session independently. Ambulated >334ft throughout session, no AD, with supervision for safety - demos wide based gait throughout. Pt participated in Functional Gait Assessment (FGA) with score of 17/30 demonstrating high fall risk with pt demonstrating mild impairments with most tasks and moderate to severe impairments with eyes close and narrow based gait (low fall risk 25-28, medium fall risk 19-24, and high fall risk <19). Participated in ambulating 1,039ft without AD and with close supervision for safety - (age matched norm is 1670ft) demonstrating decreased endurance and decreased gait speed. Throughout session pt is reluctant to participate in tasks with some irritation but upon providing seated rest breaks becomes restless in ~5minute inquiring what the next task is going to be. Pt also makes several comments regarding his desire to go home and stating he doesn't understand  his purpose in being here - upon attempts at inquiring pt about his understanding of what a brain injury is patient states "I know about as much about that as you know about marlin fishing" (as pt ocean fishes for sport) deflecting the conversation; however, with further inquiring pt with difficulty describing what he believes a brain injury is - pt with limited receptiveness to education about this topic at this time. After completing OMs pt states "I've had enough walking" and requests to return to his room - attempt to redirect and engage patient in strengthening exercises; however, pt declines at this time. At end of session pt left L sidelying in bed with needs in reach and RN aware of pt's position. Missed 16 minutes of skilled physical therapy.  Therapy Documentation Precautions:  Precautions Precautions: Fall,Other (comment) Precaution Comments: impaired safety awareness Restrictions Weight Bearing Restrictions: No    Balance: Standardized Balance Assessment Standardized Balance Assessment: Functional Gait Assessment Functional Gait  Assessment Gait assessed : Yes Gait Level Surface: Walks 20 ft in less than 7 sec but greater than 5.5 sec, uses assistive device, slower speed, mild gait deviations, or deviates 6-10 in outside of the 12 in walkway width. Change in Gait Speed: Able to change speed, demonstrates mild gait deviations, deviates 6-10 in outside of the 12 in walkway width, or no gait deviations, unable to achieve a major change in velocity, or uses a change in velocity, or uses an assistive device. Gait with Horizontal Head Turns: Performs head turns smoothly with slight change in gait velocity (eg, minor disruption to smooth gait path), deviates 6-10 in outside 12 in walkway width, or uses an assistive device. Gait with Vertical Head Turns: Performs task with  slight change in gait velocity (eg, minor disruption to smooth gait path), deviates 6 - 10 in outside 12 in walkway width  or uses assistive device Gait and Pivot Turn: Pivot turns safely in greater than 3 sec and stops with no loss of balance, or pivot turns safely within 3 sec and stops with mild imbalance, requires small steps to catch balance. Step Over Obstacle: Is able to step over one shoe box (4.5 in total height) without changing gait speed. No evidence of imbalance. Gait with Narrow Base of Support: Ambulates 4-7 steps. Gait with Eyes Closed: Cannot walk 20 ft without assistance, severe gait deviations or imbalance, deviates greater than 15 in outside 12 in walkway width or will not attempt task. (pt initially declines to participate due to his impaired vision at baseline but then reconsiders and is agreeable) Ambulating Backwards: Walks 20 ft, uses assistive device, slower speed, mild gait deviations, deviates 6-10 in outside 12 in walkway width. Steps: Alternating feet, must use rail. Total Score: 17    Therapy/Group: Individual Therapy  Ginny Forth , PT, DPT, CSRS  05/23/2020, 3:33 PM

## 2020-05-23 NOTE — Progress Notes (Addendum)
Patient ID: Timothy Case, male   DOB: 1955/11/14, 65 y.o.   MRN: [id number]  SW met with pt and pt wife in room to provide updates from team conference, and d/c date remains 2/18. SW discussed family meeting. Family meeting scheduled for Thursday 2/10 9am-9:30am with pt wife, and son will join via telephone.   Loralee Pacas, MSW, [place] Office: [phone] Cell: [phone] Fax: [phone]

## 2020-05-23 NOTE — Progress Notes (Signed)
Physical Therapy Session Note  Patient Details  Name: Timothy Case MRN: [id number] Date of Birth: 1955/11/22  Today's Date: 05/23/2020 PT Individual Time: 1030-1100 PT Individual Time Calculation (min): 30 min   Short Term Goals: Week 2:  PT Short Term Goal 1 (Week 2): Patient will participate in standardized balance assessment with min cues for attention and behavior managment. PT Short Term Goal 2 (Week 2): Pt will ascend/descend 4 steps using B HRs with CGA. PT Short Term Goal 3 (Week 2): Patient will participate in >75% of his PT sessions for the full scheduled time. PT Short Term Goal 4 (Week 2): Patient will ambulate >150 ft with supervision for safety.  Skilled Therapeutic Interventions/Progress Updates:     Patient sitting EOB with his wife in the room upon PT arrival. Patient alert and agreeable to PT session. Patient reported low back pain during session, RN made aware. PT provided repositioning, rest breaks, and distraction as pain interventions throughout session.   Patient continues to perseverate on going home, concerns with the bed and alarms, and getting a Clinical research associate. Attempted to create a list of goals that the patient must meet prior to d/c and educated patient about changes to his environment that have been made during his stay (out of enclosure bed, alarms off, etc). Patient not receptive or willing to participate in these discussions. Patient asked PT to step out so he could speak to his wife. Discussed calling a lawyer with his wife and PT returned when patient's agitation escalated with patient cursing at his wife. Redirected patient by asking to to focus on therapy during therapy sessions. Patient then ambulated to the gym with therapists and participated in and completed a standardized balance assessment.   Therapeutic Activity: Transfers: Patient performed sit to/from stand x9 independently from bed and mat table.   Gait Training:  Patient ambulated >100  feet to/from the day room and performed ambulating laps and side shuffle spontaneously in the day room following balance assessment to demonstrate improved balance to therapist all with supervision for safety without LOB. Ambulated with wide BOS decreased gait speed (improved today), step height, and step length.  Neuromuscular Re-ed: Patient performed the Berg Balance Test: Patient demonstrates increased fall risk as noted by score of 51/56 on Berg Balance Scale.  (<36= high risk for falls, close to 100%; 37-45 significant >80%; 46-51 moderate >50%; 52-55 lower >25%)  Patient in recliner with his wife and Telesitter in the room at end of session with breaks locked, left alarm off per team discussion in conference, and all needs within reach.    Therapy Documentation Precautions:  Precautions Precautions: Fall,Other (comment) Precaution Comments: impaired safety awareness Restrictions Weight Bearing Restrictions: No   Therapy/Group: Individual Therapy  Tyvion Edmondson L Arlind Klingerman PT, DPT  05/23/2020, 12:33 PM

## 2020-05-23 NOTE — Progress Notes (Signed)
Occupational Therapy Session Note  Patient Details  Name: Timothy Case MRN: [id number] Date of Birth: December 06, 1955  Today's Date: 05/23/2020 OT Individual Time: 1135-1200 OT Individual Time Calculation (min): 25 min    Short Term Goals: Week 2:  OT Short Term Goal 1 (Week 2): Pt will verbalize 1 defict from TBI to demo improved awareness OT Short Term Goal 2 (Week 2): Pt will terminate bathing sequence appropriately with no VC OT Short Term Goal 3 (Week 2): Pt will complete toileting with S OT Short Term Goal 4 (Week 2): Pt will complete dual task with min cuing to demo improved processing  Skilled Therapeutic Interventions/Progress Updates:    Pt received supine with no c/o pain. His wife was present for session. He was agreeable to take shower. Pt completed shower at distant supervision level overall. He had good carryover of previously taught safety cueing- sitting to don pants. He became frustrated in the shower with the hose being turned the wrong way, but was able to maintain composure and fix it himself- demonstrating improvement in frustration tolerance. Pt completed 250 ft of functional mobility at supervision- mod I level. Pt returned to his room and was left sitting up in the recliner with his wife present.   Therapy Documentation Precautions:  Precautions Precautions: Fall,Other (comment) Precaution Comments: impaired safety awareness Restrictions Weight Bearing Restrictions: No   Therapy/Group: Individual Therapy  Crissie Reese 05/23/2020, 6:16 AM

## 2020-05-23 NOTE — Progress Notes (Signed)
Speech Language Pathology Weekly Progress and Session Note  Patient Details  Name: Timothy Case MRN: [id number] Date of Birth: [birth year]  Beginning of progress report period: May 12, 2020 End of progress report period: May 23, 2020  Today's Date: 05/23/2020 SLP Individual Time: 0915-1000 SLP Individual Time Calculation (min): 45 min  Short Term Goals: Week 1: SLP Short Term Goal 1 (Week 1): Pt will utilize external aids to reorient to place, date, and situation with mod assist multimodal cues. SLP Short Term Goal 1 - Progress (Week 1): Not met SLP Short Term Goal 2 (Week 1): Pt will complete basic, familiar tasks wtih mod assist multimodal cues. SLP Short Term Goal 2 - Progress (Week 1): Not met SLP Short Term Goal 3 (Week 1): Pt will return demonstration of at least 2 safety precautions during functional tasks with mod assist multimodal cues. SLP Short Term Goal 3 - Progress (Week 1): Not met SLP Short Term Goal 4 (Week 1): Pt will sustain his attention to basic, familiar tasks for ~2 minute intervals with mod cues for redirection. SLP Short Term Goal 4 - Progress (Week 1): Not met SLP Short Term Goal 5 (Week 1): Pt will consume regular textures and thin liquids with min cues for use of universal precautions and minimal overt s/s of aspiration. SLP Short Term Goal 5 - Progress (Week 1): Met    New Short Term Goals: Week 2: SLP Short Term Goal 1 (Week 2): Pt will utilize external aids to reorient to place, date, and situation with mod assist multimodal cues. SLP Short Term Goal 2 (Week 2): Pt will complete basic, familiar tasks wtih mod assist multimodal cues. SLP Short Term Goal 3 (Week 2): Pt will return demonstration of at least 2 safety precautions during functional tasks with mod assist multimodal cues. SLP Short Term Goal 4 (Week 2): Pt will sustain his attention to basic, familiar tasks for ~2 minute intervals with mod cues for redirection. SLP Short Term  Goal 5 (Week 2): Pt will consume regular textures and thin liquids with Mod I for use of universal precautions and minimal overt s/s of aspiration.  Weekly Progress Updates: Patient has made minimal gains and has met 1 of 5 STGs this reporting period. Currently, patient continues to demonstrate behaviors consistent with a [place]o Level IV-emerging V and requires overall Max A multimodal cues for participation in functional tasks. Due to this, patient continues to require overall Max A multimodal cues to complete functional and familiar tasks safely in regards to initiation, problem solving, attention, recall and awareness. Patient also continues to demonstrate intermittent agitation which can be difficult to redirect at times. Patient is consuming regular textures with thin liquids without overt s/s of aspiration and requires full supervision for use of swallowing compensatory strategies. Patient and family education ongoing. Patient would benefit from continued skilled SLP intervention to maximize his cognitive and swallowing function and overall functional independence prior to discharge.      Intensity: Minumum of 1-2 x/day, 30 to 90 minutes Frequency: 3 to 5 out of 7 days Duration/Length of Stay: 2/18 Treatment/Interventions: Cognitive remediation/compensation;Cueing hierarchy;Functional tasks;Patient/family education;Environmental controls;Dysphagia/aspiration precaution training;Internal/external aids   Daily Session  Skilled Therapeutic Interventions: Skilled treatment session focused on cognitive goals. SLP facilitated session administering the Cognistat. Patient demonstrated mild impairments in short-term memory and calculations and moderate impairments in visual construction tasks. Throughout evaluation, patient with decreased frustration tolerance, minimal awareness of cognitive changes and verbosity regarding baseline cognitive/economic status despite Max A multimodal cues  for redirection.  Patient left upright in bed with alarm on and all needs within reach. Continue with current plan of care.      Pain Pain Assessment Pain Scale: 0-10 Pain Score: 3  Pain Location: Heel  Therapy/Group: Individual Therapy  Corrisa Gibby 05/23/2020, 6:30 AM

## 2020-05-23 NOTE — Consult Note (Signed)
Neuropsychological Consultation   Patient:   Timothy Case [facility]   DOB:   1955/07/27  MR Number:  [id number]  Location:  [facility] [address] CENTER A [id number] [place] [address] Dept: [phone] Loc: [phone]           Date of Service:   05/23/2020  Start Time:   1:15 PM End Time:   2:15 PM  Provider/Observer:  Arley Phenix, Psy.D.       Clinical Neuropsychologist       Billing Code/Service: [id number]  Chief Complaint:    Timothy Case is a 66 year old right-handed male with a prior history of CVA maintained on aspirin, Covid infection in March and then again in December 2021, hypertension, hyperlipidemia, history of prior significant concussive event and history of alcohol use.  Patient describes prior event that he reported as 66 but no medical records of that incident of that date.  Patient reports that he fell off a ladder face first breaking his nose and causing an extended loss of consciousness and dislocation of his left shoulder.  Patient described experiences in the ED quite vividly providing a great deal of detail of the events in the emergency department.  The patient had a TIA 04/21/2012 with acute onset and an acute symptoms but clearing by the next day.  Patient had normal MRI.  Patient presented on 05/04/2020 after being found down by his wife with altered mental status and aphasia.  Wife reported patient having recent headache.  The specific events around his likely fall with head trauma are unclear as the patient was found in the house with broken back door but suspicion that he had fallen at the front door and made his way around to the back door.  Patient with retrograde and anterograde amnesia around events of his fall.  Cranial CT scan showed bilateral subdural hematomas without midline shift.  Diffuse subarachnoid hemorrhage predominantly in the sylvian fissures with  sparing of the basilar cisterns.  There were bifrontal hemorrhagic contusions.  Follow-ups CT scan showed blooming of hemorrhagic contusions in the bilateral inferior frontal lobes and right cerebellum.  Follow-up imaging did show slight increase in right frontal parietal subdural hematoma which measured 7 mm in thickness.  There were findings of nondisplaced right occipital bone fracture.  EEG was negative for seizure and maintained on Keppra for seizure prophylaxis.  Neurosurgery and neurology consulted and advised conservative care.  Patient with extended bouts of agitation initially requiring sedation.  Patient continued with significant agitation, confusion and impulsivity with balance deficits during inpatient rehab stay.  Repeatedly requesting to go home and stating that there was nothing wrong with him and that he was fine.  Clearly, the patient has been confused and agitated with consistent findings of frontal lobe involvement in his neurological/neuropsychological deficits.  Reason for Service:  Patient was referred for neuropsychological consultation due to current cognitive deficits and behavioral dyscontrol.  Below is the HPI for the current admission.  [name] a 65 year old right-handed male with history of CVA maintained on aspirin, Covid infection in March and then again in December 2021, hypertension, hyperlipidemia, history of alcohol use. Presented 05/04/2020 after being found down by his wife with altered mental status and aphasia. Wife had reported recent headache. Admission chemistries unremarkable except BUN 28 glucose 131 SARS coronavirus positive, urine culture no growth. Cranial CT scan showed bilateral subdural hematomas without midline shift. Diffuse subarachnoid hemorrhage predominantly in the sylvian fissures  with sparing of the basilar cisterns. There were bifrontal hemorrhagic contusions. Negative for facial fracture negative cervical spine. CTA of  head and neck no vascular malformation. Follow-up CT scan of the head expected blooming of hemorrhagic contusions in the bilateral inferior frontal lobes and right cerebellum. Traumatic pattern subarachnoid hemorrhage with either increased or diffusion since prior tracing. Slight increase in right frontal parietal subdural hematoma which measures up to 7 mm in thickness. No hydrocephalus. There were findings of nondisplaced right occipital bone fracture. EEG negative for seizure maintained on Keppra for seizure prophylaxis. Neurosurgery follow-up Dr. Venetia Maxon as well as neurology services consulted advised conservative care. Patient was cleared to initiate subcutaneous heparin for DVT prophylaxis. Hospital course patient did require intubation for airway protection self extubated 05/06/2020. Patient with bouts of atrial fibrillation RVR placed on intravenous Cardizem and later discontinued and currently maintained on Coreg. Echocardiogram was completed showing ejection fraction of 60 to 65% grade 2 diastolic dysfunction. In regards to patient's Covid positive testing follow-up rapid antigen test was negative it was felt that Covid positive PCR test indicated dead virus shedding rather than active infection and isolation discontinued. He did have bouts of agitation initially requiring Precedex as well as Haldol. His diet was advanced to a regular consistency however he still was receiving nasogastric tube feeds for nutritional support.. Therapy evaluations completed due to patient decreased functional mobility cognitive impairment was admitted for a comprehensive rehab program.  Current Status:  Upon entering the room, the patient did not remember speaking with me briefly a few days prior.  Patient alert, ruminative and impulsive throughout visit.  Patient's wife was present as well.  Patient has made significant improvements and is now had many of safety restraint measures that have been in place  removed.  Patient was still confused but extremely verbal.  Patient ruminating and fixating on the need to go home and the anxiety and stress he has been in the hospital.  Patient minimizes any cognitive deficits that he currently has.  He did ultimately acknowledge that he had been having difficulties and he continues to not quite be himself.  Discussions were made about his need for full participation in therapeutic efforts and the patient described issues with pain medicines and difficulty sleeping at night as complicating factors.  Patient repetitively talked about leaving AMA and wanting legal documents made available so he could sign himself out.  At this point, the patient is not competent to make this decision.  He has made significant improvements over the past couple of days but continues to have ongoing deficits consistent with significant bilateral frontal involvement.  Patient denies depression but acknowledges significant anxiety which likely was playing a role even before this TBI.  Both the patient's wife and son acknowledge significant changes in behavior more of the nature of expansive and exaggerated personality styles versus a change in personality.  Patient is likely always been a very driven demanding individual and with loss of executive functioning these features have grown considerably.  Again, however, he has been making significant improvements.  We will need to continue to stress the importance of complete involvement and active participation in therapeutic efforts which he is not always been willing to do.  Patient agreed to participate in all therapeutic efforts between now and Friday and I will try to return and have another discussion with him about his status.  Behavioral Observation: Timothy Case  presents as a 65 y.o.-year-old Right handed  Male who appeared his stated  age. his dress was Appropriate and he was Well Groomed and his manners were Appropriate,  inappropriate to the situation.  his participation was indicative of Inattentive, Monopolizing and Resistant behaviors.  There were not physical disabilities noted.  he displayed an inappropriate level of cooperation and motivation.     Interactions:    Active Intrusive, Inattentive and Resistant  Attention:   abnormal and attention span appeared shorter than expected for age  Memory:   abnormal; global memory impairment noted  Visuo-spatial:  not examined  Speech (Volume):  normal  Speech:   normal; normal  Thought Process:  Circumstantial, Tangential and Disorganized  Though Content:  Rumination; not suicidal and not homicidal  Orientation:   person, place and time/date  Judgment:   Poor  Planning:   Poor  Affect:    Anxious and Irritable  Mood:    Dysphoric and Irritable  Insight:   Shallow  Intelligence:   very high  Substance Use:  There are suspicions of alcohol abuse reported by the patient.    Medical History:   Past Medical History:  Diagnosis Date  . Chronic lower back pain    "q am; cause I'm overweight" (04/21/2012)  . Essential familial hyperlipidemia   . GERD (gastroesophageal reflux disease)   . Hyperlipidemia   . Hypertension   . Shortness of breath    "lying down; sometimes" (04/21/2012)  . Stroke (HCC) 04/21/2012   "they are leaning towards mini stroke today; all S/S still gone now except little chest pain" (04/21/2012)         Patient Active Problem List   Diagnosis Date Noted  . PAF (paroxysmal atrial fibrillation) (HCC)   . Hyponatremia   . Slow transit constipation   . Essential hypertension   . TBI (traumatic brain injury) (HCC) 05/12/2020  . Contusion of face   . Subarachnoid hemorrhage following injury, with loss of consciousness (HCC)   . Agitation   . Hypokalemia   . Subdural hematoma (HCC) 05/04/2020  . Subarachnoid bleed (HCC) 05/04/2020  . Nonallopathic lesion of sacral region 05/06/2017  . Nonallopathic lesion of lumbar region  05/06/2017  . Nonallopathic lesion of thoracic region 05/06/2017  . 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 Acute encephalopathy 04/22/2012  . TIA (transient ischemic attack) 04/22/2012  . HTN (hypertension) 04/21/2012  . Delirium 04/21/2012  . Obesity 04/21/2012  . Hyperlipidemia 04/21/2012  . GERD (gastroesophageal reflux disease) 04/21/2012        Abuse/Trauma History: No psychological or abusive history noted but the patient did report a significant fall from ladder with head trauma and concussive event in the past that also resulted in orthopedic injury.  Psychiatric History:  No prior psychiatric history.  Family Med/Psych History: History reviewed. No pertinent family history.  Risk of Suicide/Violence: low patient denies any suicidal homicidal ideation but he continues to be very impulsive and at times agitated but has not been aggressive or verbally abusive of others.  Impression/DX:  Timothy Case is a 65 year old right-handed male with a prior history of CVA maintained on aspirin, Covid infection in March and then again in December 2021, hypertension, hyperlipidemia, history of prior significant concussive event and history of alcohol use.  Patient describes prior event that he reported as 67 but no medical records of that incident of that date.  Patient reports that he fell off a ladder face first breaking his nose and causing an extended loss of consciousness and dislocation of his left shoulder.  Patient described experiences  in the ED quite vividly providing a great deal of detail of the events in the emergency department.  The patient had a TIA 04/21/2012 with acute onset and an acute symptoms but clearing by the next day.  Patient had normal MRI.  Patient presented on 05/04/2020 after being found down by his wife with altered mental status and aphasia.  Wife reported patient having recent headache.  The specific events around his likely fall with head  trauma are unclear as the patient was found in the house with broken back door but suspicion that he had fallen at the front door and made his way around to the back door.  Patient with retrograde and anterograde amnesia around events of his fall.  Cranial CT scan showed bilateral subdural hematomas without midline shift.  Diffuse subarachnoid hemorrhage predominantly in the sylvian fissures with sparing of the basilar cisterns.  There were bifrontal hemorrhagic contusions.  Follow-ups CT scan showed blooming of hemorrhagic contusions in the bilateral inferior frontal lobes and right cerebellum.  Follow-up imaging did show slight increase in right frontal parietal subdural hematoma which measured 7 mm in thickness.  There were findings of nondisplaced right occipital bone fracture.  EEG was negative for seizure and maintained on Keppra for seizure prophylaxis.  Neurosurgery and neurology consulted and advised conservative care.  Patient with extended bouts of agitation initially requiring sedation.  Patient continued with significant agitation, confusion and impulsivity with balance deficits during inpatient rehab stay.  Repeatedly requesting to go home and stating that there was nothing wrong with him and that he was fine.  Clearly, the patient has been confused and agitated with consistent findings of frontal lobe involvement in his neurological/neuropsychological deficits.  Upon entering the room, the patient did not remember speaking with me briefly a few days prior.  Patient alert, ruminative and impulsive throughout visit.  Patient's wife was present as well.  Patient has made significant improvements and is now had many of safety restraint measures that have been in place removed.  Patient was still confused but extremely verbal.  Patient ruminating and fixating on the need to go home and the anxiety and stress he has been in the hospital.  Patient minimizes any cognitive deficits that he currently has.  He  did ultimately acknowledge that he had been having difficulties and he continues to not quite be himself.  Discussions were made about his need for full participation in therapeutic efforts and the patient described issues with pain medicines and difficulty sleeping at night as complicating factors.  Patient repetitively talked about leaving AMA and wanting legal documents made available so he could sign himself out.  At this point, the patient is not competent to make this decision.  He has made significant improvements over the past couple of days but continues to have ongoing deficits consistent with significant bilateral frontal involvement.  Patient denies depression but acknowledges significant anxiety which likely was playing a role even before this TBI.  Both the patient's wife and son acknowledge significant changes in behavior more of the nature of expansive and exaggerated personality styles versus a change in personality.  Patient is likely always been a very driven demanding individual and with loss of executive functioning these features have grown considerably.  Again, however, he has been making significant improvements.  We will need to continue to stress the importance of complete involvement and active participation in therapeutic efforts which he is not always been willing to do.  Patient agreed to participate  in all therapeutic efforts between now and Friday and I will try to return and have another discussion with him about his status.  Disposition/Plan:  At this point, the patient continues to have significant executive functioning deficits with impairments in attention, inhibition of impulsive responses, frustration and irritable, confused and ruminating about desire to be discharged or to leave.  Is not competent to make decisions and family are very supportive of continued inpatient rehabilitative efforts.  Today, the patient agreed to work more fully with therapeutic efforts between now  and Friday and I agreed to review those notes and have a discussion with him on Friday.  Diagnosis:    Traumatic brain injury from suspected unwitnessed fall with bilateral frontal and frontal parietal subdural hematoma and cerebellar subdural hematoma.        Electronically Signed   _______________________ Arley Phenix, Psy.D. Clinical Neuropsychologist

## 2020-05-23 NOTE — Progress Notes (Signed)
[facility] PHYSICAL MEDICINE & REHABILITATION PROGRESS NOTE   Subjective/Complaints: Says he didn't sleep well because bed alarm was going off continuously last night. Wants to go home.   ROS: Limited due to cognitive/behavioral    Objective:   No results found. No results for input(s): WBC, HGB, HCT, PLT in the last 72 hours. Recent Labs    05/22/20 1353  NA 133*  K 4.8  CL 96*  CO2 24  GLUCOSE 122*  BUN 23  CREATININE 1.14  CALCIUM 8.9    Intake/Output Summary (Last 24 hours) at 05/23/2020 3614 Last data filed at 05/23/2020 0800 Gross per 24 hour  Intake 440 ml  Output -  Net 440 ml        Physical Exam: Vital Signs Blood pressure (!) 145/87, pulse 75, temperature 97.8 F (36.6 C), resp. rate 18, height 5' 9.02" (1.753 m), weight 106.6 kg, SpO2 100 %. Constitutional: No distress . Vital signs reviewed. HEENT: EOMI, oral membranes moist Neck: supple Cardiovascular: RRR without murmur. No JVD    Respiratory/Chest: CTA Bilaterally without wheezes or rales. Normal effort    GI/Abdomen: BS +, non-tender, non-distended Ext: no clubbing, cyanosis, or edema Psych: more appropriate. Less irritable. Still perseverative Musc: No edema in extremities.  No tenderness in extremities. Neurologic Alert and oriented to name, place, reason  Motor: 4+ to 5/5.good sitting balance. Transferred without assistance from EOB.    Assessment/Plan: 1. Functional deficits which require 3+ hours per day of interdisciplinary therapy in a comprehensive inpatient rehab setting.  Physiatrist is providing close team supervision and 24 hour management of active medical problems listed below.  Physiatrist and rehab team continue to assess barriers to discharge/monitor patient progress toward functional and medical goals  Care Tool:  Bathing    Body parts bathed by patient: Right arm,Left arm,Chest,Abdomen,Right upper leg,Left upper leg,Face   Body parts bathed by helper: Front perineal  area,Buttocks,Right lower leg,Left lower leg     Bathing assist Assist Level: Moderate Assistance - Patient 50 - 74%     Upper Body Dressing/Undressing Upper body dressing   What is the patient wearing?: Hospital gown only,Pull over shirt    Upper body assist Assist Level: Minimal Assistance - Patient > 75%    Lower Body Dressing/Undressing Lower body dressing      What is the patient wearing?: Incontinence brief     Lower body assist Assist for lower body dressing: Minimal Assistance - Patient > 75%     Toileting Toileting    Toileting assist Assist for toileting: Maximal Assistance - Patient 25 - 49% Assistive Device Comment: Urinal   Transfers Chair/bed transfer  Transfers assist     Chair/bed transfer assist level: Supervision/Verbal cueing     Locomotion Ambulation   Ambulation assist      Assist level: Supervision/Verbal cueing Assistive device: No Device Max distance: >300 ft   Walk 10 feet activity   Assist     Assist level: Supervision/Verbal cueing Assistive device: No Device   Walk 50 feet activity   Assist    Assist level: Supervision/Verbal cueing Assistive device: No Device    Walk 150 feet activity   Assist    Assist level: Supervision/Verbal cueing Assistive device: No Device    Walk 10 feet on uneven surface  activity   Assist Walk 10 feet on uneven surfaces activity did not occur: Safety/medical concerns   Assist level: Supervision/Verbal cueing Assistive device: Other (comment) (intermittent use of rail)   Wheelchair  Assist Will patient use wheelchair at discharge?: No             Wheelchair 50 feet with 2 turns activity    Assist            Wheelchair 150 feet activity     Assist          Blood pressure (!) 145/87, pulse 75, temperature 97.8 F (36.6 C), resp. rate 18, height 5' 9.02" (1.753 m), weight 106.6 kg, SpO2 100 %.    Medical Problem List and  Plan: 1.TBI/SDH/nondisplaced right occipital bone fracturesecondary tounwitnessed fall 05/04/2020  Continue CIR PT, OT, SLP --team conf today  -ELOS 06/02/20 2. Antithrombotics: -DVT/anticoagulation:Subcutaneous heparin 05/08/2020 -antiplatelet therapy: N/A 3. Pain Management:Oxycodone as needed  Appears controlled on 2/6 4. Mood/sleep:Melatonin 5 mg nightly as needed. -continue sleep chart -antipsychotic agents: seroquel  2/3 continue seroquel 100mg  qhs with 50mg  scheduled in morning   -seroquel 50mg  prn for moderate agitation   -propranolol 40mg  TID---change to XR form   -IM Ativan for severe agitation   -consider re-trial of ritalin for attention   -maintain behavioral plan  Improving behavior   2/7-moved to low bed   -reducedAM seroquel to 25mg   2/8: may try to reduce bed alarm/sensitivity--d/w nursing today 5. Neuropsych: This patientis notcapable of making decisions on hisown behalf. 6. Skin/Wound Care:Routine skin checks 7. Fluids/Electrolytes/Nutrition: -labs ok 2/7 8. Seizure prophylaxis. Keppra 1000 mg mg twice daily  Decreased to 750mg  bid on 2/3, reduce to 500mg  bid with PM dose 2/7 9. Atrial fibrillation with RVR. Intravenous Cardizem discontinued. dc Coreg 25 mg as below, start propranolol for behavioral benefits Controlled on 2/8 10. Hypertension.   Vitals:   05/23/20 0344 05/23/20 0910  BP: (!) 143/98 (!) 145/87  Pulse: 85 75  Resp: 18   Temp: 97.8 F (36.6 C)   SpO2: 100%     2/3 Continue propranolol in place of coreg--change to XR form  continue norvasc  lisinopril 10mg  added  Fairly Controlled on 2/8 11. Decreased nutritional storage/Dysphagia:  Diet has been advanced to regular.  12. History of alcohol use. Monitor for any signs of withdrawal 13. Constipation. MiraLAX as directed. Adjust bowel program as needed.  Bowel meds increased on 2/4  Improving 14. History  recent Covid March and then again December 2021. Rapid antigen test was negative. It was felt that Covid positive PCR test indicated dead virus shedding rather than active infection and isolation discontinued. 15.  Hyponatremia  Sodium 133 on 2/7   Continue to monitor  LOS: 11 days A FACE TO FACE EVALUATION WAS PERFORMED  4/8 05/23/2020, 9:27 AM

## 2020-05-23 NOTE — Patient Care Conference (Signed)
Inpatient RehabilitationTeam Conference and Plan of Care Update Date: 05/23/2020   Time: 10:19 AM    Patient Name: Timothy Case [facility]      Medical Record Number: [id number]  Date of Birth: [birth year] Sex: Male         Room/Bed: 4W05C/4W05C-01 Payor Info: Payor: MEDICARE / Plan: MEDICARE PART A AND B / Product Type: *No Product type* /    Admit Date/Time:  05/12/2020  4:10 PM  Primary Diagnosis:  TBI (traumatic brain injury) [facility])  Hospital Problems: Principal Problem:   TBI (traumatic brain injury) (HCC) Active Problems:   Hyponatremia   Slow transit constipation   Essential hypertension   PAF (paroxysmal atrial fibrillation) [facility])    Expected Discharge Date: Expected Discharge Date: 06/02/20  Team Members Present: Physician leading conference: Dr. Faith Rogue Care Coodinator Present: Cecile Sheerer, LCSWA;Xavia Kniskern Marlyne Beards, RN, BSN, CRRN Nurse Present: Other (comment) Freddrick March, RN) PT Present: Serina Cowper, PT OT Present: Jake Shark, OT SLP Present: Feliberto Gottron, SLP PPS Coordinator present : Fae Pippin, SLP     Current Status/Progress Goal Weekly Team Focus  Bowel/Bladder   Continent of B&B.  Continue continence.  Toilet as needed.   Swallow/Nutrition/ Hydration   Regular textures with thin liquids, Full supervision  Mod I  use of swallow strategies   ADL's   Still remains agitated with staff, perseverative on going home/calling police/attorney. Supervision to CGA with transfers and ADLs  Supervision overall  Reduction of agitation, cognitive retraining, orientation, awareness, ADLs   Mobility   CGA-supervision overall, gait >300 ft, 12 steps B rails, decreased activity tolerance, frustration tolerance, safety awareness, attention, and incite into deficits reducing safety with mobility putting patient at increased fall risk.  Supervision overall  Activity tolerance, staff tolerance, participation, safety awareness, balance, functional  mobility, stimulation tolerance, gait and stair training, patient/caregiver education.   Communication             Safety/Cognition/ Behavioral Observations  [place] Level IV-V, Max A  Min A  participation, management of agitation, problem solving, awareness   Pain   Headache from 2 to 10 out of 10. Tylenol somewhat effective. Patient usually but not always declines narcotics.  <3 out of 10.  Assess pain q shift.   Skin   Scab to right elbow and bruising to BUE.  No new breakdown  Assess skin q shift.     Discharge Planning:  D/c to home with his wife/. PRN support from their adult son.   Team Discussion: He is out of the enclosure bed and into a low bed with floor mats. MD says to not use the bed alarm to decrease his agitation. With every move in the bed the alarm goes off. Continent B/B. Mild headache, will only take Tylenol. Will need to schedule a family meeting, as wife wants to know when he will return to "normal." Patient on target to meet rehab goals: Hit or miss with OT. Refuses a lot of sessions. Says he doesn't need to be here. Wants to call the police and a Clinical research associate. If he starts a tasksio, quickly becomes frustrated and decides not to complete the task or even do task. Supervision goals. Contact guard to supervision with gait >300 ft. Frustrates easily, poor attention, poor safety, increased fall risk. Supervision goals. Speech working on [employer], math impairment, and management of agitation. Min assist goals.  *See Care Plan and progress notes for long and short-term goals.   Revisions to Treatment Plan:  Continue to monitor sleep/wake  cycle, agitation. Schedule family meeting.  Teaching Needs: Continue family education, medication management, safety awareness, balance training, gait training, agitation management.  Current Barriers to Discharge: Inaccessible home environment, Decreased caregiver support, Home enviroment access/layout, Lack of/limited family support, Medication  compliance and Behavior  Possible Resolutions to Barriers: Continue current medications, provide emotional support to patient and family.     Medical Summary Current Status: improving agitation and awareness. balance better. moved to low-bed yesterday---bed alarm kept him awake, has telesitter  Barriers to Discharge: Medical stability   Possible Resolutions to [employer] Focus: daily assessment of labs, pt data, improve sleep/agitation   Continued Need for Acute Rehabilitation Level of Care: The patient requires daily medical management by a physician with specialized training in physical medicine and rehabilitation for the following reasons: Direction of a multidisciplinary physical rehabilitation program to maximize functional independence : Yes Medical management of patient stability for increased activity during participation in an intensive rehabilitation regime.: Yes Analysis of laboratory values and/or radiology reports with any subsequent need for medication adjustment and/or medical intervention. : Yes   I attest that I was present, lead the team conference, and concur with the assessment and plan of the team.   Tennis Must 05/23/2020, 2:11 PM

## 2020-05-23 NOTE — Progress Notes (Signed)
Occupational Therapy Session Note  Patient Details  Name: Man Effertz MRN: [id number] Date of Birth: 29-May-1955  Today's Date: 05/23/2020 OT Individual Time: 1415-1505 OT Individual Time Calculation (min): 50 min    Short Term Goals: Week 1:  OT Short Term Goal 1 (Week 1): Pt will maintain arousal for >30 min to participate in tx session consistently OT Short Term Goal 1 - Progress (Week 1): Met OT Short Term Goal 2 (Week 1): pt will transfer to toilet wiht MIN A OT Short Term Goal 2 - Progress (Week 1): Met OT Short Term Goal 3 (Week 1): Pt will thread BLE into pants OT Short Term Goal 3 - Progress (Week 1): Met OT Short Term Goal 4 (Week 1): Pt will sequence oral care with MOD VC OT Short Term Goal 4 - Progress (Week 1): Met  Skilled Therapeutic Interventions/Progress Updates:    Patient seated on edge of bed, just finished with neuro psych and calm at this time.  Wife exited when this session began.  Patient denies pain, he is unable to recall meeting me or our session yesterday.  He is able to ambulate in room without LOB, uses the bathroom independently.  He agrees to complete visual perceptual assessment (MVPT) - note overall thoughtful timely responses and no impulsivity noted with assessment - scored 32/36.   Reviewed results and discussed recall, perception and impulsivity - he continues with limited insight but improving.  He engaged in appropriate social conversation.  Ambulated on unit with CS - discussed need for supervision when out of his room due to safety and patient confidentiality.  He returned to room at close of session.  No alarms set as per team plan and to decrease agitating factors.    Therapy Documentation Precautions:  Precautions Precautions: Fall,Other (comment) Precaution Comments: impaired safety awareness Restrictions Weight Bearing Restrictions: No   Therapy/Group: Individual Therapy  Carlos Levering 05/23/2020, 7:51 AM

## 2020-05-24 NOTE — Progress Notes (Signed)
[place] PHYSICAL MEDICINE & REHABILITATION PROGRESS NOTE   Subjective/Complaints: Seems to have done better last night in regular bed without alarms, telesitter, etc. Was sleeping soundly when I came in  ROS: Limited due to cognitive/behavioral   Objective:   No results found. No results for input(s): WBC, HGB, HCT, PLT in the last 72 hours. Recent Labs    05/22/20 1353  NA 133*  K 4.8  CL 96*  CO2 24  GLUCOSE 122*  BUN 23  CREATININE 1.14  CALCIUM 8.9    Intake/Output Summary (Last 24 hours) at 05/24/2020 0813 Last data filed at 05/23/2020 1852 Gross per 24 hour  Intake 380 ml  Output --  Net 380 ml        Physical Exam: Vital Signs Blood pressure 118/62, pulse 70, temperature 97.7 F (36.5 C), resp. rate 20, height 5' 9.02" (1.753 m), weight 106.6 kg, SpO2 98 %. Constitutional: No distress . Vital signs reviewed. HEENT: EOMI, oral membranes moist Neck: supple Cardiovascular: RRR without murmur. No JVD    Respiratory/Chest: CTA Bilaterally without wheezes or rales. Normal effort    GI/Abdomen: BS +, non-tender, non-distended Ext: no clubbing, cyanosis, or edema Psych: just waking up. A little slow to process Musc: No edema in extremities.  No tenderness in extremities. Neurologic oriented to person, place, follows basic commands Motor: 4+ to 5/5.good sitting balance. Transferred without assistance from EOB.    Assessment/Plan: 1. Functional deficits which require 3+ hours per day of interdisciplinary therapy in a comprehensive inpatient rehab setting.  Physiatrist is providing close team supervision and 24 hour management of active medical problems listed below.  Physiatrist and rehab team continue to assess barriers to discharge/monitor patient progress toward functional and medical goals  Care Tool:  Bathing    Body parts bathed by patient: Right arm,Left arm,Chest,Abdomen,Right upper leg,Left upper leg,Face   Body parts bathed by helper: Front  perineal area,Buttocks,Right lower leg,Left lower leg     Bathing assist Assist Level: Moderate Assistance - Patient 50 - 74%     Upper Body Dressing/Undressing Upper body dressing   What is the patient wearing?: Hospital gown only,Pull over shirt    Upper body assist Assist Level: Minimal Assistance - Patient > 75%    Lower Body Dressing/Undressing Lower body dressing      What is the patient wearing?: Incontinence brief     Lower body assist Assist for lower body dressing: Minimal Assistance - Patient > 75%     Toileting Toileting    Toileting assist Assist for toileting: Independent Assistive Device Comment: Urinal   Transfers Chair/bed transfer  Transfers assist     Chair/bed transfer assist level: Independent     Locomotion Ambulation   Ambulation assist      Assist level: Supervision/Verbal cueing Assistive device: No Device Max distance: 1,075f   Walk 10 feet activity   Assist     Assist level: Supervision/Verbal cueing Assistive device: No Device   Walk 50 feet activity   Assist    Assist level: Supervision/Verbal cueing Assistive device: No Device    Walk 150 feet activity   Assist    Assist level: Supervision/Verbal cueing Assistive device: No Device    Walk 10 feet on uneven surface  activity   Assist Walk 10 feet on uneven surfaces activity did not occur: Safety/medical concerns   Assist level: Supervision/Verbal cueing Assistive device: Other (comment) (intermittent use of rail)   Wheelchair     Assist Will patient use wheelchair at  discharge?: No             Wheelchair 50 feet with 2 turns activity    Assist            Wheelchair 150 feet activity     Assist          Blood pressure 118/62, pulse 70, temperature 97.7 F (36.5 C), resp. rate 20, height 5' 9.02" (1.753 m), weight 106.6 kg, SpO2 98 %.    Medical Problem List and Plan: 1.TBI/SDH/nondisplaced right occipital bone  fracturesecondary tounwitnessed fall 05/04/2020  Continue CIR PT, OT, SLP -- family conference tomorrow. Spoke with Mrs. Esther yesterday. She also met with Dr. Sima Matas  -ELOS 06/02/20 2. Antithrombotics: -DVT/anticoagulation:Subcutaneous heparin 05/08/2020 -antiplatelet therapy: N/A 3. Pain Management:Oxycodone as needed  Appears controlled on 2/6 4. Mood/sleep:Melatonin 5 mg nightly as needed. -continue sleep chart -antipsychotic agents: seroquel  2/9 continue seroquel 122m qhs with 257mscheduled in morning   -seroquel 5011mrn for moderate agitation   -propranolol XR 120m95mily   -IM Ativan for severe agitation   -maintain behavioral plan    -reduction of monitoring/restraints has helped 5. Neuropsych: This patientis notcapable of making decisions on hisown behalf. 6. Skin/Wound Care:Routine skin checks 7. Fluids/Electrolytes/Nutrition: -labs ok 2/7 8. Seizure prophylaxis. Keppra 1000 mg mg twice daily  Decreased to 750mg43m on 2/3, reduce to 500mg 21mwith PM dose 2/7 9. Atrial fibrillation with RVR. Intravenous Cardizem discontinued. dc Coreg 25 mg as below, start propranolol for behavioral benefits Controlled on 2/9 10. Hypertension.   Vitals:   05/23/20 2001 05/24/20 0339  BP: 125/89 118/62  Pulse: 71 70  Resp: 20 20  Temp: 98 F (36.7 C) 97.7 F (36.5 C)  SpO2: 99% 98%    Continue propranolol in place of coreg--change to XR form  continue norvasc  lisinopril 10mg a31m  Good control 2/9 11. Decreased nutritional storage/Dysphagia:  Diet has been advanced to regular.  12. History of alcohol use. Monitor for any signs of withdrawal 13. Constipation. MiraLAX as directed. Adjust bowel program as needed.  Bowel meds increased on 2/4  Had bm 2/8 14. History recent Covid March and then again December 2021. Rapid antigen test was negative. It was felt that Covid positive  PCR test indicated dead virus shedding rather than active infection and isolation discontinued. 15.  Hyponatremia  Sodium 133 on 2/7   Continue to monitor  LOS: 12 days A FACE TO FACE EVALUATION WAS PERFORMED  ZacharyMeredith Staggers22, 8:13 AM

## 2020-05-24 NOTE — Progress Notes (Signed)
Speech Language Pathology Daily Session Note  Patient Details  Name: Timothy Case MRN: [id number] Date of Birth: July 31, 1955  Today's Date: 05/24/2020 SLP Individual Time: 1132-1209 SLP Individual Time Calculation (min): 37 min  Short Term Goals: Week 2: SLP Short Term Goal 1 (Week 2): Pt will utilize external aids to reorient to place, date, and situation with mod assist multimodal cues. SLP Short Term Goal 2 (Week 2): Pt will complete basic, familiar tasks wtih mod assist multimodal cues. SLP Short Term Goal 3 (Week 2): Pt will return demonstration of at least 2 safety precautions during functional tasks with mod assist multimodal cues. SLP Short Term Goal 4 (Week 2): Pt will sustain his attention to basic, familiar tasks for ~2 minute intervals with mod cues for redirection. SLP Short Term Goal 5 (Week 2): Pt will consume regular textures and thin liquids with Mod I for use of universal precautions and minimal overt s/s of aspiration.  Skilled Therapeutic Interventions:Skilled ST services focused on cognitive skills. SLP facilitated basic to complex problem solving and error awareness skills. Pt was able to complete ALFA basic money management task with supervision verbal cues for organization and ALFA mental math (mildly complex problem solving) with min A verbal cues for error awareness. Pt required initial cuing for auditory comprehension of simple deductive reasoning task fading to supervision A for inferencing. Pt refused to complete #2 deductive reasoning task, which increased in complexity. SLP suspect reduced mental flexibility and compensation for impairment in higher level problem solving skills, however pt supports baseline abilities. Pt was frustrated throughput session and required lots of encouragement to participate in treatment. Pt also expressed frustartion with length of stay and expressed " I am going to leave one way or the other" suggesting leaving AMA. SLP  provided education and support. Pt was left in room with call bell within reach. SLP recommends to continue skilled services.     Pain Pain Assessment Pain Score: 0-No pain  Therapy/Group: Individual Therapy  Timothy Case  [facility] 05/24/2020, 1:22 PM

## 2020-05-24 NOTE — Progress Notes (Signed)
Physical Therapy Session Note  Patient Details  Name: Timothy Case MRN: [id number] Date of Birth: 07/16/1955  Today's Date: 05/24/2020 PT Individual Time: [phone] and [phone] PT Individual Time Calculation (min): 57 min and 31 min  Short Term Goals: Week 2:  PT Short Term Goal 1 (Week 2): Patient will participate in standardized balance assessment with min cues for attention and behavior managment. PT Short Term Goal 2 (Week 2): Pt will ascend/descend 4 steps using B HRs with CGA. PT Short Term Goal 3 (Week 2): Patient will participate in >75% of his PT sessions for the full scheduled time. PT Short Term Goal 4 (Week 2): Patient will ambulate >150 ft with supervision for safety.  Skilled Therapeutic Interventions/Progress Updates:    Session 1: Pt received supine in bed with his wife present and pt agreeable to therapy session. Pt's wife reports pt wanted to take a shower; however, pt declines performing it with therapist stating he feels it is a "waist of therapy time" - spoke with primary OT [name] and she approved pt to take supervised showers with his wife's assistance - pt/wife in agreement with this plan and therapist reviewed a few safety measures including making sure the floor is dry prior to exiting shower and bringing the wheelchair in the bathroom if wanting to sit in there to get dressed. Notified Kierra, RN that pt is cleared to shower with wife's assistance. Supine>sitting EOB independently. Sit<>stands independently during session. Gait training ~266ft to ortho gym, no AD, with supervision for safety - wide based gait. 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  Used BITs system to target standing balance and dual-cognitive task to assess the cost on cognition under dual-task situations: Sequencing task (#,letters): completed in 3.60minutes standing then 3.33minutes in sitting Cognitive word recall task: standing able to recall 4 words with 77% accuracy and in sitting 5 words with 70% accuracy  Discussed these findings with pt indicating he has impaired word recall in standing compared to sitting (able to remember 1 fewer word) and requires increased time to complete a cognitive task in standing compared to in sitting. Pt minimally receptive to education stating that he would not have performed any higher on these items prior to his fall and that the medication, causing impaired balance, is the cause of worse results in standing. With encouragement pt able to remain sitting in chair for ~62minutes while therapist set-up the next activity - this is challenging for pt due to restlessness. Performed 1 piece of 3 different tasks in a rotating fashion (PEG board puzzle, pipe tree puzzle, and connect 4 game) - pt initially unable to recall the directions to only complete 1 piece then move to next task but after cuing able to perform correctly remainder of time - demos good ability to redirect attention to the next task and recall where he left off during the last round (even in a distracting environment) - no mistakes made on the puzzles. Pt requesting to return to room to  spend time with his wife. Gait back to room as described above. Pt left supine in bed with his wife present.   Session 2: Pt received L sidelying in bed and agreeable to therapy session. Sidelying>sitting EOB independently - upon sitting up pt reports significant HA pain. Pt reports he decided not to  take a shower with his wife and declines to participate in one at this time, stating he doesn't want to be cold. Pt visibly irritated and continues to perseverate on wanting to discharge home stating "Let me tell you what I want to do..I want you to put me through whatever you have to in order to prove that I am ready to go home" continuing to state that he will do "whatever [the therapist] needs" in order for the therapist to tell the MD on Friday, during the "meeting," that the pt is ready to D/C on Saturday. Therapist attempted to engage pt in education on multifactorial effects of TBI and focus on mobility along with cognitive and behavioral impairments - pt not receptive to education but instead redirecting the conversation and just repeating the above phrases. Sit<>stands independently during session. Gait training ~116ft 2x to/from main therapy gym, no AD, with supervision - continues to demo wide BOS. Therapist educated pt on plan to demonstrate to him a 3 exercise circuit (as written below) that he will need to recall and perform without cuing for 2 sets: 1st: 10x ball bounces into rebounder while standing on airex and naming different vehicles or manufacturers 2nd: repeated sit<>stands to/from EOM while holding bilateral 6lb dumb bells x10reps while stating items related to marlin fishing 3rd: 2 cone figure-8 forward/backwards weaving (5x in each direction) while naming his family/friends and their relation to him (picked topics that are of interest or familiar to patient to decrease dual-cognitive demand) During 1st exercise pt unable to maintain balance on airex and pt strongly voiced his feeling unsafe therefore transitioned to semi-tandem stance and pt able to complete with CGA - repeated same vehicle 2x. Of note, throughout all exercises pt unable to keep track of how many repetitions he had performed while also verbalizing the cognitive task despite therapist requesting him to do both. During  3rd exercise pt unable to recall what the task was nor what cognitive challenge he was supposed to be performing - pt states that he "was not paying attention" when therapist was demonstrating/educating him on what to do - question some validity in that statement due to pts decreased engagement in therapy but also curious if this is a deflective strategy with pt's impaired recall as noted during earlier session that he has difficulty holding onto more than 5 pieces of information at a time. Pt required CGA/min assist for balance during 3rd task with some instability going backwards. Pt states he cannot perform a 2nd set of these exercises stating "I can't do it.Marland KitchenMarland KitchenI guess you will just have to keep me here for another 3 months."  Reports increasing HA pain - pt requests for a sidelying rest break on mat. After this, pt again shows restlessness asking "what's next?" Performed 1x agility ladder drill via forward 55feet in 31feet out stepping (therapist demonstrated the task) - pt initially taking a step forward on the outside of the ladder and with cuing had some improvement but then moved towards a 67feet in 1 foot out pattern - upon making pt aware of this he states "that's the best I can do" again stating he cannot perform to his highest ability  due to the medications making him feel as though the room is moving. Therapist assessed vitals: BP 102/56 (MAP 63), HR 84bpm At end of session pt left sidelying in bed.  Therapy Documentation Precautions:  Precautions Precautions: Fall,Other (comment) Precaution Comments: impaired safety awareness Restrictions Weight Bearing Restrictions: No  Pain:   Session 1: Reports low back pain/discomfort - discussed heating pad with pt reporting ice helps more - educated pt on having nursing staff provide ice packs for pain management. Pt reports having received available medication for pain.  Session 2: Reports significant HA pain near occipital and posterior parietal region  - provided rest breaks and decreased stimulation for pain management - pt states the call bell noise in the hallway makes his HA worse (thearpist closed gym doors to dampen the sound). Offered suboccipital massage for pain management but pt declined not understanding the possible positive effects.   Therapy/Group: Individual Therapy  Ginny Forth , PT, DPT, CSRS  05/24/2020, 6:51 PM

## 2020-05-24 NOTE — Progress Notes (Signed)
Occupational Therapy Session Note  Patient Details  Name: Timothy Case MRN: [id number] Date of Birth: October 08, 1955  Today's Date: 05/24/2020 OT Individual Time: 1050-1130 OT Individual Time Calculation (min): 40 min    Short Term Goals: Week 2:  OT Short Term Goal 1 (Week 2): Pt will verbalize 1 defict from TBI to demo improved awareness OT Short Term Goal 2 (Week 2): Pt will terminate bathing sequence appropriately with no VC OT Short Term Goal 3 (Week 2): Pt will complete toileting with S OT Short Term Goal 4 (Week 2): Pt will complete dual task with min cuing to demo improved processing  Skilled Therapeutic Interventions/Progress Updates:    Pt received sitting EOB with RN present with med pass. Pt agitated re mod I status in room and wondering why he cannot also freely ambulate in the hallway. Discussed openly need to have a way to ensure pt does not make it past the double doors without staff knowing. Agitation increasing with obvious poor awareness re risks of elopement pt stating "nothing is wrong with me". Pt with multiple perseverative complaints re hospital staff and care, was able to redirect to tasks at hand. Pt strongly opposed to taking shower despite edu that he was angry when  He previously did not receive a shower. Pt completed ambulatory transfer to the therapy gym. 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 After several minutes he complained of a headache and dizziness and asked to return to his room. He sat in his recliner and was able to attend to task for 15 min with only min cueing. He required min A overall for assist with worksheet with 17/20 correct and more than reasonable time required. Pt was left sitting in recliner with all needs met.   Therapy Documentation Precautions:  Precautions Precautions: Fall,Other (comment) Precaution Comments: impaired safety awareness Restrictions Weight Bearing  Restrictions: No  Therapy/Group: Individual Therapy  Curtis Sites 05/24/2020, 6:24 AM

## 2020-05-24 NOTE — Progress Notes (Signed)
Physical Therapy Session Note  Patient Details  Name: Timothy Case MRN: [id number] Date of Birth: 03/16/1956  Today's Date: 05/24/2020 PT Individual Time: 0900-1000 PT Individual Time Calculation (min): 60 min   Short Term Goals: Week 2:  PT Short Term Goal 1 (Week 2): Patient will participate in standardized balance assessment with min cues for attention and behavior managment. PT Short Term Goal 2 (Week 2): Pt will ascend/descend 4 steps using B HRs with CGA. PT Short Term Goal 3 (Week 2): Patient will participate in >75% of his PT sessions for the full scheduled time. PT Short Term Goal 4 (Week 2): Patient will ambulate >150 ft with supervision for safety.  Skilled Therapeutic Interventions/Progress Updates:     Patient ambulating to the bathroom upon PT arrival, patient is independent in the room, cleared by therapy yesterday. Patient alert and agreeable to PT session. Patient denied pain during session, however, continues to report feeling "drugged" reports that he was given a medication ~7am that seems to make him feel this way, RN made aware. Patient reports feeling "off balance," "high," and "like I'm going to pass out," during session.   Vitals:  Sitting: BP 112/78, HR 90 Standing: BP 112/80, HR 91  Patient with more appropriate behaviors throughout session. Did express concerns about staff interactions that increase his level of agitation and appropriately requested that one staff member be switched to avoid these behaviors. He also complimented 2 of his nurses and his therapists on their interactions with him and stated that he was aware of his poor behavior with them previously and appreciated how these staff members have interacted with him at these times.  Patient ambulated to/from the day room with supervision for cues. Ambulated with decreased gait speed, decreased step length and height, and increased BOS. Provided cues for reduced BOS, increased arm swing for  improved gait speed and balance, and increased step height for safety. Patient discussed his work out routine with his training PTA. Reports performing side planks, kick outs, bench press, hand weights, and 5 min on rowing machine or elliptical. Patient agreeable to trial modified versions of these exercises to progress back to his work out routine. Performed side plank in standing against a wall and progressed to lateral trunk lifts in standing side plank R/L. Patient frustrated after, stating "I am too drugged for this." Spend increased time redirecting patient and discussing symptoms he is experiencing, see above. Patient then requested to return to the room to do therapy that is not in standing.   Initiated TBI education in the room. Patient initially receptive, then stated "I talked to that new doctor about all of this yesterday." Used teach back method for patient to recall education discussed with Dr. Kieth Brightly. Patient able to recall 4 deficits discussed and relate these to behaviors he has had since his fall, but denies that he is having them currently.   Patient in bed at end of session with breaks locked and all needs within reach.    Therapy Documentation Precautions:  Precautions Precautions: Fall,Other (comment) Precaution Comments: impaired safety awareness Restrictions Weight Bearing Restrictions: No   Therapy/Group: Individual Therapy  Ifeoma Vallin L Deavin Forst PT, DPT  05/24/2020, 1:44 PM

## 2020-05-24 NOTE — Consult Note (Signed)
  05/24/2020: 1 PM: Patient was erroneously placed in my schedule again today after seeing him yesterday.  As the patient's wife was also there I did go ahead and talk with the patient and his wife briefly.  Patient continues to have significant impulse control issues.  Expressive and receptive language is quite good but the patient continues to ruminate on a number of variables like his insistence to go home.  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  Family described this as being very unusual for the patient premorbidly.  Patient is very steady walking and is not at risk of falling but continues to complain about impacts of early morning medicines potentially Keppra or pain medicines causing him groggy as the reason for not doing therapeutic interventions first thing in the morning.  Today, the patient was alert and animated and clearly not having any type of sedated effects.  Patient continues to be unable to shift attention and continues to get fixated and is very impulsive and responding to what ever first thoughts come into his head.  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

## 2020-05-24 NOTE — Plan of Care (Signed)
  Problem: Consults Goal: RH BRAIN INJURY PATIENT EDUCATION Description: Description: See Patient Education module for eduction specifics Outcome: Progressing Goal: Skin Care Protocol Initiated - if Braden Score 18 or less Description: If consults are not indicated, leave blank or document N/A Outcome: Progressing Goal: Nutrition Consult-if indicated Outcome: Progressing   Problem: RH BOWEL ELIMINATION Goal: RH STG MANAGE BOWEL WITH ASSISTANCE Description: STG Manage Bowel with mod I Assistance. Outcome: Progressing Goal: RH STG MANAGE BOWEL W/MEDICATION W/ASSISTANCE Description: STG Manage Bowel with Medication with mod I Assistance. Outcome: Progressing   Problem: RH BLADDER ELIMINATION Goal: RH STG MANAGE BLADDER WITH ASSISTANCE Description: STG Manage Bladder With mod I Assistance Outcome: Progressing   Problem: RH SKIN INTEGRITY Goal: RH STG SKIN FREE OF INFECTION/BREAKDOWN Outcome: Progressing Goal: RH STG MAINTAIN SKIN INTEGRITY WITH ASSISTANCE Description: STG Maintain Skin Integrity With mod I Assistance. Outcome: Progressing   Problem: RH SAFETY Goal: RH STG ADHERE TO SAFETY PRECAUTIONS W/ASSISTANCE/DEVICE Description: STG Adhere to Safety Precautions With min Assistance/Device. Outcome: Progressing   Problem: RH COGNITION-NURSING Goal: RH STG USES MEMORY AIDS/STRATEGIES W/ASSIST TO PROBLEM SOLVE Description: STG Uses Memory Aids/Strategies With min Assistance to Problem Solve. Outcome: Progressing Goal: RH STG ANTICIPATES NEEDS/CALLS FOR ASSIST W/ASSIST/CUES Description: STG Anticipates Needs/Calls for Assist With min Assistance/Cues. Outcome: Progressing   Problem: RH PAIN MANAGEMENT Goal: RH STG PAIN MANAGED AT OR BELOW PT'S PAIN GOAL Description: Pain scale <4/10 Outcome: Progressing   Problem: RH KNOWLEDGE DEFICIT BRAIN INJURY Goal: RH STG INCREASE KNOWLEDGE OF SELF CARE AFTER BRAIN INJURY Outcome: Progressing   Problem: Safety: Goal: Non-violent  Restraint(s) Outcome: Progressing   

## 2020-05-25 NOTE — Progress Notes (Signed)
Speech Language Pathology Daily Session Note  Patient Details  Name: Niel Peretti MRN: [id number] Date of Birth: 1955-10-20  Today's Date: 05/25/2020 SLP Individual Time: 1030-1110 SLP Individual Time Calculation (min): 40 min  Short Term Goals: Week 2: SLP Short Term Goal 1 (Week 2): Pt will utilize external aids to reorient to place, date, and situation with mod assist multimodal cues. SLP Short Term Goal 2 (Week 2): Pt will complete basic, familiar tasks wtih mod assist multimodal cues. SLP Short Term Goal 3 (Week 2): Pt will return demonstration of at least 2 safety precautions during functional tasks with mod assist multimodal cues. SLP Short Term Goal 4 (Week 2): Pt will sustain his attention to basic, familiar tasks for ~2 minute intervals with mod cues for redirection. SLP Short Term Goal 5 (Week 2): Pt will consume regular textures and thin liquids with Mod I for use of universal precautions and minimal overt s/s of aspiration.  Skilled Therapeutic Interventions: Pt was seen for skilled ST targeting cognitive goals. Pt received in recliner. Although he continues to focus on going home and demonstrates very limited ability to recognize cognitive deficits and how targeted activities can apply to his daily functioning, he was most participatory than previous encounters with this therapist. He sustained his attention to tasks with overall Min A verbal cues for redirection. During a semi-complex PEG board task, he required only Supervision A verbal cues for problem solving and error awareness. During a semi-complex monthly calendar and scheduling task, he required increased Min A verbal and visual cues for problem solving and error awareness, and became someone frustrated when he realized he had made errors and with SLPs attempts to assist him in how to correct them. Pt with once instance of impulsivity, where he urgently stood from chair to ambulate to bathroom without verbal  warning, however no loss of balance or other unsafe behaviors noted during ambulation. Pt reported headache, therefore SLP assisted pt in calling RN to request pain medication. Pt left sitting in recliner with needs met to his satisfaction (alarm off, given that pt is cleared to ambulate within his room Mod I). Continue per current plan of care.       Pain Pain Assessment Pain Scale: Faces Faces Pain Scale: Hurts a little bit Pain Type: Acute pain Pain Location: Head Pain Descriptors / Indicators: Headache Pain Onset: On-going Pain Intervention(s): RN made aware Multiple Pain Sites: No  Therapy/Group: Individual Therapy  Arbutus Leas 05/25/2020, 7:20 AM

## 2020-05-25 NOTE — Progress Notes (Signed)
Physical Therapy Session Note  Patient Details  Name: Timothy Case MRN: [id number] Date of Birth: 02/25/1956  Today's Date: 05/25/2020 PT Individual Time: 0940-1035 PT Individual Time Calculation (min): 55 min   Short Term Goals: Week 2:  PT Short Term Goal 1 (Week 2): Patient will participate in standardized balance assessment with min cues for attention and behavior managment. PT Short Term Goal 2 (Week 2): Pt will ascend/descend 4 steps using B HRs with CGA. PT Short Term Goal 3 (Week 2): Patient will participate in >75% of his PT sessions for the full scheduled time. PT Short Term Goal 4 (Week 2): Patient will ambulate >150 ft with supervision for safety.  Skilled Therapeutic Interventions/Progress Updates:     Patient sitting EOB with his wife in the room upon PT arrival. Patient alert and agreeable to PT session. Patient reported 5/10 low back pain and headache during session, RN made aware. PT provided repositioning, rest breaks, and distraction as pain interventions throughout session.   Patient verbose throughout session with intermittent verbal agitation/poor frustration tolerance with hospital policies and accomodation. Notes ongoing low back pain, reports cause is the hospital bed. Notes that he has chronic back pain that is treated by massage therapy and physical therapy from his personal trainer. Performed lumbar flexion x10 with increased pain and lumbar extension in standing with reduced pain. Reports pain is better with mobility and worse with lying and transitioning to standing/sitting. Educated on use of lumbar extension repeated motion exercises for pain management. Performed standing modified hamstring stretch with flat back reaching forward with B hands on the bed x30 sec. Patient spontaneously performed gastroc stretch R/L x10 sec after. Patient required increased time to redirect to exercises as he continued to express concerns about the hospital bed and  planning to go home tomorrow. Informed patient that the rehab team had not decided that the patient would be going home tomorrow due to safety concerns and need for family education and preparation for d/c. Patient dismissed PT's education and preceded to talk about going to [place]. 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 Patient minimally receptive. Patient declined ambulation or other therapeutic activity outside of the room due to back pain this session, but did participate in the full session in his room. Patient ambulated and performed transfers in the room independently throughout session without LOB or safety concerns.   Patient sitting in the recliner with his wife in the room at end of session with breaks locked and all needs within reach.    Therapy Documentation Precautions:  Precautions Precautions: Fall,Other (comment) Precaution Comments: impaired safety awareness Restrictions Weight Bearing Restrictions: No   Therapy/Group: Individual Therapy  Dymir Neeson L Tenzin Pavon PT, DPT  05/25/2020, 12:49 PM

## 2020-05-25 NOTE — Progress Notes (Signed)
Patient ID: Timothy Case, male   DOB: [birth year], 65 y.o.   MRN: [id number]   Patient/Family Conference  Patient/family in attendance: Wife [name], and son Teacher, music in attendance: Blanch Media (OT), Serina Cowper (PT), Feliberto Gottron (SLP), Dr. Ivory Broad (attending), Cecile Sheerer (SW)  Main focus: review patient gains here in rehab, expectations at time of discharge, and discharge recommendations.  Synopsis of information shared: patient currently at an overall supervision level of care. Therapist continue to work with patient on cognition/executive functioning, awareness, concentration, carryover, balance, and sequencing. Pt will require 24/7 care at discharge due to cognition, and pt is a moderate fall risk.   Barriers/concerns expressed by patient and family: Family concerns include pt behaviors, and ongoing therapies needed.  Patient/family response: family in agreement with outpatient therapies, and following up with physician in clinic to continue to monitor pt behaviors.   Follow-up/action plans: SW to schedule outpatient therapies at [facility], and outpatient neuropsych referral will be submitted.   Cecile Sheerer, MSW, LCSWA Office: [phone] Cell: [phone] Fax: [phone]

## 2020-05-25 NOTE — Progress Notes (Signed)
[facility] PHYSICAL MEDICINE & REHABILITATION PROGRESS NOTE   Subjective/Complaints: Had a very good night. Slept 6 hours. At EOB when I came in. Recalled events around his injury (?3 separate falls). Discussed a lot of biographical information, etc as well  ROS: Patient denies fever, rash, sore throat, blurred vision, nausea, vomiting, diarrhea, cough, shortness of breath or chest pain, joint or back pain, headache, or mood change.   Objective:   No results found. No results for input(s): WBC, HGB, HCT, PLT in the last 72 hours. Recent Labs    05/22/20 1353  NA 133*  K 4.8  CL 96*  CO2 24  GLUCOSE 122*  BUN 23  CREATININE 1.14  CALCIUM 8.9    Intake/Output Summary (Last 24 hours) at 05/25/2020 0853 Last data filed at 05/24/2020 1855 Gross per 24 hour  Intake 380 ml  Output --  Net 380 ml        Physical Exam: Vital Signs Blood pressure 100/76, pulse 74, temperature 97.7 F (36.5 C), temperature source Oral, resp. rate 18, height 5' 9.02" (1.753 m), weight 95.7 kg, SpO2 99 %. Constitutional: No distress . Vital signs reviewed. HEENT: EOMI, oral membranes moist, bruising resolving Neck: supple Cardiovascular: RRR without murmur. No JVD    Respiratory/Chest: CTA Bilaterally without wheezes or rales. Normal effort    GI/Abdomen: BS +, non-tender, non-distended Ext: no clubbing, cyanosis, or edema Psych: pleasant and cooperative, non-agitated. A little verbose/tangential Musc: No edema in extremities.  No tenderness in extremities. Neurologic oriented to person, place, follows basic commands Motor: 4+ to 5/5.excellent sitting balance. Transferred without assistance from EOB.    Assessment/Plan: 1. Functional deficits which require 3+ hours per day of interdisciplinary therapy in a comprehensive inpatient rehab setting.  Physiatrist is providing close team supervision and 24 hour management of active medical problems listed below.  Physiatrist and rehab team  continue to assess barriers to discharge/monitor patient progress toward functional and medical goals  Care Tool:  Bathing    Body parts bathed by patient: Right arm,Left arm,Chest,Abdomen,Right upper leg,Left upper leg,Face   Body parts bathed by helper: Front perineal area,Buttocks,Right lower leg,Left lower leg     Bathing assist Assist Level: Moderate Assistance - Patient 50 - 74%     Upper Body Dressing/Undressing Upper body dressing   What is the patient wearing?: Hospital gown only,Pull over shirt    Upper body assist Assist Level: Minimal Assistance - Patient > 75%    Lower Body Dressing/Undressing Lower body dressing      What is the patient wearing?: Incontinence brief     Lower body assist Assist for lower body dressing: Minimal Assistance - Patient > 75%     Toileting Toileting    Toileting assist Assist for toileting: Independent Assistive Device Comment: Urinal   Transfers Chair/bed transfer  Transfers assist     Chair/bed transfer assist level: Independent     Locomotion Ambulation   Ambulation assist      Assist level: Supervision/Verbal cueing Assistive device: No Device Max distance: 230ft   Walk 10 feet activity   Assist     Assist level: Supervision/Verbal cueing Assistive device: No Device   Walk 50 feet activity   Assist    Assist level: Supervision/Verbal cueing Assistive device: No Device    Walk 150 feet activity   Assist    Assist level: Supervision/Verbal cueing Assistive device: No Device    Walk 10 feet on uneven surface  activity   Assist Walk 10 feet  on uneven surfaces activity did not occur: Safety/medical concerns   Assist level: Supervision/Verbal cueing Assistive device: Other (comment) (intermittent use of rail)   Wheelchair     Assist Will patient use wheelchair at discharge?: No             Wheelchair 50 feet with 2 turns activity    Assist            Wheelchair  150 feet activity     Assist          Blood pressure 100/76, pulse 74, temperature 97.7 F (36.5 C), temperature source Oral, resp. rate 18, height 5' 9.02" (1.753 m), weight 95.7 kg, SpO2 99 %.    Medical Problem List and Plan: 1.TBI/SDH/nondisplaced right occipital bone fracturesecondary tounwitnessed fall 05/04/2020  Continue CIR PT, OT, SLP -- family conference today.   -has made nice progress behaviorally and cognitively this week  -ELOS 06/02/20 2. Antithrombotics: -DVT/anticoagulation:Subcutaneous heparin 05/08/2020 -antiplatelet therapy: N/A 3. Pain Management:Oxycodone as needed  Appears controlled on 2/6 4. Mood/sleep:Melatonin 5 mg nightly as needed. -continue sleep chart -antipsychotic agents: seroquel  2/9 continue seroquel 100mg  qhs with 25mg  scheduled in morning   -seroquel 50mg  prn for moderate agitation   -propranolol XR 120mg  daily   -IM Ativan for severe agitation   -maintain behavioral plan    -reduction of monitoring/restraints has helped 5. Neuropsych: This patientis notcapable of making decisions on hisown behalf. 6. Skin/Wound Care:Routine skin checks 7. Fluids/Electrolytes/Nutrition: -labs ok 2/7 8. Seizure prophylaxis. Keppra 1000 mg mg twice daily  Decreased to 750mg  bid on 2/3, reduce to 500mg  bid with PM dose 2/7  2/10 continue same dosing today 9. Atrial fibrillation with RVR. Intravenous Cardizem discontinued. dc Coreg 25 mg as below, start propranolol for behavioral benefits Controlled on 2/10 10. Hypertension.   Vitals:   05/25/20 0411 05/25/20 0824  BP: 99/71 100/76  Pulse: 73 74  Resp: 18   Temp: 97.7 F (36.5 C)   SpO2: 99%     Continue propranolol in place of coreg--change to XR form  continue norvasc  lisinopril 10mg  added  Good control 2/10 11. Decreased nutritional storage/Dysphagia:  Diet has been advanced to regular.  12. History of  alcohol use. Monitor for any signs of withdrawal 13. Constipation. MiraLAX as directed. Adjust bowel program as needed.  Bowel meds increased on 2/4  Had bm 2/8 14. History recent Covid March and then again December 2021. Rapid antigen test was negative. It was felt that Covid positive PCR test indicated dead virus shedding rather than active infection and isolation discontinued. 15.  Hyponatremia  Sodium 133 on 2/7   Continue to monitor  LOS: 13 days A FACE TO FACE EVALUATION WAS PERFORMED  07/23/20 05/25/2020, 8:53 AM

## 2020-05-25 NOTE — Progress Notes (Addendum)
Occupational Therapy Session Note  Patient Details  Name: Timothy Case MRN: [id number] Date of Birth: March 10, 1956  Today's Date: 05/25/2020 OT Individual Time: 1345-1430 OT Individual Time Calculation (min): 45 min    Short Term Goals: Week 1:  OT Short Term Goal 1 (Week 1): Pt will maintain arousal for >30 min to participate in tx session consistently OT Short Term Goal 1 - Progress (Week 1): Met OT Short Term Goal 2 (Week 1): pt will transfer to toilet wiht MIN A OT Short Term Goal 2 - Progress (Week 1): Met OT Short Term Goal 3 (Week 1): Pt will thread BLE into pants OT Short Term Goal 3 - Progress (Week 1): Met OT Short Term Goal 4 (Week 1): Pt will sequence oral care with MOD VC OT Short Term Goal 4 - Progress (Week 1): Met  Skilled Therapeutic Interventions/Progress Updates:    1:1. Pt received in bed agreeable to OT. Pt and wife present at beginning of session. Pt continues to complain of back pain, however not receptive to stretching or positioning changes for in bed at this time. Pt trials 2x to complete 50 second reaction timing test on BITS; however pt does not pass despite 2 trials. Pt reporting "its hard because I cant see out of my left eye" and "I want to see you do it." OT completes and passes first level. OT redirects to walking while talking assessment. Pt requires 13.1 second to walk 40' total without talking. Administered the WWT complex where pt required to walk same distance while alternating numbers and letters. Pt instructed to pay equal attention to walking and talking. Pt made it to 4-D, however after unable to maintain accurate letter pattern jumping from G, to W, to Z with no knowledge of errors. PT requried 21.2 seconds to complete walking 40 feet with cognitive component. 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 I do not believe I am any different than I would have been prior to the  accident." Next trial OT recommend record pt to play back for pt to hear his errors. Pt redirected to third task of alternating attention: given sequence of exercises (chest press, bicep curl, and overhead press) complete 1x10 of exercises, grab a scrabble tile, walk it over to the board and spell out "marlin" alternating between exercise and placing the scrabble tile. Pt with 1 error  Grabbing the letter L 2x, however able to notice once at [place] board. Pt able to track all repetitions and keep sequence of exercises correctly. Exited session with pt seated in bed, cool pack provided for back pain, wife present and call light in reach   Therapy Documentation Precautions:  Precautions Precautions: Fall,Other (comment) Precaution Comments: impaired safety awareness Restrictions Weight Bearing Restrictions: No General:   Vital Signs: Therapy Vitals Temp: 97.7 F (36.5 C) Temp Source: Oral Pulse Rate: 73 Resp: 18 BP: 99/71 Patient Position (if appropriate): Lying Oxygen Therapy SpO2: 99 % O2 Device: Room Air Pain: Pain Assessment Pain Scale: 0-10 Pain Score: 5  Pain Location: Head Pain Intervention(s): Medication (See eMAR) ADL: ADL Grooming: Minimal assistance Where Assessed-Grooming: Edge of bed (sitting balance d/t arousal) Upper Body Bathing: Minimal assistance Where Assessed-Upper Body Bathing: Edge of bed (sitting balance A) Lower Body Bathing: Moderate assistance Where Assessed-Lower Body Bathing: Edge of bed Upper Body Dressing: Minimal assistance Where Assessed-Upper Body Dressing: Edge of bed Lower Body Dressing: Maximal assistance Where Assessed-Lower  Body Dressing: Edge of bed Toileting: Maximal assistance Where Assessed-Toileting: Bedside Commode Toilet Transfer: Moderate assistance Toilet Transfer Method: Stand pivot Toilet Transfer Equipment: Systems analyst    Praxis   Exercises:   Other Treatments:     Therapy/Group: Individual  Therapy  [name] 05/25/2020, 6:52 AM

## 2020-05-25 NOTE — Progress Notes (Signed)
Sleeping chart in progress. Pt sleept total 6 hours. No complains, no agitation. Pt is calm and cooperative.

## 2020-05-25 NOTE — Progress Notes (Signed)
Patient ID: Timothy Case, male   DOB: 15-Mar-1956, 65 y.o.   MRN: [id number]  Sw faxed outpatient PT/OT/SLP referral to [facility] ([phone]/f:[phone]).  Cecile Sheerer, MSW, LCSWA Office: [phone] Cell: [phone] Fax: [phone]

## 2020-05-25 NOTE — Progress Notes (Signed)
Occupational Therapy Session Note  Patient Details  Name: Timothy Case MRN: [id number] Date of Birth: 11/08/1955  Today's Date: 05/25/2020 OT Individual Time: 1500-1535 OT Individual Time Calculation (min): 35 min     Short Term Goals: Week 2:  OT Short Term Goal 1 (Week 2): Pt will verbalize 1 defict from TBI to demo improved awareness OT Short Term Goal 2 (Week 2): Pt will terminate bathing sequence appropriately with no VC OT Short Term Goal 3 (Week 2): Pt will complete toileting with S OT Short Term Goal 4 (Week 2): Pt will complete dual task with min cuing to demo improved processing  Skilled Therapeutic Interventions/Progress Updates:    Patient seated edge of bed, wife present at start of session.  He notes ongoing pain in lower back - reviewed positioning and use of pillows in various positions in bed.  Provided options for stretching.  He demonstrates good safety with mobility in room, independent use of bathroom and basic grooming tasks.  Completed long walk on unit with CS - no LOB or unsafe behavior observed.  He verbalizes understanding of agitated behavior and states that he feels this has improved in the past few days.  Discussed potential residual deficits after brain injury, although improved awareness demonstrated he continues with some impaired insight into deficits.  He returned to room with supervision and remained in room at close of session.  Call bell in reach.    Therapy Documentation Precautions:  Precautions Precautions: Fall,Other (comment) Precaution Comments: impaired safety awareness Restrictions Weight Bearing Restrictions: No  Therapy/Group: Individual Therapy  Barrie Lyme 05/25/2020, 7:49 AM

## 2020-05-25 NOTE — Progress Notes (Signed)
Nutrition Follow-up  DOCUMENTATION CODES:   Not applicable  INTERVENTION:   -continue Ensure Enlive TID, each supplement provides 350 kcal, 20 grams protein   -continue Magic Cup TID with meals, each supplement provides 290 kcal, 9 grams protein   -continue MVI with minerals, daily  NUTRITION DIAGNOSIS:   Increased nutrient needs related to acute illness (Traumatic Brain Injury) as evidenced by estimated needs.  GOAL:   Patient will meet greater than or equal to 90% of their needs  Ongoing.   MONITOR:   PO intake,Supplement acceptance,Labs,Weight trends,Skin,I & O's  REASON FOR ASSESSMENT:   Consult Enteral/tube feeding initiation and management  ASSESSMENT:   Patient with PMH significant for GERD, HLD, HTN, CVA, and recent COVID infection. Admitted to [facility] for bilateral SDH, diffuse SAH, bilateral frontal contusions, and mall left cerebellar hemorrhagic contusion. Transferred to [facility] for comprehensive rehab program.  Pt has been consuming approximately 60-100% of meals since 02/07. Pt complained to intern about his meals. Pt mentioned that he did not have many options on the menu that he enjoyed. Pt did have some outside foods in his room which he told intern and RD that he had been eating in addition to his trays.   Pt discussed with intern that he has been enjoying Ensure [employer] and [employer] supplement. Pt agreed to continue consuming these ONS. Intern reiterated the importance of added protein and calories to aid in his healing process.   [facility] admit weight: 103.4 kg  Weight (last RD note - 02/03) - 99.2 kg  Current weight: 95.7 kg  Pt's weights reviewed noting pt's weights have been inconsistent since 01/28. Noted weight loss of 7.7 kg since admission to [facility]. This is a 12% weight loss in 2 weeks which is severe and significant for this timeframe. Pt has been experiencing edema per RN note, which could be partly contributing to the inconsistent weights.   Intern  discussed weight loss with pt. Pt told intern he has lost weight due to not eating his food, mentioning the trays are not aligning with his food preferences. He also mentioned that his weight loss was intentional as he felt he has needed to lose weight. RD discussed with pt that extreme weight loss is not beneficial for him during his hospital stay since it is crucial to preserve lean body mass.   I&Os Reviewed.   Meds Reviewed: Ensure Enlive TID, Miralax (17 g, QID), Senekot - S (1 tablet, BID)  Labs Reviewed: Sodium (133 mg/dL)m Chloride (96 mmol/L), Glucose (112 mg/dL)  NUTRITION - FOCUSED PHYSICAL EXAM:  Flowsheet Row Most Recent Value  Orbital Region Mild depletion  Upper Arm Region No depletion  Thoracic and Lumbar Region No depletion  Buccal Region No depletion  Temple Region No depletion  Clavicle Bone Region No depletion  Clavicle and Acromion Bone Region No depletion  Scapular Bone Region No depletion  Dorsal Hand No depletion  Patellar Region No depletion  Anterior Thigh Region No depletion  Posterior Calf Region No depletion  Edema (RD Assessment) None  Hair Reviewed  Eyes Reviewed  Mouth Reviewed  Skin Reviewed  Nails Reviewed       Diet Order:   Diet Order            Diet regular Room service appropriate? Yes; Fluid consistency: Thin  Diet effective now                 EDUCATION NEEDS:   No education needs have been identified at this  time  Skin:  Skin Assessment: Reviewed RN Assessment  Last BM:  05/23/20  Height:   Ht Readings from Last 1 Encounters:  05/12/20 5' 9.02" (1.753 m)    Weight:   Wt Readings from Last 1 Encounters:  05/25/20 95.7 kg    Ideal Body Weight:  72.7 kg  BMI:  Body mass index is 31.14 kg/m.  Estimated Nutritional Needs:   Kcal:  2200-2400 kcal  Protein:  110-125 grams  Fluid:  >/= 2 L/day    John Giovanni, Dietetic Intern 05/25/2020 2:59 PM

## 2020-05-26 MED ORDER — LEVETIRACETAM 250 MG PO TABS
250.0000 mg | ORAL_TABLET | Freq: Two times a day (BID) | ORAL | Status: DC
  Administered 2020-05-26 – 2020-05-29 (×6): 250 mg via ORAL
  Filled 2020-05-26 (×6): qty 1

## 2020-05-26 MED ORDER — TRAMADOL HCL 50 MG PO TABS
50.0000 mg | ORAL_TABLET | Freq: Four times a day (QID) | ORAL | Status: DC | PRN
  Administered 2020-05-27 – 2020-05-28 (×3): 50 mg via ORAL
  Filled 2020-05-26 (×3): qty 1

## 2020-05-26 MED ORDER — ROSUVASTATIN CALCIUM 20 MG PO TABS
20.0000 mg | ORAL_TABLET | Freq: Every day | ORAL | Status: DC
  Administered 2020-05-27 – 2020-05-30 (×4): 20 mg via ORAL
  Filled 2020-05-26 (×4): qty 1

## 2020-05-26 NOTE — Progress Notes (Signed)
Occupational Therapy Weekly Progress Note  Patient Details  Name: Timothy Case MRN: [id number] Date of Birth: 1955-04-17  Beginning of progress report period: May 19, 2020 End of progress report period: May 26, 2020  Today's Date: 05/26/2020 OT Individual Time: 1100-1200 OT Individual Time Calculation (min): 60 min    Patient has met 2 of 4 short term goals.  Pt has made steady progress towards OT goals demonstrating [place]o level VI behaviors. Pt agitation has significantly improved this week with gradual decreased use of alarms and restraints. Pt currently performs ADLs at supervision level, however pt continues to demo deficits in awareness, insight into implications of deficits on safety/function, dual task processing and higher level attention. Pt would benefit from skilled OT to improve safety, independence and participation in ADL/IADL performance at home  Patient continues to demonstrate the following deficits: muscle weakness, decreased cardiorespiratoy endurance, decreased coordination, decreased attention, decreased awareness, decreased problem solving, decreased safety awareness and decreased memory and decreased standing balance, decreased postural control and decreased balance strategies and therefore will continue to benefit from skilled OT intervention to enhance overall performance with BADL, iADL and Reduce care partner burden.  Patient progressing toward long term goals..  Continue plan of care.  OT Short Term Goals Week 2:  OT Short Term Goal 1 (Week 2): Pt will verbalize 1 defict from TBI to demo improved awareness OT Short Term Goal 1 - Progress (Week 2): Progressing toward goal OT Short Term Goal 2 (Week 2): Pt will terminate bathing sequence appropriately with no VC OT Short Term Goal 2 - Progress (Week 2): Met OT Short Term Goal 3 (Week 2): Pt will complete toileting with S OT Short Term Goal 3 - Progress (Week 2): Met OT Short Term Goal 4  (Week 2): Pt will complete dual task with min cuing to demo improved processing OT Short Term Goal 4 - Progress (Week 2): Progressing toward goal Week 3:  OT Short Term Goal 1 (Week 3): STG=LTG d/t ELOS  Skilled Therapeutic Interventions/Progress Updates:    1:1. Pt received in recliner agreeable to OT but frustrated about DC date. Pt able to gently be redirected to recalling tasks of the day stating, "the pills were the hardest thing ive done since ive been here. Im joking." Pt then states that he will not be taking pain medications at home since he reports a history of substance abuse. Attempted to facilitate discussion of different medications may need to be taken in multiple times a day how would pt use memory aides to recall when to take medications. Pt states, "If I need to take something multiple times a day I wont take it very long." Educated on importance of taking medication as they are prescribed and having wife assist with checking for errors. Pt disregarded and deflected education. Pt also attempted to justify poor performance on WWT complex test with, "I think in spanish" and attempted to demonstrate alternating numbers/letters in spanish, however only states numbers telling this clinician "you heard wrong."   Pt ambulates throughout session with S and 1 instance of LOB anteriorly stopping suddenly to tie shoe requiring MIN A for balance. Pt reporting feeling of blood rushing to head with shoe tying however doesn't feel this during cleaning up cornhole activity. Pt able to keep track of first 2 rounds however cleaned up before calculating third round and stated incorrect score. Pt completes ambulation, bouncing ball and naming animals in alphabetical order. Pt required to stop each time to think to  find animal. When OT pointed that out pt able to continue walking and bouncing ball required 12-20 steps to think of animal name. Pt reporting that, "I wasn't good at coordination things prior to  this" as deflection for poor performance of task. Exited session with pt seated in bed, exit alarm on and call light in reach   Therapy Documentation Precautions:  Precautions Precautions: Fall,Other (comment) Precaution Comments: impaired safety awareness Restrictions Weight Bearing Restrictions: No General:   Vital Signs: Therapy Vitals Temp: 97.7 F (36.5 C) Temp Source: Oral Pulse Rate: 81 Resp: 18 BP: (!) 124/92 Patient Position (if appropriate): Sitting Oxygen Therapy SpO2: 100 % O2 Device: Room Air Pain: Pain Assessment Pain Scale: 0-10 Pain Score: 7  Pain Location: Head Pain Intervention(s): Medication (See eMAR) ADL: ADL Grooming: Minimal assistance Where Assessed-Grooming: Edge of bed (sitting balance d/t arousal) Upper Body Bathing: Minimal assistance Where Assessed-Upper Body Bathing: Edge of bed (sitting balance A) Lower Body Bathing: Moderate assistance Where Assessed-Lower Body Bathing: Edge of bed Upper Body Dressing: Minimal assistance Where Assessed-Upper Body Dressing: Edge of bed Lower Body Dressing: Maximal assistance Where Assessed-Lower Body Dressing: Edge of bed Toileting: Maximal assistance Where Assessed-Toileting: Bedside Commode Toilet Transfer: Moderate assistance Toilet Transfer Method: Stand pivot Toilet Transfer Equipment: Systems analyst    Praxis   Exercises:   Other Treatments:     Therapy/Group: Individual Therapy  [name] 05/26/2020, 6:43 AM

## 2020-05-26 NOTE — Progress Notes (Signed)
Speech Language Pathology Daily Session Note  Patient Details  Name: Timothy Case MRN: [id number] Date of Birth: Feb 20, 1956  Today's Date: 05/26/2020 SLP Individual Time: 8144-8185 SLP Individual Time Calculation (min): 60 min  Short Term Goals: Week 2: SLP Short Term Goal 1 (Week 2): Pt will utilize external aids to reorient to place, date, and situation with mod assist multimodal cues. SLP Short Term Goal 2 (Week 2): Pt will complete basic, familiar tasks wtih mod assist multimodal cues. SLP Short Term Goal 3 (Week 2): Pt will return demonstration of at least 2 safety precautions during functional tasks with mod assist multimodal cues. SLP Short Term Goal 4 (Week 2): Pt will sustain his attention to basic, familiar tasks for ~2 minute intervals with mod cues for redirection. SLP Short Term Goal 5 (Week 2): Pt will consume regular textures and thin liquids with Mod I for use of universal precautions and minimal overt s/s of aspiration.  Skilled Therapeutic Interventions: Skilled treatment session focused on cognitive goals. SLP facilitated session by providing Mod A verbal cues for recall of his current medications and their functions. Patient reported he uses a pill box at home but required overall Mod A verbal cues for complex problem solving while organizing a BID pill box regarding identifying what side of the pill box was AM/PM and asked "how do I load this." However, patient continues to demonstrate poor awareness of deficits and reported difficulty was due to utilizing an unfamiliar pill box. Throughout session, patient continues to perseverate on going home and reporting multiple "issues" with his room, plan of care, etc. However, patient is must more redirectable with decreased agitation. Patient left upright sitting EOB. Continue with current plan of care.      Pain Pain Assessment Pain Scale: 0-10 Pain Score: 6  Pain Descriptors / Indicators: Headache Pain  Intervention(s): Medication (See eMAR)  Therapy/Group: Individual Therapy  Eldora Napp 05/26/2020, 3:08 PM

## 2020-05-26 NOTE — Consult Note (Addendum)
05/26/2020: 10:45 AM to 10:55 AM  I briefly spoke with the patient as I promised to have a conversation with him today after our visit on Wednesday.  The patient had entered a verbal contract to more fully participate in therapeutic interventions to assess the degree of improvement and we would readdress this as he has continually stated and ruminated and actively fixated on his desire for immediate discharge.  Today, while the patient has continued to talk about wanting to be discharged and the reason being issues such as noisy room, his inability to stay in the 4 walls etc. he was much more appropriate and able to organize thoughts more clearly and stay focused on the task at hand.  He acknowledged his increased efforts to participate in therapies and did acknowledge his awareness that he is continuing to make improvements.  While he did attempt to rationalize and justify discharge as soon as possible and described the allegiance with this goal with his wife and his son (not confirmed by wife or son) he was much more appropriate today and more redirectable.  There is improvement in attention and ability to shift focus, although he continues to have information processing issues and significant attentional deficits around shifting attention effectively.

## 2020-05-26 NOTE — Progress Notes (Signed)
[place] PHYSICAL MEDICINE & REHABILITATION PROGRESS NOTE   Subjective/Complaints: Pt states he had his best night of sleep yet. Asked what his dc date was and upset that he can't go home sooner. Reports headaches along the right occipital area. Said he had headaches before but these are more intense. Doesn't want to use oxycodone but tylenol doesn't help. Told me he was trying to be safer and to use better judgement  ROS: Patient denies fever, rash, sore throat, blurred vision, nausea, vomiting, diarrhea, cough, shortness of breath or chest pain, joint or back pain,  or mood change.    Objective:   No results found. No results for input(s): WBC, HGB, HCT, PLT in the last 72 hours. No results for input(s): NA, K, CL, CO2, GLUCOSE, BUN, CREATININE, CALCIUM in the last 72 hours.  Intake/Output Summary (Last 24 hours) at 05/26/2020 0956 Last data filed at 05/26/2020 0835 Gross per 24 hour  Intake 340 ml  Output --  Net 340 ml        Physical Exam: Vital Signs Blood pressure (!) 124/92, pulse 81, temperature 97.7 F (36.5 C), temperature source Oral, resp. rate 18, height 5' 9.02" (1.753 m), weight 95.6 kg, SpO2 100 %. Constitutional: No distress . Vital signs reviewed. HEENT: EOMI, oral membranes moist Neck: supple Cardiovascular: RRR without murmur. No JVD    Respiratory/Chest: CTA Bilaterally without wheezes or rales. Normal effort    GI/Abdomen: BS +, non-tender, non-distended Ext: no clubbing, cyanosis, or edema Psych: pleasant, more focused, cooperative Musc: No edema in extremities.  No tenderness in extremities. Neurologic oriented to person, place, date. Improving awareness. Motor: 4+ to 5/5.excellent sitting balance.  .    Assessment/Plan: 1. Functional deficits which require 3+ hours per day of interdisciplinary therapy in a comprehensive inpatient rehab setting.  Physiatrist is providing close team supervision and 24 hour management of active medical problems  listed below.  Physiatrist and rehab team continue to assess barriers to discharge/monitor patient progress toward functional and medical goals  Care Tool:  Bathing    Body parts bathed by patient: Right arm,Left arm,Chest,Abdomen,Right upper leg,Left upper leg,Face   Body parts bathed by helper: Front perineal area,Buttocks,Right lower leg,Left lower leg     Bathing assist Assist Level: Moderate Assistance - Patient 50 - 74%     Upper Body Dressing/Undressing Upper body dressing   What is the patient wearing?: Hospital gown only,Pull over shirt    Upper body assist Assist Level: Minimal Assistance - Patient > 75%    Lower Body Dressing/Undressing Lower body dressing      What is the patient wearing?: Incontinence brief     Lower body assist Assist for lower body dressing: Minimal Assistance - Patient > 75%     Toileting Toileting    Toileting assist Assist for toileting: Independent Assistive Device Comment: Urinal   Transfers Chair/bed transfer  Transfers assist     Chair/bed transfer assist level: Independent     Locomotion Ambulation   Ambulation assist      Assist level: Supervision/Verbal cueing Assistive device: No Device Max distance: 261ft   Walk 10 feet activity   Assist     Assist level: Supervision/Verbal cueing Assistive device: No Device   Walk 50 feet activity   Assist    Assist level: Supervision/Verbal cueing Assistive device: No Device    Walk 150 feet activity   Assist    Assist level: Supervision/Verbal cueing Assistive device: No Device    Walk 10 feet on  uneven surface  activity   Assist Walk 10 feet on uneven surfaces activity did not occur: Safety/medical concerns   Assist level: Supervision/Verbal cueing Assistive device: Other (comment) (intermittent use of rail)   Wheelchair     Assist Will patient use wheelchair at discharge?: No             Wheelchair 50 feet with 2 turns  activity    Assist            Wheelchair 150 feet activity     Assist          Blood pressure (!) 124/92, pulse 81, temperature 97.7 F (36.5 C), temperature source Oral, resp. rate 18, height 5' 9.02" (1.753 m), weight 95.6 kg, SpO2 100 %.    Medical Problem List and Plan: 1.TBI/SDH/nondisplaced right occipital bone fracturesecondary tounwitnessed fall 05/04/2020  Continue CIR PT, OT, SLP   -has made nice progress behaviorally and cognitively this week  -ELOS 06/02/20, family conference today  -spent time describing concerns over safety, impulsivity, and awareness and that we would be focusing on these while here. I did acknowledge that he is improving. We discuss the fact that he won't be driving when he returns home initially 2. Antithrombotics: -DVT/anticoagulation:Subcutaneous heparin 05/08/2020 -antiplatelet therapy: N/A 3. Pain Management:pt agreed to try tramadol for headaches   -dc oxycodone   -tylenol prn 4. Mood/sleep:Melatonin 5 mg nightly as needed. -continue sleep chart -antipsychotic agents: seroquel  2/9 continue seroquel 100mg  qhs with 25mg  scheduled in morning   -seroquel 50mg  prn for moderate agitation   -propranolol XR 120mg  daily   -IM Ativan for severe agitation   -maintain behavioral plan    -mood/sleep both improving 5. Neuropsych: This patientis notcapable of making decisions on hisown behalf. 6. Skin/Wound Care:Routine skin checks 7. Fluids/Electrolytes/Nutrition: -labs ok 2/7 8. Seizure prophylaxis. Keppra    Decreased to 750mg  bid on 2/3, reduce to 500mg  bid with PM dose 2/7  2/11 reduce to 250mg  bid 9. Atrial fibrillation with RVR. Intravenous Cardizem discontinued. dc Coreg 25 mg as below, start propranolol for behavioral benefits Controlled on 2/11 10. Hypertension.   Vitals:   05/25/20 2006 05/26/20 0442  BP: 115/89 (!) 124/92  Pulse: 72 81  Resp:  19 18  Temp: 97.9 F (36.6 C) 97.7 F (36.5 C)  SpO2: 98% 100%    Continue propranolol in place of coreg--change to XR form  continue norvasc and lisinopril  Good control 2/11 11. Decreased nutritional storage/Dysphagia:  Diet has been advanced to regular.  12. History of alcohol use. Monitor for any signs of withdrawal 13. Constipation. MiraLAX as directed. Adjust bowel program as needed.  Bowel meds increased on 2/4  Had bm 2/8 14. History recent Covid March and then again December 2021. Rapid antigen test was negative. It was felt that Covid positive PCR test indicated dead virus shedding rather than active infection and isolation discontinued. 15.  Hyponatremia  Sodium 133 on 2/7   Continue to monitor  LOS: 14 days A FACE TO FACE EVALUATION WAS PERFORMED  07/23/20 05/26/2020, 9:56 AM

## 2020-05-26 NOTE — Progress Notes (Signed)
Physical Therapy Session Note  Patient Details  Name: Timothy Case MRN: [id number] Date of Birth: 11-Jun-1955  Today's Date: 05/26/2020 PT Individual Time:Session1: 9563-8756; Session 2: 4332-9518 PT Individual Time Calculation (min): 55 min & 38 min  Short Term Goals: Week 2:  PT Short Term Goal 1 (Week 2): Patient will participate in standardized balance assessment with min cues for attention and behavior managment. PT Short Term Goal 2 (Week 2): Pt will ascend/descend 4 steps using B HRs with CGA. PT Short Term Goal 3 (Week 2): Patient will participate in >75% of his PT sessions for the full scheduled time. PT Short Term Goal 4 (Week 2): Patient will ambulate >150 ft with supervision for safety.  Skilled Therapeutic Interventions/Progress Updates:    Session1:  Patient in supine reports tired of everything about this place "food, noises, bed, etc" and frustrated he was told has to stay longer.  Patient redirected and participated in therex consisting of Nu Step x 8 minutes UE/LE.  Stretching on mat to include child's pose 2 x 20 sec, prone on elbows x 20 sec and doorway stretch for chest x 10 sec.  Prone on mat also for hip extension x 10 each leg.  Independent with rolling and attaining positions throughout.  Patient ambulating throughout session in hallway between gyms with S for safety, one episode catching toe on L and self recovery with S.    Dynamic gait/balance activities: forward tandem, backwards walking, starts and stops, and walking with head turns to ID playing cards.  Balance with L vs R on 6" step with head turns to ID cards as well.  Negotiated 12 steps with rails and S.  Ambulated back to room with tennis racket balancing ball on racket.  Patient left in room with wife present. She brought him sandwich.  He had complained throughout about medication making him feel bad and not eating the food here. Whispered to him plan for next session to find Valentine's card in  dayroom for him to sign to give to his wife on Monday.  Session2:  Patient in room with wife present.  Patient seated EOB upon entry.  Reports feels a little better after eating.  Patient requesting his wife bring his computer and phone while he is here to limit boredom, etc.  Patient ambulated to dayroom and remembered plan stated at the end of first session to find a card for his wife.  Chose card from available options quickly stating he doesn't really celebrate holidays much and not even his own birthday.  Patient seated at table to sign and address card for his wife.   Standing for [employer] x 10 steps prior to pt discontinuing due to c/o knee pain and remembered he wants to get his wife to bring his supplement that helps his knees.  Ambulated to therapy gym and patient worked on balance on wobble board in parallel bars x 1 minute anterior/posterior orientation, then 1 minute R/L orientation.  Ambulated to dayroom and initiated tennis on Wii which pt briefly attempted in standing, but quickly c/o making him dizzy and he requested trying with actual racket.  Ambulated to gym and attempted tennis with rackets and soft ball, but difficulty getting consistently to target, so attempted hitting against the wall, then bouncing ball on racket.  All terminated quickly due to either making him dizzy or too hard with set up in the room.  Patient performed 2 x about 30 seconds "boxing" on punching bag.  Patient returned to  room and left in supine with needs in reach.  Therapy Documentation Precautions:  Precautions Precautions: Fall,Other (comment) Precaution Comments: impaired safety awareness Restrictions Weight Bearing Restrictions: No General:   Vital Signs: Therapy Vitals Temp: 97.7 F (36.5 C) Temp Source: Oral Pulse Rate: 82 Resp: 18 BP: 109/65 Patient Position (if appropriate): Sitting Oxygen Therapy SpO2: 99 % O2 Device: Room Air Pain: Pain Assessment Pain Scale: 0-10 Pain Score: 6   Faces Pain Scale: Hurts a little bit Pain Type: Acute pain Pain Location: Generalized Pain Descriptors / Indicators: Discomfort;Aching Pain Onset: On-going Pain Intervention(s): Ambulation/increased activity;Distraction   Therapy/Group: Individual Therapy  Elray Mcgregor  Sheran Lawless, PT 05/26/2020, 4:10 PM

## 2020-05-26 NOTE — Progress Notes (Signed)
MD Riley Kill notifed that pt wife requests to speak with him.  Mylo Red, LPN

## 2020-05-27 NOTE — Progress Notes (Signed)
Speech Language Pathology Daily Session Note  Patient Details  Name: Timothy Case MRN: [id number] Date of Birth: 05-01-1955  Today's Date: 05/27/2020 SLP Individual Time: 1300-1400 SLP Individual Time Calculation (min): 60 min  Short Term Goals: Week 1: SLP Short Term Goal 1 (Week 1): Pt will utilize external aids to reorient to place, date, and situation with mod assist multimodal cues. SLP Short Term Goal 1 - Progress (Week 1): Not met SLP Short Term Goal 2 (Week 1): Pt will complete basic, familiar tasks wtih mod assist multimodal cues. SLP Short Term Goal 2 - Progress (Week 1): Not met SLP Short Term Goal 3 (Week 1): Pt will return demonstration of at least 2 safety precautions during functional tasks with mod assist multimodal cues. SLP Short Term Goal 3 - Progress (Week 1): Not met SLP Short Term Goal 4 (Week 1): Pt will sustain his attention to basic, familiar tasks for ~2 minute intervals with mod cues for redirection. SLP Short Term Goal 4 - Progress (Week 1): Not met SLP Short Term Goal 5 (Week 1): Pt will consume regular textures and thin liquids with min cues for use of universal precautions and minimal overt s/s of aspiration. SLP Short Term Goal 5 - Progress (Week 1): Met  Skilled Therapeutic Interventions:   Patient seen for skilled ST session focusing on cognitive function goals with some education with spouse at end of session. Patient initially required moderate cues to read simulated pill label to determine dosage of a medication during pillbox task, however he was then able to read, interpret and place simulated medication in pill box with only supervision assistance and with 100% accuracy. SLP discussed with patient his upcoming discharge with focus on him not being able to drive or participate in his normal routine (includes 5 mile run, workout with trainer, etc). Patient acknowledged that changes will be made but he did not demonstrate adequate understand  or appreciate his increased risk for further brain trauma should he fall again. During discussion with spouse, she stated her main questions and concerns were: could she walk with him outside their home, what to do if he needed to get up to use bathroom at night (concern regarding marble floor in bathroom but that she has placed carpet, SLP recommending it be adequately secured to floor). She also reiterated (likely from MD) importance of patient refraining from consuming alcohol. Spouse also inquired about medications that would continue post discharge (seroquel, etc). Patient continues to benefit from skilled SLP intervention to maximize cognitive functioning prior to discharge.  Pain Pain Assessment Pain Scale: 0-10 Pain Score: 0-No pain Pain Type: Acute pain Pain Location: Head Pain Descriptors / Indicators: Aching Pain Onset: On-going Pain Intervention(s): Medication (See eMAR)  Therapy/Group: Individual Therapy  Sonia Baller, MA, CCC-SLP Speech Therapy

## 2020-05-27 NOTE — Plan of Care (Signed)
  Problem: Consults Goal: RH BRAIN INJURY PATIENT EDUCATION Description: Description: See Patient Education module for eduction specifics Outcome: Progressing Goal: Skin Care Protocol Initiated - if Braden Score 18 or less Description: If consults are not indicated, leave blank or document N/A Outcome: Progressing Goal: Nutrition Consult-if indicated Outcome: Progressing   Problem: RH BOWEL ELIMINATION Goal: RH STG MANAGE BOWEL WITH ASSISTANCE Description: STG Manage Bowel with mod I Assistance. Outcome: Progressing Goal: RH STG MANAGE BOWEL W/MEDICATION W/ASSISTANCE Description: STG Manage Bowel with Medication with mod I Assistance. Outcome: Progressing   Problem: RH BLADDER ELIMINATION Goal: RH STG MANAGE BLADDER WITH ASSISTANCE Description: STG Manage Bladder With mod I Assistance Outcome: Progressing   Problem: RH SKIN INTEGRITY Goal: RH STG SKIN FREE OF INFECTION/BREAKDOWN Outcome: Progressing Goal: RH STG MAINTAIN SKIN INTEGRITY WITH ASSISTANCE Description: STG Maintain Skin Integrity With mod I Assistance. Outcome: Progressing   Problem: RH SAFETY Goal: RH STG ADHERE TO SAFETY PRECAUTIONS W/ASSISTANCE/DEVICE Description: STG Adhere to Safety Precautions With min Assistance/Device. Outcome: Progressing   Problem: RH COGNITION-NURSING Goal: RH STG USES MEMORY AIDS/STRATEGIES W/ASSIST TO PROBLEM SOLVE Description: STG Uses Memory Aids/Strategies With min Assistance to Problem Solve. Outcome: Progressing Goal: RH STG ANTICIPATES NEEDS/CALLS FOR ASSIST W/ASSIST/CUES Description: STG Anticipates Needs/Calls for Assist With min Assistance/Cues. Outcome: Progressing   Problem: RH PAIN MANAGEMENT Goal: RH STG PAIN MANAGED AT OR BELOW PT'S PAIN GOAL Description: Pain scale <4/10 Outcome: Progressing   Problem: RH KNOWLEDGE DEFICIT BRAIN INJURY Goal: RH STG INCREASE KNOWLEDGE OF SELF CARE AFTER BRAIN INJURY Outcome: Progressing   Problem: Safety: Goal: Non-violent  Restraint(s) Outcome: Progressing   

## 2020-05-27 NOTE — Progress Notes (Signed)
[facility] PHYSICAL MEDICINE & REHABILITATION PROGRESS NOTE   Subjective/Complaints: Had a good night sleep. Would have been even better in his own bed though! No problems overnight  ROS: Patient denies fever, rash, sore throat, blurred vision, nausea, vomiting, diarrhea, cough, shortness of breath or chest pain, joint or back pain,  or mood change.   Objective:   No results found. No results for input(s): WBC, HGB, HCT, PLT in the last 72 hours. No results for input(s): NA, K, CL, CO2, GLUCOSE, BUN, CREATININE, CALCIUM in the last 72 hours.  Intake/Output Summary (Last 24 hours) at 05/27/2020 1056 Last data filed at 05/27/2020 0726 Gross per 24 hour  Intake 120 ml  Output --  Net 120 ml        Physical Exam: Vital Signs Blood pressure 114/76, pulse 78, temperature 98 F (36.7 C), resp. rate 18, height 5' 9.02" (1.753 m), weight 95.5 kg, SpO2 98 %. General: No acute distress HEENT: EOMI, oral membranes moist Cards: reg rate  Chest: normal effort Abdomen: Soft, NT, ND Skin: dry, intact Extremities: no edema Psych: pleasant and appropriate Musc: No edema in extremities.  No tenderness in extremities. Neurologic oriented to person, place, date. Improving awareness. Motor: 4+ to 5/5.excellent sitting balance.  .    Assessment/Plan: 1. Functional deficits which require 3+ hours per day of interdisciplinary therapy in a comprehensive inpatient rehab setting.  Physiatrist is providing close team supervision and 24 hour management of active medical problems listed below.  Physiatrist and rehab team continue to assess barriers to discharge/monitor patient progress toward functional and medical goals  Care Tool:  Bathing    Body parts bathed by patient: Right arm,Left arm,Chest,Abdomen,Right upper leg,Left upper leg,Face   Body parts bathed by helper: Front perineal area,Buttocks,Right lower leg,Left lower leg     Bathing assist Assist Level: Moderate Assistance - Patient  50 - 74%     Upper Body Dressing/Undressing Upper body dressing   What is the patient wearing?: Hospital gown only,Pull over shirt    Upper body assist Assist Level: Minimal Assistance - Patient > 75%    Lower Body Dressing/Undressing Lower body dressing      What is the patient wearing?: Incontinence brief     Lower body assist Assist for lower body dressing: Minimal Assistance - Patient > 75%     Toileting Toileting    Toileting assist Assist for toileting: Independent Assistive Device Comment: Urinal   Transfers Chair/bed transfer  Transfers assist     Chair/bed transfer assist level: Independent     Locomotion Ambulation   Ambulation assist      Assist level: Supervision/Verbal cueing Assistive device: No Device Max distance: 300'   Walk 10 feet activity   Assist     Assist level: Supervision/Verbal cueing Assistive device: No Device   Walk 50 feet activity   Assist    Assist level: Supervision/Verbal cueing Assistive device: No Device    Walk 150 feet activity   Assist    Assist level: Supervision/Verbal cueing Assistive device: No Device    Walk 10 feet on uneven surface  activity   Assist Walk 10 feet on uneven surfaces activity did not occur: Safety/medical concerns   Assist level: Supervision/Verbal cueing Assistive device: Other (comment) (intermittent use of rail)   Wheelchair     Assist Will patient use wheelchair at discharge?: No             Wheelchair 50 feet with 2 turns activity    Assist  Wheelchair 150 feet activity     Assist          Blood pressure 114/76, pulse 78, temperature 98 F (36.7 C), resp. rate 18, height 5' 9.02" (1.753 m), weight 95.5 kg, SpO2 98 %.    Medical Problem List and Plan: 1.TBI/SDH/nondisplaced right occipital bone fracturesecondary tounwitnessed fall 05/04/2020  Continue CIR PT, OT, SLP   -has made nice progress behaviorally and  cognitively this week  -ELOS 06/02/20, family conference today  -may be able to move his dc date up to 2/16 as he's made tremendous progress 2. Antithrombotics: -DVT/anticoagulation:Subcutaneous heparin 05/08/2020 -antiplatelet therapy: N/A 3. Pain Management:pt agreed to try tramadol for headaches   -dc oxycodone   -tylenol prn 4. Mood/sleep:Melatonin 5 mg nightly as needed. -continue sleep chart -antipsychotic agents: seroquel  2/12 continue seroquel 100mg  qhs with 25mg  scheduled in morning   -seroquel 50mg  prn for moderate agitation   -propranolol XR 120mg  daily   -IM Ativan for severe agitation   -improved. Continue same meds 5. Neuropsych: This patientis notcapable of making decisions on hisown behalf. 6. Skin/Wound Care:Routine skin checks 7. Fluids/Electrolytes/Nutrition: -labs ok 2/7 8. Seizure prophylaxis. Keppra    Decreased to 750mg  bid on 2/3, reduce to 500mg  bid with PM dose 2/7  2/12 reduced to 250mg  bid yesterday. Wean off prior to discharge 9. Atrial fibrillation with RVR. Intravenous Cardizem discontinued. dc Coreg 25 mg as below, start propranolol for behavioral benefits Controlled on 2/11 10. Hypertension.   Vitals:   05/26/20 1953 05/27/20 0406  BP: 113/89 114/76  Pulse: 77 78  Resp: 18 18  Temp: 98.1 F (36.7 C) 98 F (36.7 C)  SpO2: 100% 98%    Continue propranolol in place of coreg--change to XR form  continue norvasc and lisinopril  Good control 2/12 11. Decreased nutritional storage/Dysphagia:  Diet has been advanced to regular.  12. History of alcohol use. Monitor for any signs of withdrawal 13. Constipation. MiraLAX as directed. Adjust bowel program as needed.  Moving bowels 14. History recent Covid March and then again December 2021. Rapid antigen test was negative. It was felt that Covid positive PCR test indicated dead virus shedding rather than active  infection and isolation discontinued. 15.  Hyponatremia  Sodium 133 on 2/7   Continue to monitor  LOS: 15 days A FACE TO FACE EVALUATION WAS PERFORMED  4/11 05/27/2020, 10:56 AM

## 2020-05-28 NOTE — Plan of Care (Signed)
  Problem: Consults Goal: RH BRAIN INJURY PATIENT EDUCATION Description: Description: See Patient Education module for eduction specifics Outcome: Progressing Goal: Skin Care Protocol Initiated - if Braden Score 18 or less Description: If consults are not indicated, leave blank or document N/A Outcome: Progressing Goal: Nutrition Consult-if indicated Outcome: Progressing   Problem: RH BOWEL ELIMINATION Goal: RH STG MANAGE BOWEL WITH ASSISTANCE Description: STG Manage Bowel with mod I Assistance. Outcome: Progressing Goal: RH STG MANAGE BOWEL W/MEDICATION W/ASSISTANCE Description: STG Manage Bowel with Medication with mod I Assistance. Outcome: Progressing   Problem: RH BLADDER ELIMINATION Goal: RH STG MANAGE BLADDER WITH ASSISTANCE Description: STG Manage Bladder With mod I Assistance Outcome: Progressing   Problem: RH SKIN INTEGRITY Goal: RH STG SKIN FREE OF INFECTION/BREAKDOWN Outcome: Progressing Goal: RH STG MAINTAIN SKIN INTEGRITY WITH ASSISTANCE Description: STG Maintain Skin Integrity With mod I Assistance. Outcome: Progressing   Problem: RH SAFETY Goal: RH STG ADHERE TO SAFETY PRECAUTIONS W/ASSISTANCE/DEVICE Description: STG Adhere to Safety Precautions With min Assistance/Device. Outcome: Progressing   Problem: RH COGNITION-NURSING Goal: RH STG USES MEMORY AIDS/STRATEGIES W/ASSIST TO PROBLEM SOLVE Description: STG Uses Memory Aids/Strategies With min Assistance to Problem Solve. Outcome: Progressing Goal: RH STG ANTICIPATES NEEDS/CALLS FOR ASSIST W/ASSIST/CUES Description: STG Anticipates Needs/Calls for Assist With min Assistance/Cues. Outcome: Progressing   Problem: RH PAIN MANAGEMENT Goal: RH STG PAIN MANAGED AT OR BELOW PT'S PAIN GOAL Description: Pain scale <4/10 Outcome: Progressing   Problem: RH KNOWLEDGE DEFICIT BRAIN INJURY Goal: RH STG INCREASE KNOWLEDGE OF SELF CARE AFTER BRAIN INJURY Outcome: Progressing

## 2020-05-28 NOTE — Progress Notes (Signed)
Occupational Therapy Session Note  Patient Details  Name: Timothy Case MRN: [id number] Date of Birth: Nov 17, 1955  Today's Date: 05/28/2020 OT Missed Time: 30 Minutes Missed Time Reason: Patient unwilling/refused to participate without medical reason    Pt refused initial session d/t wanting to sleep in. OT came by a second time and pt was on the phone with his eldest son and declined session again. 30 min missed.   Therapy Documentation Precautions:  Precautions Precautions: Fall,Other (comment) Precaution Comments: impaired safety awareness Restrictions Weight Bearing Restrictions: No  Therapy/Group: Individual Therapy  Crissie Reese 05/28/2020, 6:36 AM

## 2020-05-28 NOTE — Progress Notes (Signed)
Physical Therapy Weekly Progress Note  Patient Details  Name: Timothy Case MRN: [id number] Date of Birth: Jan 18, 1956  Beginning of progress report period: May 21, 2020 End of progress report period: May 28, 2020  Today's Date: 05/28/2020 PT Individual Time: 0950-1030 PT Individual Time Calculation (min): 40 min   Patient has met 4 of 4 short term goals.  Patient with good progress this week with improved participation and reduced agitation during therapy sessions. Patient currently is independent in the room due to improved balance and independence with functional mobility and for improved restlessness and agitation. He continues to require supervision for gait longer distances, >100 ft, and in busier environments due to increased distractibility, and for 12 steps with B rails for safety. Continues to present with poor frustration tolerance, verbose language, decreased attention, decreased incite into his deficits, decreased safety awareness, and decreased balance with higher level standing balance, dynamic gait, and dual task activities.   Patient continues to demonstrate the following deficits decreased cardiorespiratoy endurance, decreased attention, decreased awareness, decreased problem solving, decreased safety awareness and decreased memory and decreased sitting balance, decreased standing balance, decreased postural control and decreased balance strategies and therefore will continue to benefit from skilled PT intervention to increase functional independence with mobility.  Patient progressing toward long term goals..  Plan of care revisions: upgraded goals due to patient's progress, continue to recommend 24/7 supervision at d/c for safety awareness and poor incite.  PT Short Term Goals Week 2:  PT Short Term Goal 1 (Week 2): Patient will participate in standardized balance assessment with min cues for attention and behavior managment. PT Short Term Goal 1 -  Progress (Week 2): Met PT Short Term Goal 2 (Week 2): Pt will ascend/descend 4 steps using B HRs with CGA. PT Short Term Goal 2 - Progress (Week 2): Met PT Short Term Goal 3 (Week 2): Patient will participate in >75% of his PT sessions for the full scheduled time. PT Short Term Goal 3 - Progress (Week 2): Met PT Short Term Goal 4 (Week 2): Patient will ambulate >150 ft with supervision for safety. PT Short Term Goal 4 - Progress (Week 2): Met Week 3:  PT Short Term Goal 1 (Week 3): STG=LTG due to ELOS.  Skilled Therapeutic Interventions/Progress Updates:     Patient in recliner on the phone upon PT arrival. Patient appropriately asked for a few minutes to speak with his oldest son. Patient opened the door 10 min later to appropriately inform PT that he was ready for therapy. Patient alert and agreeable to PT session. Patient reported 4-5/10 low back pain during session, RN made aware. PT provided repositioning, rest breaks, and distraction as pain interventions throughout session.   Patient remained appropriate without agitation throughout session. Noted improvements in frustration tolerance and confabulation with communication today.   Patient appropriately expressed that "arbitrary exercises" had little meaning to him and that he would be more compliant with exercises related to activities he enjoys. Focused session on education on home exercise progressing and management of TBI symptoms with activities. Educated on initiating mobility and exercises at home in a controlled environment before returning to the gym, putting range, boat, etc, to progress with OPPT prior to returning to his personal trainer, suggested communication and collaboration with patient's personal training and OP close to d/c from OPPT to transition to PLOF with exercise routine. Patient performed 2 full golf swings and 6 half golf swings (modified due to increased back pain with full swing) with  a putter with supervision for  safety. Educated on performing practice swings at home initially then progressing to putting range, then driving range or golf course.   Performed the following exercises to reduce low back pain seated hands to floor 2x30 sec, seated twist with forward flexion x2 (limited by increased pain), and standing lumbar extension 2x10 with symptom improvement.   Spent increased time educating on TBI symptoms and tolerance and strategies for management at d/c. Patient receptive to all education. Continues to credit symptoms to things other than his TBI with poor incite, however, allowed therapist to continue with education today. Patient ambulated to/from the day room with supervision for safety, continues to have increased BOS and slow gait speed, but improved since last week.  Patient in recliner in the room at end of session with breaks locked and all needs within reach.    Therapy Documentation Precautions:  Precautions Precautions: Fall,Other (comment) Precaution Comments: impaired safety awareness Restrictions Weight Bearing Restrictions: No   Therapy/Group: Individual Therapy  Nivea Wojdyla L Marabella Popiel PT, DPT  05/28/2020, 12:46 PM

## 2020-05-28 NOTE — Plan of Care (Signed)
Problem: RH Balance Goal: LTG Patient will maintain dynamic sitting balance (PT) Description: LTG:  Patient will maintain dynamic sitting balance with assistance during mobility activities (PT) Flowsheets (Taken 05/28/2020 0622) LTG: Pt will maintain dynamic sitting balance during mobility activities with:: (upgraded goal due to patient's progress with functional mobility) Independent Note: upgraded goal due to patient's progress with functional mobility Goal: LTG Patient will maintain dynamic standing balance (PT) Description: LTG:  Patient will maintain dynamic standing balance with assistance during mobility activities (PT) Flowsheets (Taken 05/28/2020 0622) LTG: Pt will maintain dynamic standing balance during mobility activities with:: (upgraded goal due to patient's progress with functional mobility) Independent Note: upgraded goal due to patient's progress with functional mobility   Problem: Sit to Stand Goal: LTG:  Patient will perform sit to stand with assistance level (PT) Description: LTG:  Patient will perform sit to stand with assistance level (PT) Flowsheets (Taken 05/28/2020 0622) LTG: PT will perform sit to stand in preparation for functional mobility with assistance level: (upgraded goal due to patient's progress with functional mobility) Independent Note: upgraded goal due to patient's progress with functional mobility   Problem: RH Bed Mobility Goal: LTG Patient will perform bed mobility with assist (PT) Description: LTG: Patient will perform bed mobility with assistance, with/without cues (PT). Flowsheets (Taken 05/28/2020 0622) LTG: Pt will perform bed mobility with assistance level of: (upgraded goal due to patient's progress with functional mobility) Independent Note: upgraded goal due to patient's progress with functional mobility    Problem: RH Bed to Chair Transfers Goal: LTG Patient will perform bed/chair transfers w/assist (PT) Description: LTG: Patient will  perform bed to chair transfers with assistance (PT). Flowsheets (Taken 05/28/2020 0622) LTG: Pt will perform Bed to Chair Transfers with assistance level: (upgraded goal due to patient's progress with functional mobility) Independent Note: upgraded goal due to patient's progress with functional mobility   Problem: RH Ambulation Goal: LTG Patient will ambulate in controlled environment (PT) Description: LTG: Patient will ambulate in a controlled environment, # of feet with assistance (PT). Flowsheets (Taken 05/28/2020 0622) LTG: Pt will ambulate in controlled environ  assist needed:: (upgraded goal due to patient's progress with functional mobility and activity tolerance) Supervision/Verbal cueing LTG: Ambulation distance in controlled environment: >1250 ft during the 6 Minute Walk Test to demonstrate MCID for improved activity tolerance Note: upgraded goal due to patient's progress with functional mobility and activity tolerance Goal: LTG Patient will ambulate in home environment (PT) Description: LTG: Patient will ambulate in home environment, # of feet with assistance (PT). Flowsheets Taken 05/28/2020 0624 by Serina Cowper L, PT LTG: Pt will ambulate in home environ  assist needed:: (upgraded goal due to patient's progress with functional mobility) Independent Taken 05/13/2020 1925 by Ginny Forth, PT LTG: Ambulation distance in home environment: 37ft using LRAD Note: upgraded goal due to patient's progress with functional mobility   Problem: RH Stairs Goal: LTG Patient will ambulate up and down stairs w/assist (PT) Description: LTG: Patient will ambulate up and down # of stairs with assistance (PT) Flowsheets (Taken 05/13/2020 1925 by Ginny Forth, PT) LTG: Pt will ambulate up/down stairs assist needed:: Supervision/Verbal cueing   Problem: RH Floor Transfers Goal: LTG Patient will perform floor transfers w/assist (PT) Description: LTG: Patient will perform floor transfers with  assistance (PT). Flowsheets (Taken 05/28/2020 4097) LTG: PT WILL PERFORM FLOOR TRANFERS  WITH  ASSIST:: Supervision/Verbal cueing   Problem: RH Attention Goal: LTG Patient will demonstrate this level of attention during functional activites (PT) Description:  LTG:  Patient will demonstrate this level of attention during functional activites (PT) Flowsheets (Taken 05/28/2020 [id number]) Patient will demonstrate this level of attention during functional activites: Selective LTG: Patient will demonstrate attention during functional mobility with assistance of: Supervision   Problem: RH Awareness Goal: LTG: Patient will demonstrate awareness during functional activites type of (PT) Description: LTG: Patient will demonstrate awareness during functional activites type of (PT) Flowsheets (Taken 05/28/2020 3968) Patient will demonstrate awareness during functional activites type of: Intellectual LTG: Patient will demonstrate awareness during functional activites type of (PT): Minimal Assistance - Patient > 75%

## 2020-05-29 NOTE — Progress Notes (Signed)
Physical Therapy Session Note  Patient Details  Name: Timothy Case MRN: [id number] Date of Birth: 06/13/1955  Today's Date: 05/29/2020 PT Individual Time: 1300-1400 PT Individual Time Calculation (min): 60 min   Short Term Goals: Week 3:  PT Short Term Goal 1 (Week 3): STG=LTG due to ELOS.  Skilled Therapeutic Interventions/Progress Updates:     Patient bed upon PT arrival. Patient alert and agreeable to PT session. Patient denied pain during session. Reported that he slept poorly and initially requested that therapist come back later to allow him to rest. PT unable to adjust schedule this afternoon. Patient stated understanding and agreeable to participated in [facility] Day activities in preparation for d/c tomorrow.   Therapeutic Activity: Bed Mobility: Patient performed rolling R/L and supine to/from sit independently in a flat bed without use of bed rails. Transfers: Patient performed sit to/from stand from various household surfaces independently throughout session. Patient performed a simulated SUV height car transfer independently using car frame for stabilization and step-in technique without LOB. Patient self-initiated sitting on the passenger side and stated that he would not be driving any time soon without prompting from PT. Provided positive reinforcement for this, as patient is not safe to drive at this time due to cognitive and functional deficits. Patient performed a floor transfer with supervision for safety. Educated on having someone with him if transferring to the floor for exercise and to perform self-assessment prior to getting up with assist from someone. Instructed patient or his wife to activate emergency response in the event of severe injury, or head trauma resulting from a fall. Patient and his wife in agreement.   Gait Training:  Patient ambulated around the unit independently throughout session. Improved gait speed and BOS, provided cues for increased  step length and arm swing x1 for improved gait speed and balance with gait.  Patient ambulated up/down a ramp, over 10 feet of mulch (unlevel surface), and up/down a curb to simulate community ambulation over unlevel surfaces with supervision for safety without an AD.  Patient ascended/descended 12-6" steps steps using 1 rail with supervision. Performed reciprocal gait pattern.   6 Min Walk Test:  Instructed patient to ambulate as quickly and as safely as possible for 6 minutes using LRAD. Patient was allowed to take standing rest breaks without stopping the test, but if he required a sitting rest break the clock would be stopped and the test would be over.  Results: 1373 feet or 418.5 meters ambulating independently, RPE 5/10 after, HR 80 bpm  Results indicate decreased activity tolerance compared to age matched norms. (Age matched norm for male 47-69 yo = 572 meters)   Educated patient and his wife on d/c planning, home safety, community integration, discussing follow-up visits with providers, return to exercise, home exercise program, walking program, and support group information during session. Patient and his wife very appreciative and receptive to all education.   Patient in recliner with his wife in the room at end of session with breaks locked and all needs within reach.    Therapy Documentation Precautions:  Precautions Precautions: Other (comment) Precaution Comments: RLAS VIII Restrictions Weight Bearing Restrictions: No   Therapy/Group: Individual Therapy  Naylani Bradner L Bobak Oguinn PT, DPT  05/29/2020, 4:22 PM

## 2020-05-29 NOTE — Progress Notes (Signed)
Occupational Therapy Session Note  Patient Details  Name: Tosh Glaze MRN: [id number] Date of Birth: 06-13-1955  Today's Date: 05/29/2020 OT Individual Time: 1212-1237 OT Individual Time Calculation (min): 25 min  35 minutes missed  Short Term Goals: Week 3:  OT Short Term Goal 1 (Week 3): STG=LTG d/t ELOS  Skilled Therapeutic Interventions/Progress Updates:    Pt greeted while on the phone, asking for time to finish his conversation. Afterwards pt opened his door and was agreeable to walk during session. Worked on dynamic standing balance while ambulating without AD around the hospital unit, going down 4 flights of stairs to the atrium with supervision-CGA, and then ambulating throughout the food court and lobby of the [facility] Unit. Pt opted to take the elevators up to the unit again, needing mod cues for pathfinding his way back to the room. He reported that he used to play the guitar so OT provided him with a guitar where he practiced tuning/playing which appeared to brighten affect. Pt expressed feeling grateful for the interprofessional team and ready for d/c home tomorrow. Time missed due to pt refusal/just wanting to wait for his wife to bring him lunch.   Therapy Documentation Precautions:  Precautions Precautions: Other (comment) Precaution Comments: RLAS VIII Restrictions Weight Bearing Restrictions: No Vital Signs:  Pain: no c/o pain during tx Pain Assessment Pain Scale: 0-10 Pain Score: 0-No pain ADL: ADL Eating: Independent Where Assessed-Eating: Chair Grooming: Independent Where Assessed-Grooming: Standing at sink Upper Body Bathing: Minimal assistance,Independent Where Assessed-Upper Body Bathing: Standing at sink Lower Body Bathing: Independent Where Assessed-Lower Body Bathing: Standing at sink Upper Body Dressing: Independent Where Assessed-Upper Body Dressing: Standing at sink Lower Body Dressing: Independent Where Assessed-Lower Body  Dressing: Standing at sink Toileting: Independent Where Assessed-Toileting: Teacher, adult education: Community education officer Method: Surveyor, minerals: Chiropractor Transfer: Designer, industrial/product Method: Optometrist Transfer: Psychologist, counselling Method: Stand pivot     Therapy/Group: Individual Therapy  Homer Pfeifer A Maclovia Uher 05/29/2020, 12:03 PM

## 2020-05-29 NOTE — Discharge Summary (Signed)
Physician Discharge Summary  Patient ID: Timothy Case [facility] MRN: [id number] DOB/AGE: 10/23/1955 65 y.o.  Admit date: 05/12/2020 Discharge date: 05/30/2020  Discharge Diagnoses:  Principal Problem:   TBI (traumatic brain injury) (HCC) Active Problems:   Hyponatremia   Slow transit constipation   Essential hypertension   PAF (paroxysmal atrial fibrillation) (HCC) DVT prophylaxis Seizure prophylaxis History of CVA History of Covid  Discharged Condition: Stable  Significant Diagnostic Studies: CT Angio Head W or Wo Contrast  Result Date: 05/04/2020 CLINICAL DATA:  Patient found down.  Intracranial hemorrhage. EXAM: CT ANGIOGRAPHY HEAD TECHNIQUE: Multidetector CT imaging of the head was performed using the standard protocol during bolus administration of intravenous contrast. Multiplanar CT image reconstructions and MIPs were obtained to evaluate the vascular anatomy. CONTRAST:  OMNIPAQUE IOHEXOL 350 MG/ML SOLN COMPARISON:  CT head 05/04/2020 FINDINGS: CTA HEAD Anterior circulation: Internal carotid artery widely patent through the skull base. Hypoplastic right A1 segment. Anterior and middle cerebral arteries widely patent without stenosis. Negative for vascular malformation. Posterior circulation: Both vertebral arteries patent to the basilar. Left PICA patent. Right PICA not visualized. Right AICA patent. Basilar widely patent. Superior cerebellar and posterior cerebral arteries patent without stenosis. Fetal origin right posterior cerebral artery. Negative for vascular malformation. Venous sinuses: Limited venous enhancement due to arterial phase scanning. Anatomic variants: None IMPRESSION: Negative CTA head.  No vascular malformation Intracranial hemorrhage described on prior CT head report. Electronically Signed   By: Marlan Palau M.D.   On: 05/04/2020 19:41   CT HEAD WO CONTRAST  Result Date: 05/05/2020 CLINICAL DATA:  Follow-up subdural hematoma EXAM: CT HEAD WITHOUT  CONTRAST TECHNIQUE: Contiguous axial images were obtained from the base of the skull through the vertex without intravenous contrast. COMPARISON:  Yesterday FINDINGS: Brain: Expected blooming of hemorrhagic contusions in the bilateral inferior frontal lobes and right superficial cerebellum. Mild increase versus redistribution of subarachnoid hemorrhage scattered along bilateral convexities. Small subdural hematoma on both sides, measuring up to 7 mm in the right parietal region and 3-4 mm along the posterior left falx. Small volume intraventricular hemorrhage at the occipital horn of the left lateral ventricle. No acute infarct.  No hydrocephalus. Vascular: Negative Skull: Nondisplaced right occipital bone fracture reaching the midline superiorly. Sinuses/Orbits: Fluid and patchy sinus opacification in the setting of intubation. IMPRESSION: 1. Expected blooming of hemorrhagic contusions in the bilateral inferior frontal lobes and right cerebellum. 2. Traumatic pattern subarachnoid hemorrhage with either increase or diffusion since prior. 3. Slight increase in the right frontal parietal subdural hematoma which measures up to 7 mm in thickness. Minimal left parafalcine subdural hemorrhage. 4. No hydrocephalus. 5. Nondisplaced right occipital bone fracture. Electronically Signed   By: Marnee Spring M.D.   On: 05/05/2020 05:43   CT HEAD WO CONTRAST  Result Date: 05/04/2020 CLINICAL DATA:  Patient found down.  Fall.  Headache last night. EXAM: CT HEAD WITHOUT CONTRAST CT MAXILLOFACIAL WITHOUT CONTRAST CT CERVICAL SPINE WITHOUT CONTRAST TECHNIQUE: Multidetector CT imaging of the head, cervical spine, and maxillofacial structures were performed using the standard protocol without intravenous contrast. Multiplanar CT image reconstructions of the cervical spine and maxillofacial structures were also generated. COMPARISON:  CT head 04/21/2012. FINDINGS: CT HEAD FINDINGS Brain: Small amount of subarachnoid hemorrhage in  the sylvian fissure bilaterally. Mild diffuse subarachnoid hemorrhage including right occipital lobe. Basilar cisterns without significant subarachnoid hemorrhage. Bifrontal hemorrhagic contusions. Small amount of frontal subarachnoid hemorrhage. Small hemorrhagic contusion in the right cerebellum. Small left frontal subdural hematoma. Right subdural hematoma measuring  approximately 6 mm in thickness over the convexity. Ventricle size normal.  No midline shift.  No acute infarct or mass. Vascular: No hyperdense vessel. Blood in the sylvian fissure obscures the middle cerebral artery bilaterally. Skull: Negative for skull fracture. Other: None CT MAXILLOFACIAL FINDINGS Osseous: Negative for facial fracture Orbits: No fracture of the orbit. Negative for orbital mass or hematoma. Normal globe. There is soft tissue swelling overlying the left eye Sinuses: Mucosal edema throughout the paranasal sinuses without air-fluid level Soft tissues: Moderate soft tissue swelling overlying the left eye compatible with contusion. CT CERVICAL SPINE FINDINGS Alignment: Mild anterolisthesis C4-5 Skull base and vertebrae: Negative for fracture. Motion degraded study. Soft tissues and spinal canal: Negative Disc levels: Multilevel disc and facet degeneration throughout the cervical spine. Multilevel foraminal stenosis. Mild spinal stenosis C5-6 and C6-7. Upper chest: Not image Other: None IMPRESSION: 1. Bilateral small subdural hematomas without midline shift. Diffuse subarachnoid hemorrhage which is predominately in the sylvian fissures with sparing of the basilar cisterns. There are bifrontal hemorrhagic contusions. There is a small left cerebellar hemorrhagic contusion. Pattern most consistent with trauma rather than aneurysm rupture. 2. Negative for facial fracture 3. Negative for cervical spine fracture. Electronically Signed   By: Marlan Palau M.D.   On: 05/04/2020 16:08   CT Cervical Spine Wo Contrast  Result Date:  05/04/2020 CLINICAL DATA:  Patient found down.  Fall.  Headache last night. EXAM: CT HEAD WITHOUT CONTRAST CT MAXILLOFACIAL WITHOUT CONTRAST CT CERVICAL SPINE WITHOUT CONTRAST TECHNIQUE: Multidetector CT imaging of the head, cervical spine, and maxillofacial structures were performed using the standard protocol without intravenous contrast. Multiplanar CT image reconstructions of the cervical spine and maxillofacial structures were also generated. COMPARISON:  CT head 04/21/2012. FINDINGS: CT HEAD FINDINGS Brain: Small amount of subarachnoid hemorrhage in the sylvian fissure bilaterally. Mild diffuse subarachnoid hemorrhage including right occipital lobe. Basilar cisterns without significant subarachnoid hemorrhage. Bifrontal hemorrhagic contusions. Small amount of frontal subarachnoid hemorrhage. Small hemorrhagic contusion in the right cerebellum. Small left frontal subdural hematoma. Right subdural hematoma measuring approximately 6 mm in thickness over the convexity. Ventricle size normal.  No midline shift.  No acute infarct or mass. Vascular: No hyperdense vessel. Blood in the sylvian fissure obscures the middle cerebral artery bilaterally. Skull: Negative for skull fracture. Other: None CT MAXILLOFACIAL FINDINGS Osseous: Negative for facial fracture Orbits: No fracture of the orbit. Negative for orbital mass or hematoma. Normal globe. There is soft tissue swelling overlying the left eye Sinuses: Mucosal edema throughout the paranasal sinuses without air-fluid level Soft tissues: Moderate soft tissue swelling overlying the left eye compatible with contusion. CT CERVICAL SPINE FINDINGS Alignment: Mild anterolisthesis C4-5 Skull base and vertebrae: Negative for fracture. Motion degraded study. Soft tissues and spinal canal: Negative Disc levels: Multilevel disc and facet degeneration throughout the cervical spine. Multilevel foraminal stenosis. Mild spinal stenosis C5-6 and C6-7. Upper chest: Not image Other:  None IMPRESSION: 1. Bilateral small subdural hematomas without midline shift. Diffuse subarachnoid hemorrhage which is predominately in the sylvian fissures with sparing of the basilar cisterns. There are bifrontal hemorrhagic contusions. There is a small left cerebellar hemorrhagic contusion. Pattern most consistent with trauma rather than aneurysm rupture. 2. Negative for facial fracture 3. Negative for cervical spine fracture. Electronically Signed   By: Marlan Palau M.D.   On: 05/04/2020 16:08   US RENAL  Result Date: 05/06/2020 CLINICAL DATA:  Renal failure EXAM: RENAL / URINARY TRACT ULTRASOUND COMPLETE COMPARISON:  03/22/2013 abdominal CT FINDINGS: Right Kidney: Renal  measurements: 10.6 x 6.7 x 6.2 cm = volume: 230 mL. Renal dimensions are favored to be overestimated due to positioning of the kidney. Indeterminate between renal lobulation or isoechoic mass measuring 2.3 cm, arising anteriorly from the upper kidney. No hydronephrosis. Left renal cyst measuring 2.9 cm, also seen in 2014. Left Kidney: Renal measurements: 11.6 x 5.5 x 4.9 cm = volume: 160 mL. No hydronephrosis. Peripelvic cyst at the inferior pole, also seen on prior. Bladder: Catheterized bladder which was not visualized sonographically. IMPRESSION: 1. No hydronephrosis or acute finding. 2. Cortical lobulation versus mass at the upper pole right kidney, measuring approximately 2.3 cm. Given the technical difficulty in this ICU patient, suggest repeat ultrasound in the outpatient setting as the means of initial workup. Electronically Signed   By: Marnee Spring M.D.   On: 05/06/2020 06:01   DG CHEST PORT 1 VIEW  Result Date: 05/07/2020 CLINICAL DATA:  65 year old male with history of respiratory distress. EXAM: PORTABLE CHEST 1 VIEW COMPARISON:  Chest CT 05/04/2020. FINDINGS: Lung volumes are low. Patchy multifocal ill-defined opacities and areas of interstitial prominence throughout the lungs bilaterally, most severe throughout the  mid to lower lungs, favored to reflect multilobar bilateral pneumonia. No pleural effusions. Pulmonary vasculature is largely obscured. Heart size is borderline enlarged. Upper mediastinal contours are within normal limits. IMPRESSION: 1. The appearance of the chest is favored to reflect multilobar bilateral pneumonia, concerning for potential viral pneumonia such as COVID infection. Electronically Signed   By: Trudie Reed M.D.   On: 05/07/2020 18:13   DG Chest Portable 1 View  Result Date: 05/04/2020 CLINICAL DATA:  Intubated EXAM: PORTABLE CHEST 1 VIEW COMPARISON:  04/21/2012 FINDINGS: Endotracheal tube tip about 2.4 cm superior to carina. Esophageal tube tip positioned beneath the medial left diaphragm over the gastric cardia. Mild cardiomegaly. Mild atelectasis left base. No pneumothorax. IMPRESSION: Endotracheal tube tip about 2.4 cm superior to carina. Esophageal tube tip positioned beneath the medial left diaphragm over the gastric cardia. Mild cardiomegaly with subsegmental atelectasis left base. Electronically Signed   By: Jasmine Pang M.D.   On: 05/04/2020 16:55   ECHOCARDIOGRAM COMPLETE  Result Date: 05/10/2020    ECHOCARDIOGRAM REPORT   Patient Name:   Timothy Case [facility] Date of Exam: 05/10/2020 Medical Rec #:  [id number]                    Height:       69.0 in Accession #:    [id number]                   Weight:       226.2 lb Date of Birth:  [birth year]                    BSA:          2.177 m Patient Age:    64 years                     BP:           146/82 mmHg Patient Gender: M                            HR:           54 bpm. Exam Location:  Inpatient Procedure: 2D Echo, Cardiac Doppler and Color Doppler Indications:    Atrial fibrillation  History:  Patient has prior history of Echocardiogram examinations. Risk                 Factors:Hypertension and Dyslipidemia. GERD.  Sonographer:    Ross LudwigArthur Guy RDCS (AE) Referring Phys: [id number] Josephine IgoBRADLEY L ICARD  Sonographer Comments:  Suboptimal subcostal window. IMPRESSIONS  1. Left ventricular ejection fraction, by estimation, is 60 to 65%. The left ventricle has normal function. The left ventricle has no regional wall motion abnormalities. There is moderate left ventricular hypertrophy. Left ventricular diastolic parameters are consistent with Grade II diastolic dysfunction (pseudonormalization). Elevated left ventricular end-diastolic pressure.  2. Right ventricular systolic function is normal. The right ventricular size is normal.  3. The mitral valve is normal in structure. No evidence of mitral valve regurgitation. No evidence of mitral stenosis.  4. The aortic valve is tricuspid. Aortic valve regurgitation is not visualized. Mild to moderate aortic valve sclerosis/calcification is present, without any evidence of aortic stenosis.  5. Aortic dilatation noted. There is mild dilatation of the ascending aorta, measuring 38 mm.  6. The inferior vena cava is normal in size with greater than 50% respiratory variability, suggesting right atrial pressure of 3 mmHg. FINDINGS  Left Ventricle: Left ventricular ejection fraction, by estimation, is 60 to 65%. The left ventricle has normal function. The left ventricle has no regional wall motion abnormalities. The left ventricular internal cavity size was normal in size. There is  moderate left ventricular hypertrophy. Left ventricular diastolic parameters are consistent with Grade II diastolic dysfunction (pseudonormalization). Elevated left ventricular end-diastolic pressure. Right Ventricle: The right ventricular size is normal. No increase in right ventricular wall thickness. Right ventricular systolic function is normal. Left Atrium: Left atrial size was normal in size. Right Atrium: Right atrial size was normal in size. Pericardium: There is no evidence of pericardial effusion. Mitral Valve: The mitral valve is normal in structure. There is mild thickening of the mitral valve leaflet(s). There is  mild calcification of the mitral valve leaflet(s). No evidence of mitral valve regurgitation. No evidence of mitral valve stenosis. Tricuspid Valve: The tricuspid valve is normal in structure. Tricuspid valve regurgitation is not demonstrated. No evidence of tricuspid stenosis. Aortic Valve: The aortic valve is tricuspid. Aortic valve regurgitation is not visualized. Mild to moderate aortic valve sclerosis/calcification is present, without any evidence of aortic stenosis. Aortic valve mean gradient measures 6.0 mmHg. Aortic valve peak gradient measures 11.6 mmHg. Aortic valve area, by VTI measures 2.53 cm. Pulmonic Valve: The pulmonic valve was normal in structure. Pulmonic valve regurgitation is not visualized. No evidence of pulmonic stenosis. Aorta: The aortic root is normal in size and structure and aortic dilatation noted. There is mild dilatation of the ascending aorta, measuring 38 mm. Venous: The inferior vena cava is normal in size with greater than 50% respiratory variability, suggesting right atrial pressure of 3 mmHg. IAS/Shunts: The interatrial septum was not well visualized.  LEFT VENTRICLE PLAX 2D LVIDd:         4.20 cm  Diastology LVIDs:         2.80 cm  LV e' medial:    9.36 cm/s LV PW:         1.70 cm  LV E/e' medial:  13.1 LV IVS:        1.60 cm  LV e' lateral:   7.40 cm/s LVOT diam:     2.10 cm  LV E/e' lateral: 16.6 LV SV:         90 LV SV Index:   41  LVOT Area:     3.46 cm  RIGHT VENTRICLE RV Basal diam:  2.90 cm RV S prime:     12.60 cm/s LEFT ATRIUM             Index       RIGHT ATRIUM           Index LA diam:        3.20 cm 1.47 cm/m  RA Area:     23.20 cm LA Vol (A2C):   71.7 ml 32.94 ml/m RA Volume:   59.70 ml  27.42 ml/m LA Vol (A4C):   81.1 ml 37.25 ml/m LA Biplane Vol: 76.9 ml 35.32 ml/m  AORTIC VALVE AV Area (Vmax):    2.38 cm AV Area (Vmean):   2.37 cm AV Area (VTI):     2.53 cm AV Vmax:           170.00 cm/s AV Vmean:          116.000 cm/s AV VTI:            0.354 m AV  Peak Grad:      11.6 mmHg AV Mean Grad:      6.0 mmHg LVOT Vmax:         117.00 cm/s LVOT Vmean:        79.300 cm/s LVOT VTI:          0.259 m LVOT/AV VTI ratio: 0.73  AORTA Ao Asc diam: 3.80 cm MITRAL VALVE MV Area (PHT): 4.80 cm     SHUNTS MV Decel Time: 158 msec     Systemic VTI:  0.26 m MV E velocity: 123.00 cm/s  Systemic Diam: 2.10 cm MV A velocity: 65.50 cm/s MV E/A ratio:  1.88 Charlton Haws MD Electronically signed by Charlton Haws MD Signature Date/Time: 05/10/2020/5:08:01 PM    Final    CT Maxillofacial Wo Contrast  Result Date: 05/04/2020 CLINICAL DATA:  Patient found down.  Fall.  Headache last night. EXAM: CT HEAD WITHOUT CONTRAST CT MAXILLOFACIAL WITHOUT CONTRAST CT CERVICAL SPINE WITHOUT CONTRAST TECHNIQUE: Multidetector CT imaging of the head, cervical spine, and maxillofacial structures were performed using the standard protocol without intravenous contrast. Multiplanar CT image reconstructions of the cervical spine and maxillofacial structures were also generated. COMPARISON:  CT head 04/21/2012. FINDINGS: CT HEAD FINDINGS Brain: Small amount of subarachnoid hemorrhage in the sylvian fissure bilaterally. Mild diffuse subarachnoid hemorrhage including right occipital lobe. Basilar cisterns without significant subarachnoid hemorrhage. Bifrontal hemorrhagic contusions. Small amount of frontal subarachnoid hemorrhage. Small hemorrhagic contusion in the right cerebellum. Small left frontal subdural hematoma. Right subdural hematoma measuring approximately 6 mm in thickness over the convexity. Ventricle size normal.  No midline shift.  No acute infarct or mass. Vascular: No hyperdense vessel. Blood in the sylvian fissure obscures the middle cerebral artery bilaterally. Skull: Negative for skull fracture. Other: None CT MAXILLOFACIAL FINDINGS Osseous: Negative for facial fracture Orbits: No fracture of the orbit. Negative for orbital mass or hematoma. Normal globe. There is soft tissue swelling  overlying the left eye Sinuses: Mucosal edema throughout the paranasal sinuses without air-fluid level Soft tissues: Moderate soft tissue swelling overlying the left eye compatible with contusion. CT CERVICAL SPINE FINDINGS Alignment: Mild anterolisthesis C4-5 Skull base and vertebrae: Negative for fracture. Motion degraded study. Soft tissues and spinal canal: Negative Disc levels: Multilevel disc and facet degeneration throughout the cervical spine. Multilevel foraminal stenosis. Mild spinal stenosis C5-6 and C6-7. Upper chest: Not image Other: None IMPRESSION: 1. Bilateral small subdural  hematomas without midline shift. Diffuse subarachnoid hemorrhage which is predominately in the sylvian fissures with sparing of the basilar cisterns. There are bifrontal hemorrhagic contusions. There is a small left cerebellar hemorrhagic contusion. Pattern most consistent with trauma rather than aneurysm rupture. 2. Negative for facial fracture 3. Negative for cervical spine fracture. Electronically Signed   By: Marlan Palau M.D.   On: 05/04/2020 16:08    Labs:  Basic Metabolic Panel: Recent Labs  Lab 05/22/20 1353  NA 133*  K 4.8  CL 96*  CO2 24  GLUCOSE 122*  BUN 23  CREATININE 1.14  CALCIUM 8.9    CBC: No results for input(s): WBC, NEUTROABS, HGB, HCT, MCV, PLT in the last 168 hours.  CBG: No results for input(s): GLUCAP in the last 168 hours.  Brief HPI:   Timothy Case is a right-handed 65 y.o. male with history of CVA maintained on aspirin, Covid infection in March and then again in December 2021 hypertension hyperlipidemia.  Presented 05/04/2020 after being found down by his wife with altered mental status and aphasia.  Wife reported recent headache.  Admission chemistries unremarkable aside BUN 28 glucose 131 SARS providers positive urine culture no growth.  Cranial CT scan showed bilateral subdural hematomas without midline shift.  Diffuse subarachnoid hemorrhage predominantly in  the sylvian fissures with sparing of the basilar cisterns.  There was bifrontal hemorrhagic contusions.  Negative for facial fracture negative cervical fracture.  CT head and neck no vascular malformation.  Follow-up CT scan of the head expected blooming of hemorrhagic contusions in the bilateral inferior frontal lobes and right cerebellum.  Traumatic pattern subarachnoid hemorrhage with without increased or diffusion since prior tracing.  Slight increase in right frontal parietal subdural hematoma measuring up to 7 mm in thickness.  No hydrocephalus.  There were findings of nondisplaced right occipital bone fracture.  EEG negative for seizure.  Maintained on Keppra for seizure prophylaxis.  Neurosurgery follow-up Dr. Venetia Maxon as well as neurology services consulted advised conservative care.  Patient was cleared to initiate subcutaneous heparin for DVT prophylaxis.  Hospital course did require intubation for airway protection self extubated 05/06/2020.  Patient with bouts of atrial fibrillation RVR placed on intravenous Cardizem later discontinued currently maintained on Coreg.  Echocardiogram was completed showing ejection fraction of 60 to 65% grade 2 diastolic dysfunction.  In regards to patient's Covid positive testing follow-up rapid antigen test was negative it was felt that Covid positive PCR test indicated dead virus shedding rather than active infection and isolation discontinued.  He did have bouts of agitation and is requiring Precedex as well as Haldol.  His diet was slowly advanced.  Therapy evaluations completed due to patient's cognitive impairments TBI was admitted for a comprehensive rehab program.   Hospital Course: Timothy Case was admitted to rehab 05/12/2020 for inpatient therapies to consist of PT, ST and OT at least three hours five days a week. Past admission physiatrist, therapy team and rehab RN have worked together to provide customized collaborative inpatient rehab.   Pertaining to patient's TBI/SDH/nondisplaced right occipital bone fracture secondary to unwitnessed fall 05/04/2020 remained stable conservative care provided by neurosurgery.  Subcutaneous heparin for DVT prophylaxis no bleeding episodes.  Pain management with tramadol as needed for headaches.  Noted hospital bouts of sleep deprivation as well as agitation he was receiving Inderal as well as Seroquel as directed with excellent overall progress.  He was using trazodone at night as needed for sleep.  Patient initially required enclosure bed  for safety later discontinued.  He was receiving close followed by neuropsychology.  He had been on Keppra for seizure prophylaxis slowly weaned to off.  In regards to patient's atrial fibrillation cardiac rate remained controlled he will continue on low-dose Inderal and his Coreg is since been discontinued.  Blood pressure overall controlled on Norvasc as well as lisinopril.  He would need follow-up outpatient monitoring.  His diet has been advanced to a regular consistency.  Bouts of constipation resolved with laxative assistance.  In regards to patient recent COVID in March as well as again December 2021 rapid antigen test was negative was felt that Covid positive PCR test indicated dead virus shedding rather than active infection and isolation of been discontinued he remained afebrile.   Blood pressures were monitored on TID basis and controlled     Rehab course: During patient's stay in rehab weekly team conferences were held to monitor patient's progress, set goals and discuss barriers to discharge. At admission, patient required minimal assist sit to stand moderate assist 9 feet to person hand-held assist moderate assist supine to sit.  Max assist lower body dressing +2 physical assist toilet transfers  Physical exam.  Blood pressure 116/65 pulse 73 temperature 98.8 respirations 13 oxygen saturation is 90% room air Constitutional.  No acute distress HEENT.   Bilateral raccoon eyes Eyes.  Pupils round and reactive to light no discharge.nystagmus Neck.  Supple nontender no JVD without thyromegaly Cardiac regular rate rhythm not extra sounds or murmur heard Abdomen.  Soft nontender positive bowel sounds without rebound Respiratory effort normal no respiratory distress without wheeze Musculoskeletal.  No swelling or tenderness normal range of motion Skin.  Warm and dry Neurologic.  Dysarthric speech but intelligible awakens easily but falls asleep during exam.  Provides biographical information.  Limited insight and awareness.  CN exam grossly intact.  Bilateral upper extremity 4/5.  Lower extremities motor 3+/5 proximal to 4/5 distally bilaterally.  Light touch and pain since intact  He/She  has had improvement in activity tolerance, balance, postural control as well as ability to compensate for deficits. He/She has had improvement in functional use RUE/LUE  and RLE/LLE as well as improvement in awareness.  Patient with good overall progress improved participation reduced agitation during therapy sessions.  He is independent in his room due to improved balance and independence with functional mobility.  Continues to require supervision for gait ambulation greater than 100 feet.  Navigate stairs was supervision for safety.  He still continued to have some poor frustration tolerance which was discussed with his family.  He continues demonstrate deficits and attention awareness decreased problem solving decreased safety awareness decreased memory and decreased sitting balance as well as standing balance.  He did receive rest breaks as needed.  He can gather his belongings for activities day living and homemaking.  Speech therapy follow-up for cognition with patient and family with ongoing education.  Patient has required moderate cues to read simulated pill label to determine dosage of medication during pillbox task however he was able to read interpret in place  simulated medication and pillbox with only supervision.  He continues receive followed by neuropsychology.  Full family teaching completed plan discharged home       Disposition: Discharged to home    Diet: Regular  Special Instructions: No driving smoking or alcohol  Medications at discharge. 1.  Tylenol as needed 2.  Norvasc 10 mg p.o. daily 3.  Lisinopril 10 mg p.o. daily 4.  MiraLAX twice daily hold  for loose stools 5.  Inderal 120 mg p.o. daily 6.  Seroquel 50 mg QHS 7.  Crestor 20 mg p.o. daily 8.  Senokot 1 tablet p.o. twice daily 9.  Trazodone 50 mg p.o. nightly as needed sleep  30-35 minutes were spent completing discharge summary and discharge planning  Discharge Instructions    Ambulatory referral to Occupational Therapy   Complete by: As directed    Evaluate and treat   Ambulatory referral to Physical Medicine Rehab   Complete by: As directed    Moderate complexity follow-up 1 to 2 weeks TBI   Ambulatory referral to Physical Therapy   Complete by: As directed    Evaluate and treat   Ambulatory referral to Speech Therapy   Complete by: As directed    Evaluate and treat       Follow-up Information    Ranelle Oyster, MD Follow up.   Specialty: Physical Medicine and Rehabilitation Why: Office to call for appointment Contact information: [address] [place] [address] [phone]        Maeola Harman, MD Follow up.   Specialty: Neurosurgery Why: Call for appointment Contact information: [address] [place] [address] [phone]               Signed: Mcarthur Rossetti Daden Mahany 05/29/2020, 11:13 AM

## 2020-05-29 NOTE — Progress Notes (Signed)
Pt is independent in room. No bed alarm during night( per pt request) . Pt is educated on safety, fall precaution. Pt verbalize understanding.

## 2020-05-29 NOTE — Progress Notes (Signed)
Physical Therapy Discharge Summary  Patient Details  Name: Timothy Case MRN: [id number] Date of Birth: 1956/03/24  Today's Date: 05/29/2020   Patient has met 12 of 12 long term goals due to improved activity tolerance, improved balance, improved postural control, increased strength, decreased pain, ability to compensate for deficits, improved attention, improved awareness and improved coordination.  Patient to discharge at an ambulatory level Independent with mobility and 24/7 Supervision for safety, judgement, and recall.  Patient's care partner is independent to provide the necessary cognitive assistance at discharge.  Reasons goals not met: n/a  Recommendation:  Patient will benefit from ongoing skilled PT services in outpatient setting to continue to advance safe functional mobility, address ongoing impairments in balance, safety awareness, activity tolerance, community integration, exercise progression for return to recreational exercise and activity, dual task training, attention, path finding, patient/caregiver education, and minimize fall risk.  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, L eye slowing than R with tracking Perception Perception: Within Functional Limits Praxis Praxis: Intact  Cognition Overall Cognitive Status: Impaired/Different from baseline Arousal/Alertness: Awake/alert Orientation Level:  Oriented X4 Attention: Selective Sustained Attention: Appears intact Selective Attention: Impaired Selective Attention Impairment: Functional complex;Verbal complex Memory: Impaired Memory Impairment: Storage deficit;Decreased recall of new information Awareness: Impaired Awareness Impairment: Anticipatory impairment Problem Solving: Appears intact Behaviors: Poor frustration tolerance;Confabulation Safety/Judgment: Impaired Comments: 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 [employer] Scales of Cognitive Functioning: Purposeful/appropriate Sensation Sensation Light Touch: Appears Intact Proprioception: Appears Intact Coordination Gross Motor Movements are Fluid and Coordinated: Yes Fine Motor Movements are Fluid and Coordinated: Yes Heel Shin Test: WFL bilaterally Motor  Motor Motor: Within Functional Limits Motor - Discharge Observations: improved  overall, continues to have minor balanc impairments and decreased activity tolerance from baseline, however compensates well with stepping and reaching strategies  Mobility Bed Mobility Bed Mobility: Supine to Sit;Sit to Supine Supine to Sit: Independent Sit to Supine: Independent Transfers Transfers: Sit to Stand;Stand to Sit;Stand Pivot Transfers Sit to Stand: Independent Stand to Sit: Independent Stand Pivot Transfers: Independent Transfer (Assistive device): None Locomotion  Gait Ambulation: Yes Gait Assistance: Independent Gait Distance (Feet): 1373 Feet (6 Minute Walk Test) Assistive device: None Gait Gait Pattern: Step-through pattern;Decreased stride length;Decreased trunk rotation Gait velocity: 1.16 m/s High Level Ambulation High Level Ambulation: Other high level ambulation High Level Ambulation - Other Comments: light jogging with supervision for safety without LOB 20 ft x2, HR 80 after Stairs / Additional Locomotion Stairs: Yes Stairs Assistance: Supervision/Verbal cueing Stair Management  Technique: One rail Right;One rail Left Number of Stairs: 12 Height of Stairs: 6 Ramp: Supervision/Verbal cueing Curb: Supervision/Verbal cueing Wheelchair Mobility 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, petroleum)  Trunk/Postural Assessment  Cervical Assessment Cervical Assessment: Within Functional Limits Thoracic Assessment Thoracic Assessment: Exceptions to [facility] (baseline back pain) Lumbar Assessment Lumbar Assessment: Within Functional Limits Postural Control Postural Control: Deficits on evaluation (slightly impaired)  Balance Standardized Balance Assessment Standardized Balance Assessment: Functional Gait Assessment;Berg Balance Test Berg Balance Test Sit to Stand: Able to stand without using hands and stabilize independently Standing Unsupported: Able  to stand safely 2 minutes Sitting with Back Unsupported but Feet Supported on Floor or Stool: Able to sit safely and securely 2 minutes Stand to Sit: Sits safely with minimal use of hands Transfers: Able to transfer safely, minor use of hands Standing Unsupported with Eyes Closed: Able to stand 10 seconds safely Standing Ubsupported with Feet Together: Able to place feet together independently and stand for 1 minute with supervision From Standing, Reach Forward with Outstretched Arm: Can reach confidently >25 cm (10") From Standing Position, Pick up Object from Floor: Able to pick up shoe safely and easily From Standing Position, Turn to Look Behind Over each Shoulder: Looks behind from both sides and weight shifts well Turn 360 Degrees: Able to turn 360 degrees safely in 4 seconds or less Standing Unsupported, Alternately Place Feet on Step/Stool: Able to stand independently and safely and complete 8 steps in 20 seconds Standing Unsupported, One Foot in Front: Able to plae foot ahead of the other independently and hold 30 seconds Standing on One Leg: Tries to lift leg/unable to hold 3 seconds but remains standing  independently Total Score: 51/56 Static Sitting Balance Static Sitting - Level of Assistance: 7: Independent Dynamic Sitting Balance Dynamic Sitting - Level of Assistance: 7: Independent Static Standing Balance Static Standing - Level of Assistance: 7: Independent Dynamic Standing Balance Dynamic Standing - Level of Assistance: 7: Independent Functional Gait  Assessment Gait assessed : Yes Gait Level Surface: Walks 20 ft in less than 7 sec but greater than 5.5 sec, uses assistive device, slower speed, mild gait deviations, or deviates 6-10 in outside of the 12 in walkway width. Change in Gait Speed: Able to change speed, demonstrates mild gait deviations, deviates 6-10 in outside of the 12 in walkway width, or no gait deviations, unable to achieve a major change in velocity, or uses a change in velocity, or uses an assistive device. Gait with Horizontal Head Turns: Performs head turns smoothly with slight change in gait velocity (eg, minor disruption to smooth gait path), deviates 6-10 in outside 12 in walkway width, or uses an assistive device. Gait with Vertical Head Turns: Performs task with slight change in gait velocity (eg, minor disruption to smooth gait path), deviates 6 - 10 in outside 12 in walkway width or uses assistive device Gait and Pivot Turn: Pivot turns safely in greater than 3 sec and stops with no loss of balance, or pivot turns safely within 3 sec and stops with mild imbalance, requires small steps to catch balance. Step Over Obstacle: Is able to step over one shoe box (4.5 in total height) without changing gait speed. No evidence of imbalance. Gait with Narrow Base of Support: Ambulates 4-7 steps. Gait with Eyes Closed: Cannot walk 20 ft without assistance, severe gait deviations or imbalance, deviates greater than 15 in outside 12 in walkway width or will not attempt task. (pt initially declines to participate due to his impaired vision at baseline but then reconsiders and  is agreeable) Ambulating Backwards: Walks 20 ft, uses assistive device, slower speed, mild gait deviations, deviates 6-10 in outside 12 in walkway width. Steps: Alternating feet, must use rail. Total Score: 17/30 Extremity Assessment  RLE Assessment RLE Assessment: Within Functional Limits Active Range of Motion (AROM) Comments: WFL for functional mobility General Strength Comments: Grossly 5/5 throughout in sitting LLE Assessment LLE Assessment: Within Functional Limits Active Range of Motion (AROM) Comments: WFL for functional mobility General Strength Comments: Grossly 5/5 throughout in sitting    Josette Shimabukuro L  Zaxton Angerer PT, DPT  05/29/2020, 4:19 PM

## 2020-05-29 NOTE — Progress Notes (Signed)
[place] PHYSICAL MEDICINE & REHABILITATION PROGRESS NOTE   Subjective/Complaints: Feeling very well today. Would like to go home tomorrow if possible. Discussed with PT. Bradycardic to 58 Would prefer tramadol to be d/ced   ROS: Patient denies fever, rash, sore throat, blurred vision, nausea, vomiting, diarrhea, cough, shortness of breath or chest pain, joint or back pain,  or mood change.   Objective:   No results found. No results for input(s): WBC, HGB, HCT, PLT in the last 72 hours. No results for input(s): NA, K, CL, CO2, GLUCOSE, BUN, CREATININE, CALCIUM in the last 72 hours. No intake or output data in the 24 hours ending 05/29/20 0859      Physical Exam: Vital Signs Blood pressure 120/76, pulse (!) 58, temperature 97.9 F (36.6 C), resp. rate 18, height 5' 9.02" (1.753 m), weight 96.3 kg, SpO2 98 %. Gen: no distress, normal appearing HEENT: oral mucosa pink and moist, NCAT Cardio: Bradycardic Chest: normal effort, normal rate of breathing Abd: soft, non-distended Ext: no edema Psych: pleasant, normal affect Skin: intact Musc: No edema in extremities.  No tenderness in extremities. Neurologic oriented to person, place, date. Improving awareness. Motor: 4+ to 5/5.excellent sitting balance.  .   Assessment/Plan: 1. Functional deficits which require 3+ hours per day of interdisciplinary therapy in a comprehensive inpatient rehab setting.  Physiatrist is providing close team supervision and 24 hour management of active medical problems listed below.  Physiatrist and rehab team continue to assess barriers to discharge/monitor patient progress toward functional and medical goals  Care Tool:  Bathing    Body parts bathed by patient: Right arm,Left arm,Chest,Abdomen,Right upper leg,Left upper leg,Face   Body parts bathed by helper: Front perineal area,Buttocks,Right lower leg,Left lower leg     Bathing assist Assist Level: Moderate Assistance - Patient 50 - 74%      Upper Body Dressing/Undressing Upper body dressing   What is the patient wearing?: Hospital gown only,Pull over shirt    Upper body assist Assist Level: Minimal Assistance - Patient > 75%    Lower Body Dressing/Undressing Lower body dressing      What is the patient wearing?: Incontinence brief     Lower body assist Assist for lower body dressing: Minimal Assistance - Patient > 75%     Toileting Toileting    Toileting assist Assist for toileting: Independent Assistive Device Comment: Urinal   Transfers Chair/bed transfer  Transfers assist     Chair/bed transfer assist level: Independent     Locomotion Ambulation   Ambulation assist      Assist level: Supervision/Verbal cueing Assistive device: No Device Max distance: 300'   Walk 10 feet activity   Assist     Assist level: Supervision/Verbal cueing Assistive device: No Device   Walk 50 feet activity   Assist    Assist level: Supervision/Verbal cueing Assistive device: No Device    Walk 150 feet activity   Assist    Assist level: Supervision/Verbal cueing Assistive device: No Device    Walk 10 feet on uneven surface  activity   Assist Walk 10 feet on uneven surfaces activity did not occur: Safety/medical concerns   Assist level: Supervision/Verbal cueing Assistive device: Other (comment) (intermittent use of rail)   Wheelchair     Assist Will patient use wheelchair at discharge?: No             Wheelchair 50 feet with 2 turns activity    Assist  Wheelchair 150 feet activity     Assist          Blood pressure 120/76, pulse (!) 58, temperature 97.9 F (36.6 C), resp. rate 18, height 5' 9.02" (1.753 m), weight 96.3 kg, SpO2 98 %.    Medical Problem List and Plan: 1.TBI/SDH/nondisplaced right occipital bone fracturesecondary tounwitnessed fall 05/04/2020  Continue CIR PT, OT, SLP   -has made nice progress behaviorally and cognitively  this week  -ELOS 06/02/20, family conference today  -may be able to move his dc date up to 2/15 as per patient's preference as he's made tremendous progress, discussed with therapy and they are in agreement.  2. Antithrombotics: -DVT/anticoagulation:Subcutaneous heparin 05/08/2020 -antiplatelet therapy: N/A 3. Pain Management:pt agreed to try tramadol for headaches   -dc oxycodone   -tylenol prn   -tramadol d/ced as he does not like side effects (says he feels drugged, dry mouth), headaches are much improved, main pain is low back right now, kpad added 4. Mood/sleep:Melatonin 5 mg nightly as needed. -continue sleep chart -antipsychotic agents: seroquel  2/12 continue seroquel 100mg  qhs with 25mg  scheduled in morning   -seroquel 50mg  prn for moderate agitation   -propranolol XR 120mg  daily   -IM Ativan for severe agitation   -improved. Continue same meds 5. Neuropsych: This patientis notcapable of making decisions on hisown behalf. 6. Skin/Wound Care:Routine skin checks 7. Fluids/Electrolytes/Nutrition: -labs ok 2/7 8. Seizure prophylaxis. Keppra    Decreased to 750mg  bid on 2/3, reduce to 500mg  bid with PM dose 2/7  2/12 reduced to 250mg  bid yesterday. D/ced on 2/14 9. Atrial fibrillation with RVR. Intravenous Cardizem discontinued. dc Coreg 25 mg as below, start propranolol for behavioral benefits Controlled on 2/14, continue propanolol 10. Hypertension.   Vitals:   05/28/20 1940 05/29/20 0530  BP: 119/82 120/76  Pulse: 71 (!) 58  Resp: 18 18  Temp: 98 F (36.7 C) 97.9 F (36.6 C)  SpO2: 99% 98%    Continue propranolol in place of coreg--change to XR form  continue norvasc and lisinopril 11. Decreased nutritional storage/Dysphagia:  Diet has been advanced to regular.  12. History of alcohol use. Monitor for any signs of withdrawal 13. Constipation. MiraLAX as directed. Adjust bowel program as  needed.  Moving bowels 14. History recent Covid March and then again December 2021. Rapid antigen test was negative. It was felt that Covid positive PCR test indicated dead virus shedding rather than active infection and isolation discontinued. 15.  Hyponatremia  Sodium 133 on 2/7   Continue to monitor  LOS: 17 days A FACE TO FACE EVALUATION WAS PERFORMED  3/14 P Hoa Deriso 05/29/2020, 8:59 AM

## 2020-05-29 NOTE — Plan of Care (Signed)
  Problem: RH BOWEL ELIMINATION Goal: RH STG MANAGE BOWEL WITH ASSISTANCE Description: STG Manage Bowel with mod I Assistance. Outcome: Progressing   Problem: RH BLADDER ELIMINATION Goal: RH STG MANAGE BLADDER WITH ASSISTANCE Description: STG Manage Bladder With mod I Assistance Outcome: Progressing   Problem: RH SKIN INTEGRITY Goal: RH STG SKIN FREE OF INFECTION/BREAKDOWN Outcome: Progressing Goal: RH STG MAINTAIN SKIN INTEGRITY WITH ASSISTANCE Description: STG Maintain Skin Integrity With mod I Assistance. Outcome: Progressing   Problem: RH SAFETY Goal: RH STG ADHERE TO SAFETY PRECAUTIONS W/ASSISTANCE/DEVICE Description: STG Adhere to Safety Precautions With min Assistance/Device. Outcome: Progressing   Problem: RH KNOWLEDGE DEFICIT BRAIN INJURY Goal: RH STG INCREASE KNOWLEDGE OF SELF CARE AFTER BRAIN INJURY Outcome: Progressing

## 2020-05-29 NOTE — Progress Notes (Signed)
Occupational Therapy Discharge Summary  Patient Details  Name: Timothy Case MRN: [id number] Date of Birth: Jan 05, 1956  Today's Date: 05/29/2020 OT Individual Time: 0930-1030 OT Individual Time Calculation (min): 60 min    Patient has met 13 of 13 long term goals due to improved activity tolerance, improved balance, postural control, ability to compensate for deficits, improved attention, improved awareness and improved coordination.  Patient to discharge at overall Modified Independent level.  Patient's care partner is independent to provide the necessary cognitive assistance at discharge. Family education has been completed with pt's wife [name] and son Merrilee Seashore.    Reasons goals not met: All treatment goals met.   Recommendation:  Patient will benefit from ongoing skilled OT services in outpatient setting to continue to advance functional skills in the area of iADL and Vocation.  Equipment: No equipment provided  Reasons for discharge: treatment goals met and discharge from hospital  Patient/family agrees with progress made and goals achieved: Yes   Skilled OT Intervention: Pt received in recliner with c/o HA. Extended discussion re d/c, brain fog, medication management, and sleepiness. MD bringing up d/c a day earlier (tomorrow instead of 2/16). Pt has already met OT and PT goals and would be ok for d/c. Called pt's wife to confirm if earlier d/c would be ok- she was agreeable. Pt completed ambulation around unit during session at mod I, holding appropriate conversation with OT and demonstrating improved dual processing. He completed higher level dual processing tasks, completing resistive squats with overhead press while completing simple to moderate math problems with 90% accuracy. Discussed insight into deficits and importance of obtaining family assistance with judgement calls. Pt returned to his room and was left sitting up with all needs met.   OT  Discharge Precautions/Restrictions  Precautions Precautions: Other (comment) Precaution Comments: RLAS VIII Restrictions Weight Bearing Restrictions: No Pain Pain Assessment Pain Scale: 0-10 Pain Score: 0-No pain ADL ADL Eating: Independent Where Assessed-Eating: Chair Grooming: Independent Where Assessed-Grooming: Standing at sink Upper Body Bathing: Minimal assistance,Independent Where Assessed-Upper Body Bathing: Standing at sink Lower Body Bathing: Independent Where Assessed-Lower Body Bathing: Standing at sink Upper Body Dressing: Independent Where Assessed-Upper Body Dressing: Standing at sink Lower Body Dressing: Independent Where Assessed-Lower Body Dressing: Standing at sink Toileting: Independent Where Assessed-Toileting: Glass blower/designer: Programmer, applications Method: Arts development officer: Energy manager: Theatre stage manager Method: Psychologist, educational: IT consultant Method: Stand pivot Vision Baseline Vision/History: No visual deficits Patient Visual Report: No change from baseline (L eye blindness) Vision Assessment?: No apparent visual deficits Additional Comments: L eye blindness Perception  Perception: Within Functional Limits Praxis Praxis: Intact Cognition Overall Cognitive Status: Impaired/Different from baseline Arousal/Alertness: Awake/alert Orientation Level: Oriented X4 Attention: Selective Selective Attention: Impaired Selective Attention Impairment: Functional complex;Verbal complex Memory: Impaired Memory Impairment: Storage deficit;Decreased recall of new information Awareness: Impaired Awareness Impairment: Anticipatory impairment Problem Solving: Appears intact Safety/Judgment: Impaired Comments: 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 [employer] Scales of Cognitive Functioning:  Purposeful/appropriate Sensation Sensation Light Touch: Appears Intact Hot/Cold: Appears Intact Proprioception: Appears Intact Stereognosis: Appears Intact Coordination Gross Motor Movements are Fluid and Coordinated: Yes Fine Motor Movements are Fluid and Coordinated: Yes Motor  Motor Motor: Within Functional Limits Mobility  Bed Mobility Bed Mobility: Supine to Sit;Sit to Supine Supine to Sit: Independent Sit to Supine: Independent Transfers Sit to Stand: Independent Stand to Sit: Independent  Trunk/Postural Assessment  Cervical Assessment Cervical Assessment: Within Functional Limits Thoracic  Assessment Thoracic Assessment: Exceptions to [facility] (baseline back pain) Lumbar Assessment Lumbar Assessment: Within Functional Limits Postural Control Postural Control: Deficits on evaluation (slightly impaired)  Balance Balance Balance Assessed: Yes Static Sitting Balance Static Sitting - Balance Support: Feet supported Static Sitting - Level of Assistance: 7: Independent Dynamic Sitting Balance Dynamic Sitting - Balance Support: Feet supported Dynamic Sitting - Level of Assistance: 7: Independent Static Standing Balance Static Standing - Balance Support: During functional activity;Left upper extremity supported Static Standing - Level of Assistance: 7: Independent Dynamic Standing Balance Dynamic Standing - Balance Support: During functional activity Dynamic Standing - Level of Assistance: 7: Independent Extremity/Trunk Assessment RUE Assessment RUE Assessment: Within Functional Limits LUE Assessment LUE Assessment: Within Functional Limits   Curtis Sites 05/29/2020, 10:26 AM

## 2020-05-29 NOTE — Progress Notes (Addendum)
Patient ID: Timothy Case, male   DOB: 07-01-1955, 65 y.o.   MRN: [id number]  Per medical team, pt can d/c tomorrow. Sw confirmed with pt wife [name] [phone]) that she remains in agreement after speaking with therapy. SW informed on outpatient PT/OT/SLP referral faxed to [facility] and there will be follow-up from clinic to schedule appointment.   SW spoke with [place] at [facility] (p:[phone]/f:[phone]) to inform on change in d/c date from 2/18 to 2/15.   Cecile Sheerer, MSW, LCSWA Office: [phone] Cell: [phone] Fax: [phone]

## 2020-05-29 NOTE — Discharge Instructions (Signed)
Inpatient Rehab Discharge Instructions  [facility] Discharge date and time: No discharge date for patient encounter.   Activities/Precautions/ Functional Status: Activity: activity as tolerated Diet: regular diet Wound Care: Routine skin checks Functional status:  ___ No restrictions     ___ Walk up steps independently ___ 24/7 supervision/assistance   ___ Walk up steps with assistance ___ Intermittent supervision/assistance  ___ Bathe/dress independently ___ Walk with walker     _x__ Bathe/dress with assistance ___ Walk Independently    ___ Shower independently ___ Walk with assistance    ___ Shower with assistance ___ No alcohol     ___ Return to work/school ________  COMMUNITY REFERRALS UPON DISCHARGE:    Outpatient: PT     OT    ST             Agency:[facility]      Phone: [phone]              Appointment Date/Time:*Please expect follow-up within 7-10 business days to schedule your appointment. If you have not received follow-up, be sure to contact the site directly.*  Medical Equipment/Items Ordered: no equipment needed                                                 Agency/Supplier: N/A    Special Instructions: No driving smoking or alcohol   My questions have been answered and I understand these instructions. I will adhere to these goals and the provided educational materials after my discharge from the hospital.  Patient/Caregiver Signature _______________________________ Date __________  Clinician Signature _______________________________________ Date __________  Please bring this form and your medication list with you to all your follow-up doctor's appointments.

## 2020-05-30 ENCOUNTER — Encounter: Payer: Self-pay | Admitting: Psychology

## 2020-05-30 MED ORDER — AMLODIPINE BESYLATE 10 MG PO TABS: 10.0000 mg | ORAL_TABLET | Freq: Every day | ORAL | 0 refills | Status: DC

## 2020-05-30 MED ORDER — ROSUVASTATIN CALCIUM 20 MG PO TABS: 20.0000 mg | ORAL_TABLET | Freq: Every day | ORAL | 0 refills | Status: AC

## 2020-05-30 MED ORDER — ACETAMINOPHEN 325 MG PO TABS: 650.0000 mg | ORAL_TABLET | ORAL | Status: AC | PRN

## 2020-05-30 MED ORDER — PROPRANOLOL HCL ER 120 MG PO CP24
120.0000 mg | ORAL_CAPSULE | Freq: Every day | ORAL | 0 refills | Status: DC
Start: 2020-05-31 — End: 2020-06-14

## 2020-05-30 MED ORDER — LISINOPRIL 10 MG PO TABS: 10.0000 mg | ORAL_TABLET | Freq: Every day | ORAL | 0 refills | Status: DC

## 2020-05-30 MED ORDER — QUETIAPINE FUMARATE 50 MG PO TABS: 50.0000 mg | ORAL_TABLET | Freq: Every day | ORAL | 0 refills | Status: DC

## 2020-05-30 MED ORDER — QUETIAPINE FUMARATE 50 MG PO TABS
50.0000 mg | ORAL_TABLET | Freq: Every day | ORAL | Status: DC
Start: 2020-05-30 — End: 2020-05-30

## 2020-05-30 MED ORDER — TRAZODONE HCL 50 MG PO TABS: 50.0000 mg | ORAL_TABLET | Freq: Every evening | ORAL | 0 refills | Status: DC | PRN

## 2020-05-30 NOTE — Progress Notes (Signed)
[place] PHYSICAL MEDICINE & REHABILITATION PROGRESS NOTE   Subjective/Complaints: Happy that he's feeling more like himself. Bowel habits returning more to normal. Denies significant pain this morning  ROS: Patient denies fever, rash, sore throat, blurred vision, nausea, vomiting, diarrhea, cough, shortness of breath or chest pain, joint or back pain, headache, or mood change.    Objective:   No results found. No results for input(s): WBC, HGB, HCT, PLT in the last 72 hours. No results for input(s): NA, K, CL, CO2, GLUCOSE, BUN, CREATININE, CALCIUM in the last 72 hours.  Intake/Output Summary (Last 24 hours) at 05/30/2020 0855 Last data filed at 05/30/2020 0710 Gross per 24 hour  Intake 480 ml  Output --  Net 480 ml        Physical Exam: Vital Signs Blood pressure 108/76, pulse 80, temperature 97.7 F (36.5 C), resp. rate 18, height 5' 9.02" (1.753 m), weight 95.8 kg, SpO2 97 %. Constitutional: No distress . Vital signs reviewed. HEENT: EOMI, oral membranes moist Neck: supple Cardiovascular: RRR without murmur. No JVD    Respiratory/Chest: CTA Bilaterally without wheezes or rales. Normal effort    GI/Abdomen: BS +, non-tender, non-distended Ext: no clubbing, cyanosis, or edema Psych: pleasant and cooperative Skin: intact Musc: No edema in extremities.  No tenderness in extremities. Neurologic oriented to person, place, date. Improved awareness and insight. Good sitting balance. Motor 5/5 Assessment/Plan: 1. Functional deficits which require 3+ hours per day of interdisciplinary therapy in a comprehensive inpatient rehab setting.  Physiatrist is providing close team supervision and 24 hour management of active medical problems listed below.  Physiatrist and rehab team continue to assess barriers to discharge/monitor patient progress toward functional and medical goals  Care Tool:  Bathing    Body parts bathed by patient: Right arm,Left arm,Chest,Abdomen,Right  upper leg,Left upper leg,Right lower leg,Left lower leg,Face,Front perineal area,Buttocks   Body parts bathed by helper: Front perineal area,Buttocks,Right lower leg,Left lower leg     Bathing assist Assist Level: Independent     Upper Body Dressing/Undressing Upper body dressing   What is the patient wearing?: Pull over shirt    Upper body assist Assist Level: Independent    Lower Body Dressing/Undressing Lower body dressing      What is the patient wearing?: Underwear/pull up,Pants     Lower body assist Assist for lower body dressing: Independent     Toileting Toileting    Toileting assist Assist for toileting: Independent Assistive Device Comment: Urinal   Transfers Chair/bed transfer  Transfers assist     Chair/bed transfer assist level: Independent     Locomotion Ambulation   Ambulation assist      Assist level: Independent Assistive device: No Device Max distance: 1373 ft   Walk 10 feet activity   Assist     Assist level: Independent Assistive device: No Device   Walk 50 feet activity   Assist    Assist level: Independent Assistive device: No Device    Walk 150 feet activity   Assist    Assist level: Independent Assistive device: No Device    Walk 10 feet on uneven surface  activity   Assist Walk 10 feet on uneven surfaces activity did not occur: Safety/medical concerns   Assist level: Supervision/Verbal cueing Assistive device: Other (comment) (intermittent use of rail)   Wheelchair     Assist Will patient use wheelchair at discharge?: No (patient is a functional ambulator)   Wheelchair activity did not occur: N/A  Wheelchair 50 feet with 2 turns activity    Assist    Wheelchair 50 feet with 2 turns activity did not occur: N/A       Wheelchair 150 feet activity     Assist  Wheelchair 150 feet activity did not occur: N/A       Blood pressure 108/76, pulse 80, temperature 97.7 F  (36.5 C), resp. rate 18, height 5' 9.02" (1.753 m), weight 95.8 kg, SpO2 97 %.    Medical Problem List and Plan: 1.TBI/SDH/nondisplaced right occipital bone fracturesecondary tounwitnessed fall 05/04/2020  -has made tremendous progress!  -dc home today with outpt f/[facility]  -Patient to see me in the office for transitional care encounter in 1-2 weeks.  2. Antithrombotics: -DVT/anticoagulation:Subcutaneous heparin 05/08/2020 -antiplatelet therapy: N/A 3. Pain Management:tylenol, kpad for back  -h/a minimal at this point 4. Mood/sleep:Melatonin 5 mg nightly as needed. -continue sleep chart -antipsychotic agents: seroquel  2/15---continue seroquel 50mg  qhs and inderal LA 120   -dc am seroquel 5. Neuropsych: This patientis notcapable of making decisions on hisown behalf. 6. Skin/Wound Care:Routine skin checks 7. Fluids/Electrolytes/Nutrition: -labs ok 2/7 8. Seizure prophylaxis. Keppra    Decreased to 750mg  bid on 2/3, reduce to 500mg  bid with PM dose 2/7  2/12 reduced to 250mg  bid yesterday. D/ced on 2/14 9. Atrial fibrillation with RVR. Intravenous Cardizem discontinued. dc Coreg 25 mg as below, start propranolol for behavioral benefits Controlled on 2/15, continue propanolol 10. Hypertension.   Vitals:   05/29/20 1947 05/30/20 0416  BP: 99/70 108/76  Pulse: 70 80  Resp: 18 18  Temp: 98.6 F (37 C) 97.7 F (36.5 C)  SpO2: 99% 97%     continue norvasc and lisinopril  -outpt cards f/u 11. Decreased nutritional storage/Dysphagia:  Diet has been advanced to regular.  12. History of alcohol use. Monitor for any signs of withdrawal 13. Constipation. MiraLAX as directed. Adjust bowel program as needed.  Moving bowels 14. History recent Covid March and then again December 2021. Rapid antigen test was negative. It was felt that Covid positive PCR test indicated dead virus shedding  rather than active infection and isolation discontinued. 15.  Hyponatremia  Sodium 133 on 2/7   Continue to monitor  LOS: 18 days A FACE TO FACE EVALUATION WAS PERFORMED  06/01/20 05/30/2020, 8:55 AM

## 2020-05-30 NOTE — Progress Notes (Addendum)
Inpatient Rehabilitation Care Coordinator Discharge Note  The overall goal for the admission was met for:   Discharge location: Yes. D/c to home with 24/7 care.  Length of Stay: Yes. 17 days.   Discharge activity level: Yes. Supervision.  Home/community participation: Yes  Services provided included: MD, RD, PT, OT, SLP, RN, CM, TR, Pharmacy, Neuropsych and SW  Financial Services: Medicare and Private Insurance: [place] offered to/list presented to:Yes  Follow-up services arranged: Outpatient: [facility] for outpatient PT/OT/SLP and DME: No DME   Comments (or additional information):  Patient/Family verbalized understanding of follow-up arrangements: Yes  Individual responsible for coordination of the follow-up plan: Pt wife to assist with coordinating care needs. Contact [name] [phone]  Confirmed correct DME delivered: [name] 05/30/2020    [name]

## 2020-05-30 NOTE — Progress Notes (Signed)
Speech Language Pathology Discharge Summary  Patient Details  Name: Timothy Case MRN: [id number] Date of Birth: [birth year]  Patient has met 7 of 7 long term goals.  Patient to discharge at overall Supervision;Min level.   Reasons goals not met: N/A    Clinical Impression/Discharge Summary: Patient has made functional gains and has met 7 of 7 LTGs this admission. Currently, patient is consuming regular textures with thin liquids without overt s/s of aspiration and is overall Mod I for use of swallowing compensatory strategies. Patient demonstrates behaviors consistent with a Rancho Level VIII and requires overall Min A verbal cues to complete functional and mildly complex tasks safely in regard to attention, problem solving, recall and awareness. Patient and family education is complete and patient will discharge home with 24 hour supervision from family. Patient would benefit from f/u outpatient SLP services to maximize his cognitive functioning and overall functional independence in order to reduce caregiver burden.   Care Partner:  Caregiver Able to Provide Assistance: Yes  Type of Caregiver Assistance: Cognitive  Recommendation:  Outpatient SLP;24 hour supervision/assistance  Rationale for SLP Follow Up: Reduce caregiver burden;Maximize cognitive function and independence   Equipment: N/A   Reasons for discharge: Discharged from hospital;Treatment goals met   Patient/Family Agrees with Progress Made and Goals Achieved: Yes    [place], [place] 05/30/2020, 6:38 AM

## 2020-06-01 ENCOUNTER — Ambulatory Visit: Payer: Medicare Other | Admitting: Occupational Therapy

## 2020-06-01 ENCOUNTER — Ambulatory Visit: Payer: Medicare Other | Attending: Physician Assistant | Admitting: Physical Therapy

## 2020-06-01 ENCOUNTER — Other Ambulatory Visit: Payer: Self-pay

## 2020-06-01 ENCOUNTER — Encounter: Payer: Self-pay | Admitting: Occupational Therapy

## 2020-06-01 VITALS — BP 116/81 | HR 83

## 2020-06-01 DIAGNOSIS — R41841 Cognitive communication deficit: Secondary | ICD-10-CM | POA: Diagnosis present

## 2020-06-01 DIAGNOSIS — R2681 Unsteadiness on feet: Secondary | ICD-10-CM | POA: Diagnosis present

## 2020-06-01 DIAGNOSIS — R2689 Other abnormalities of gait and mobility: Secondary | ICD-10-CM | POA: Insufficient documentation

## 2020-06-01 DIAGNOSIS — R41844 Frontal lobe and executive function deficit: Secondary | ICD-10-CM | POA: Insufficient documentation

## 2020-06-01 DIAGNOSIS — R41842 Visuospatial deficit: Secondary | ICD-10-CM | POA: Insufficient documentation

## 2020-06-01 DIAGNOSIS — R41840 Attention and concentration deficit: Secondary | ICD-10-CM

## 2020-06-01 DIAGNOSIS — M6281 Muscle weakness (generalized): Secondary | ICD-10-CM | POA: Insufficient documentation

## 2020-06-01 NOTE — Therapy (Signed)
[facility] [address] [place], [place], [address] Phone: [phone]   Fax:  [phone]  Occupational Therapy Evaluation  Patient Details  Name: Donnie Panik MRN: [id number] Date of Birth: 15-Oct-1955 Referring Provider (OT): Jocelyn Lamer (MD)   Encounter Date: 06/01/2020   OT End of Session - 06/01/20 1036    Visit Number 1    Number of Visits 9    Date for OT Re-Evaluation 06/29/20    Authorization Type Medicare A&B/[employer] Managed    Authorization Time Period Follow Medicare Guidelines  10th visit PN  KX Modifier    Authorization - Number of Visits 10    Progress Note Due on Visit 10    OT Start Time 1102    OT Stop Time 1143    OT Time Calculation (min) 41 min    Activity Tolerance Patient tolerated treatment well    Behavior During Therapy Restless;WFL for tasks assessed/performed           Past Medical History:  Diagnosis Date  . Chronic lower back pain    "q am; cause I'm overweight" (04/21/2012)  . Essential familial hyperlipidemia   . GERD (gastroesophageal reflux disease)   . Hyperlipidemia   . Hypertension   . Shortness of breath    "lying down; sometimes" (04/21/2012)  . Stroke (HCC) 04/21/2012   "they are leaning towards mini stroke today; all S/S still gone now except little chest pain" (04/21/2012)    Past Surgical History:  Procedure Laterality Date  . KNEE ARTHROSCOPY  ~ 2008   "both knees; ~ 6 months apart" (04/21/2012)    There were no vitals filed for this visit.   Subjective Assessment - 06/01/20 1024    Subjective  Pt denies any pain. Pt reports his primary deficit is that he gets dizzy when he stands up too fast. Pt presents with spouse accompanying him. Pt is not driving currently.    Patient is accompanied by: Family member   spouse   Pertinent History hx of CVA, Covid x 2 (2021), HTN, HLD    Limitations no driving. falls.    Currently in Pain? No/denies              [facility] OT Assessment - 06/01/20 1015      Assessment   Medical Diagnosis Traumatic Brain Injury    Referring Provider (OT) Jocelyn Lamer (MD)    Onset Date/Surgical Date 05/04/20    Hand Dominance Right    Prior Therapy Inpatient Rehab      Balance Screen   Has the patient fallen in the past 6 months No      Home  Environment   Family/patient expects to be discharged to: Private residence    Living Arrangements Spouse/significant other    Type of Home House    Home Access Stairs   1   Home Layout Able to live on main level with bedroom/bathroom    Bathroom Shower/Tub Walk-in Programmer, applications Yes      Prior Function   Level of Independence Independent    Vocation Retired    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 Pt reports no difficulty with any aspect of basic ADLs.      IADL   Prior Level of Function Shopping did prior  Shopping Needs to be accompanied on any shopping trip;Assistance for transportation    Prior Level of Function Light Housekeeping did not do laundry - does cleaning prior    Light Housekeeping Performs light daily tasks such as dishwashing, bed making    Prior Level of Function Meal Prep prior cooking - culinary arts    Meal Prep Able to complete simple warm meal prep;Able to complete simple cold meal and snack prep   has not done but reports he could do with no problem   Prior Level of Function Community Mobility driving before    [employer] on family or friends for transportation    Prior Level of Function Medication Managment independent    Medication Management Takes responsibility if medication is prepared in advance in seperate dosage   reports he could   Prior Level of Function Financial Management does Scientist, forensic financial matters independently  (budgets, writes checks, pays rent, bills goes to bank), collects and keeps track of income      Mobility   Mobility Status Comments ambulating without device.      Written Expression   Dominant Hand Right   reports no difficulty with handwriting   Handwriting 100% legible      Vision - History   Baseline Vision Wears glasses all the time    Additional Comments L eye blindness since birth      Vision Assessment   Comment pt reports some deficits with seeing farther away with accident but not sure      Cognition   Overall Cognitive Status Cognition to be further assessed in functional context PRN   pt and pt spouse did not report any cognitive deficits     Observation/Other Assessments   Focus on Therapeutic Outcomes (FOTO)  N/A      Sensation   Light Touch Appears Intact    Hot/Cold Appears Intact    Proprioception Appears Intact      Coordination   9 Hole Peg Test Right;Left    Right 9 Hole Peg Test 27.47    Left 9 Hole Peg Test 31.28      ROM / Strength   AROM / PROM / Strength Strength;AROM      AROM   Overall AROM  Deficits    Overall AROM Comments LUE deficits; RUE WFL   LUE deficits from previous injury in 2015   AROM Assessment Site Shoulder    Right/Left Shoulder Left    Left Shoulder Flexion 125 Degrees    Left Shoulder ABduction 115 Degrees      Strength   Overall Strength Within functional limits for tasks performed    Overall Strength Comments BUE WFL      Hand Function   Right Hand Gross Grasp Functional    Right Hand Grip (lbs) 85.9    Left Hand Gross Grasp Functional    Left Hand Grip (lbs) 81.7                           OT Education - 06/01/20 1037    Education Details Education provided on role and purpose of OT    Person(s) Educated Patient    Methods Explanation    Comprehension Verbalized understanding            OT Short Term Goals - 06/01/20 1539      OT SHORT TERM GOAL #1   Title LTGs only.  OT Long Term Goals - 06/01/20 1535      OT LONG TERM GOAL #1   Title Pt will be independent with HEP for general strengthening BUE 06/29/2020    Time 4    Period Weeks    Status New    Target Date 06/29/20      OT LONG TERM GOAL #2   Title Pt will perform physical and cognitive task simultaneously with 100% Accuracy    Time 4    Period Weeks    Status New      OT LONG TERM GOAL #3   Title Pt will complete a mod level cognitive task while completing cooking with mod I and good safety awareness    Time 4    Period Weeks    Status New      OT LONG TERM GOAL #4   Title Continue to assess vision and set goal PRN    Time 4    Period Weeks    Status New      OT LONG TERM GOAL #5   Title Pt and caregiver will verbalize understanding of return to driving recommendations    Time 4    Period Weeks    Status New                 Plan - 06/01/20 1016    Clinical Impression Statement Pt is a 65 year old male that presents to Neuro OPOT s/p Traumatic Brain Injury on 05/04/2020. Pt with history of CVA maintained on aspirin, Covid infection in March and then again in December 2021, hypertension, hyperlipidemia.  Presented 05/04/2020 after being found down by his wife with altered mental status and aphasia.  Wife had reported headache. Cranial CT scan showed bilateral subdural hematomas without midline shift.  Diffuse subarachnoid hemorrhage predominantly in the sylvian fissures with sparing of the basilar cisterns.  There were bifrontal hemorrhagic contusions. Pt presents with unsteadiness on feet, cognitive deficits of self-awareness and understanding. Skilled occupational therapy is recommended to target listed areas of deficit and increase independence and decrease caregiver burden.    OT Occupational Profile and History Problem Focused Assessment - Including review of records relating to presenting problem    Occupational performance deficits (Please refer to evaluation for details):  ADL's;IADL's;Leisure    Body Structure / Function / Physical Skills IADL;Decreased knowledge of precautions;Decreased knowledge of use of DME;FMC;UE functional use;GMC;Endurance;Strength;Vision    Cognitive Skills Attention;Safety Awareness;Understand;Perception    Rehab Potential Good    Clinical Decision Making Limited treatment options, no task modification necessary    Comorbidities Affecting Occupational Performance: None    Modification or Assistance to Complete Evaluation  No modification of tasks or assist necessary to complete eval    OT Frequency 2x / week    OT Duration 4 weeks   or 8 visits + eval   OT Treatment/Interventions Self-care/ADL training;Moist Heat;DME and/or AE instruction;Manual Therapy;Cognitive remediation/compensation;Therapeutic activities;Functional Mobility Training;Energy conservation;Patient/family education;Visual/perceptual remediation/compensation;Therapeutic exercise    Plan Assess higher level cognition and vision (report blurriness far away)    Consulted and Agree with Plan of Care Patient;Family member/caregiver    Family Member Consulted spouse           Patient will benefit from skilled therapeutic intervention in order to improve the following deficits and impairments:   Body Structure / Function / Physical Skills: IADL,Decreased knowledge of precautions,Decreased knowledge of use of DME,FMC,UE functional use,GMC,Endurance,Strength,Vision Cognitive Skills: Attention,Safety Awareness,Understand,Perception     Visit Diagnosis: Frontal lobe and  executive function deficit - Plan: Ot plan of care cert/re-cert  Attention and concentration deficit - Plan: Ot plan of care cert/re-cert  Visuospatial deficit - Plan: Ot plan of care cert/re-cert  Muscle weakness (generalized) - Plan: Ot plan of care cert/re-cert    Problem List Patient Active Problem List   Diagnosis Date Noted  . PAF (paroxysmal atrial fibrillation) (HCC)   . Hyponatremia   .  Slow transit constipation   . Essential hypertension   . TBI (traumatic brain injury) (HCC) 05/12/2020  . Contusion of face   . Subarachnoid hemorrhage following injury, with loss of consciousness (HCC)   . Agitation   . Hypokalemia   . Subdural hematoma (HCC) 05/04/2020  . Subarachnoid bleed (HCC) 05/04/2020  . Nonallopathic lesion of sacral region 05/06/2017  . Nonallopathic lesion of lumbar region 05/06/2017  . Nonallopathic lesion of thoracic region 05/06/2017  . 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 Acute encephalopathy 04/22/2012  . TIA (transient ischemic attack) 04/22/2012  . HTN (hypertension) 04/21/2012  . Delirium 04/21/2012  . Obesity 04/21/2012  . Hyperlipidemia 04/21/2012  . GERD (gastroesophageal reflux disease) 04/21/2012    Junious DresserKirstyn M Lumen Brinlee MOT, OTR/L  06/01/2020, 3:44 PM  [facility] [address] [place], [place], [address] Phone: [phone]   Fax:  [phone]  Name: Vito BackersRoberto Domingo Mcphatter MRN: [id number] Date of Birth: October 30, 1955

## 2020-06-02 ENCOUNTER — Ambulatory Visit: Payer: Medicare Other

## 2020-06-02 VITALS — BP 116/79

## 2020-06-02 DIAGNOSIS — R41841 Cognitive communication deficit: Secondary | ICD-10-CM

## 2020-06-02 DIAGNOSIS — R2689 Other abnormalities of gait and mobility: Secondary | ICD-10-CM | POA: Diagnosis not present

## 2020-06-02 NOTE — Therapy (Signed)
[facility] [address] [place], [place], [address] Phone: [phone]   Fax:  [phone]  Physical Therapy Evaluation  Patient Details  Name: Timothy Case MRN: [id number] Date of Birth: 07/25/1955 Referring Provider (PT): Deatra Ina, PA-C; to be followed by Dr. Riley Kill   Encounter Date: 06/01/2020   PT End of Session - 06/02/20 1341    Visit Number 1    Number of Visits 9    Date for PT Re-Evaluation 08/01/20    Authorization Type Medicare    PT Start Time 1020    PT Stop Time 1101    PT Time Calculation (min) 41 min    Activity Tolerance Patient tolerated treatment well    Behavior During Therapy Restless;WFL for tasks assessed/performed           Past Medical History:  Diagnosis Date  . Chronic lower back pain    "q am; cause I'm overweight" (04/21/2012)  . Essential familial hyperlipidemia   . GERD (gastroesophageal reflux disease)   . Hyperlipidemia   . Hypertension   . Shortness of breath    "lying down; sometimes" (04/21/2012)  . Stroke (HCC) 04/21/2012   "they are leaning towards mini stroke today; all S/S still gone now except little chest pain" (04/21/2012)    Past Surgical History:  Procedure Laterality Date  . KNEE ARTHROSCOPY  ~ 2008   "both knees; ~ 6 months apart" (04/21/2012)    Vitals:   06/01/20 1023 06/01/20 1036  BP: 114/86 116/81  Pulse: 83   SpO2: 99%       Subjective Assessment - 06/01/20 1023    Subjective Dizzy all the time.  When asked, pt reports having unsteadiness and lightheadedness, especially upon first standing, "feels like a gush of blood to my head.".  Sometimes with walking, there are balance challenges.  Feels like I'm getting a little better everyday.  Feel steadier later in the day than earlier in the day.  I've lost a lot of muscle while being in the hospital for 29 days.    Patient Stated Goals PT goals to get back to normality.  [place]    Currently in Pain? Yes   Pt feels headache is related to wearing the facemask.   Pain Score 5     Pain Location Head    Pain Descriptors / Indicators Aching    Pain Frequency Intermittent    Aggravating Factors  wearing mask    Pain Relieving Factors taking off mask          Checked O2 sats after walking/testing in gym with mask on consistently, O2 sats 99%.    [facility] PT Assessment - 06/01/20 1027      Assessment   Medical Diagnosis Traumatic Brain Injury    Referring Provider (PT) Deatra Ina, PA-C; to be followed by Dr. 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 1    Has the patient had a decrease in activity level because of a fear of falling?  No    Is the patient reluctant to leave their home because of  a fear of falling?  No      Home Nurse, mental health Private residence    Living Arrangements Spouse/significant other    Available Help at Discharge Family    Type of Home House    Home Access Stairs to enter    Entrance Stairs-Number of Steps 1    Home Layout Two level;Able to live on main level with bedroom/bathroom    Home Equipment None    Additional [place], and he would like to return there in about 6 weeks.      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    Leisure Worked out with Tree surgeon 4x/wk, running, enjoyed sport fishing on his boat      Observation/Other Assessments   Focus on Therapeutic Outcomes (FOTO)  NA      ROM / Strength   AROM / PROM / Strength Strength      Strength   Overall Strength  Within functional limits for tasks performed    Strength Assessment Site --    Right/Left Hip --      Transfers   Transfers Sit to Stand;Stand to Sit    Sit to Stand 6: Modified independent (Device/Increase time);Without upper extremity assist;From chair/3-in-1    Five time sit to stand comments  8.57    Stand to Sit 6: Modified independent (Device/Increase time);Without upper extremity assist;To chair/3-in-1      Ambulation/Gait   Ambulation/Gait Yes    Ambulation/Gait Assistance 6: Modified independent (Device/Increase time)    Ambulation Distance (Feet) 200 Feet    Assistive device None    Gait Pattern Step-through pattern;Wide base of support    Ambulation Surface Level;Indoor    Gait velocity 7.19 sec =4.56 ft/sec      High Level Balance   High Level Balance Comments 0.97 sec RLE, 1.19 sec LLE.  Stood EO and EC on solid surface x 30 seconds, Stood EO and EC on foam surface x 30 seconds, with increased postural sway noted with EO on foam.      Functional Gait  Assessment   Gait assessed  Yes    Gait Level Surface Walks 20 ft in less than 7 sec but greater than 5.5 sec, uses assistive device, slower speed, mild gait deviations, or deviates 6-10 in outside of the 12 in walkway width.    Change in Gait Speed Able to change speed, demonstrates mild gait deviations, deviates 6-10 in outside of the 12 in walkway width, or no gait deviations, unable to achieve a major change in velocity, or uses a change in velocity, or uses an assistive device.    Gait with Horizontal Head Turns Performs head turns smoothly with slight change in gait velocity (eg, minor disruption to smooth gait path), deviates 6-10 in outside 12 in walkway width, or uses an assistive device.    Gait with Vertical Head Turns Performs task with slight change in gait velocity (eg, minor disruption to smooth gait path), deviates 6 - 10 in outside 12 in walkway width or uses assistive device    Gait and Pivot Turn Pivot turns  safely within 3 sec and stops quickly with no loss of balance.    Step Over Obstacle Is able to step over one shoe box (4.5 in total height) without changing gait speed. No  evidence of imbalance.    Gait with Narrow Base of Support Is able to ambulate for 10 steps heel to toe with no staggering.    Gait with Eyes Closed Walks 20 ft, uses assistive device, slower speed, mild gait deviations, deviates 6-10 in outside 12 in walkway width. Ambulates 20 ft in less than 9 sec but greater than 7 sec.    Ambulating Backwards Walks 20 ft, uses assistive device, slower speed, mild gait deviations, deviates 6-10 in outside 12 in walkway width.    Steps Alternating feet, must use rail.    Total Score 22    FGA comment: Scores <22/30 indicate increased fall risk                      Objective measurements completed on examination: See above findings.               PT Education - 06/02/20 1340    Education Details PT eval results, POC    Person(s) Educated Patient    Methods Explanation    Comprehension Verbalized understanding               PT Long Term Goals - 06/02/20 1350      PT LONG TERM GOAL #1   Title Pt will be independent with HEP for improved balance, transfers, and gait.  TARGET 06/30/2020    Time 4    Period Weeks    Status New      PT LONG TERM GOAL #2   Title Sensory ORganization Test to be assessed, with goal to be written as appropriate.    Time 4    Period Weeks    Status New      PT LONG TERM GOAL #3   Title Pt will improve SLS to at least 3 seconds for improved stability for stairs and obstacle negotiation.    Baseline 0.97 sec, 1.19 sec    Time 4    Period Weeks    Status New      PT LONG TERM GOAL #4   Title Pt will improve FGA score to at least 25/30 for decreased fall risk.    Baseline 22/30    Time 4    Period Weeks    Status New      PT LONG TERM GOAL #5   Title Pt will ambulate at least 1000 ft, indoor and outdoor surfaces,  independently no LOB, for improved return to independent community activities.    Time 4    Period Weeks    Status New                  Plan - 06/02/20 1342    Clinical Impression Statement Pt is a 65 y.o. male with history of CVA maintained on aspirin, Covid infection in March and then again in December 2021 hypertension hyperlipidemia.  Presented 05/04/2020 after being found down by his wife with altered mental status and aphasia.  Wife reported recent headache.  Cranial CT scan showed bilateral subdural hematomas without midline shift.  Pt was seen in CIR and discharged home 05/30/2020.  He presents to OPPT today with dx of TBI, with decreased static and dynamic balance, decreased balance with gait.  Pt describes dizziness as unsteadiness or lightheadedness, but vital signs including O2 sats and BP measures are WNL in sitting/standing positions.  He was independent, active, and enjoyed traveling prior to hospitalization.  He would benefit from skilled PT to address the above  stated deficits to decrease fall risk and improve overall return to independence.    Personal Factors and Comorbidities Comorbidity 3+    Comorbidities PMH-low back pain, HTN, CVA    Examination-Activity Limitations Locomotion Level;Transfers    Examination-Participation Restrictions Community Activity;Other   travel   Stability/Clinical Decision Making Evolving/Moderate complexity    Clinical Decision Making Moderate    Rehab Potential Good    PT Frequency 2x / week    PT Duration 4 weeks   plus eval   PT Treatment/Interventions ADLs/Self Care Home Management;Gait training;Stair training;Functional mobility training;Therapeutic activities;Patient/family education;Therapeutic exercise;Balance training;Neuromuscular re-education;Vestibular    PT Next Visit Plan Assess Sensory Organization test and write goal as appropriate.  Need to work on HEP for SLS, dynamic balance and complaint surfaces for vestibular input     Consulted and Agree with Plan of Care Patient           Patient will benefit from skilled therapeutic intervention in order to improve the following deficits and impairments:  Abnormal gait,Dizziness,Decreased balance,Decreased mobility  Visit Diagnosis: Other abnormalities of gait and mobility  Unsteadiness on feet     Problem List Patient Active Problem List   Diagnosis Date Noted  . PAF (paroxysmal atrial fibrillation) (HCC)   . Hyponatremia   . Slow transit constipation   . Essential hypertension   . TBI (traumatic brain injury) (HCC) 05/12/2020  . Contusion of face   . Subarachnoid hemorrhage following injury, with loss of consciousness (HCC)   . Agitation   . Hypokalemia   . Subdural hematoma (HCC) 05/04/2020  . Subarachnoid bleed (HCC) 05/04/2020  . Nonallopathic lesion of sacral region 05/06/2017  . Nonallopathic lesion of lumbar region 05/06/2017  . Nonallopathic lesion of thoracic region 05/06/2017  . 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 Acute encephalopathy 04/22/2012  . TIA (transient ischemic attack) 04/22/2012  . HTN (hypertension) 04/21/2012  . Delirium 04/21/2012  . Obesity 04/21/2012  . Hyperlipidemia 04/21/2012  . GERD (gastroesophageal reflux disease) 04/21/2012    Cannon Quinton W. 06/02/2020, 1:53 PM Gean MaidensMARRIOTT,Lisel Siegrist W., PT [place] [facility] [address] [place], [place], [address] Phone: [phone]   Fax:  [phone]  Name: Timothy Case MRN: [id number] Date of Birth: 01-29-1956

## 2020-06-02 NOTE — Patient Instructions (Addendum)
   Constant Therapy - App Free for 14 days Subscription afterwards $30/month  -  Use [employer] 15% off monthly subscription  Not for fun and games - for getting back to doing everything you were doing before the hospital

## 2020-06-02 NOTE — Therapy (Signed)
[facility] [address] [place], [place], [address] Phone: [phone]   Fax:  [phone]  Speech Language Pathology Evaluation  Patient Details  Name: Timothy Case MRN: [id number] Date of Birth: [birth year] Referring Provider (SLP): Roney Mans   Encounter Date: 06/02/2020   End of Session - 06/02/20 1321    Visit Number 1    Number of Visits 17    Date for SLP Re-Evaluation 08/31/20    SLP Start Time 1104    SLP Stop Time  1148    SLP Time Calculation (min) 44 min           Past Medical History:  Diagnosis Date  . Chronic lower back pain    "q am; cause I'm overweight" (04/21/2012)  . Essential familial hyperlipidemia   . GERD (gastroesophageal reflux disease)   . Hyperlipidemia   . Hypertension   . Shortness of breath    "lying down; sometimes" (04/21/2012)  . Stroke (HCC) 04/21/2012   "they are leaning towards mini stroke today; all S/S still gone now except little chest pain" (04/21/2012)    Past Surgical History:  Procedure Laterality Date  . KNEE ARTHROSCOPY  ~ 2008   "both knees; ~ 6 months apart" (04/21/2012)    Vitals:   06/02/20 1113  BP: 116/79     Subjective Assessment - 06/02/20 1119    Subjective "I feel light headed" SLP took pt's vitals (see vitals)    Patient is accompained by: Family member   Wife   Currently in Pain? Yes   "light-headed" or "a fog"             SLP Evaluation [facility] - 06/02/20 1121      SLP Visit Information   SLP Received On 06/02/20    Referring Provider (SLP) Roney Mans    Onset Date 05-04-20    Medical Diagnosis TBI      Subjective   Patient/Family Stated Goal "With what we were doing (in ST), I didn't see any connection to speech when I was in the hospital."      General Information   HPI Pt is a 65 y.o. male with a past medical history of transient global amnesia (TGA) in 2014, HTN, HLD, GERD, and SOB who was brought  into the ED for altered mental status after being found on the ground with facial trauma complaining of headache. Pt had a seizure in the ED which lasted ~2 minutes; pt was postictal and required intubation for airway protection. ETT 1/20-1/22 (self extubation). CT head small SAH bil sylvian fissure, SAH rt occipital lobe, rt frontoparietal SDH, bifrontal hemorrhagic contusions, contusion rt cerebellum, rt occipital bone fracture.      Prior Functional Status   Cognitive/Linguistic Baseline Within functional limits   pt and wife endorse pt had "some ADD" premorbidly (undiagnosed)   Type of Home House     Lives With Spouse    Available Support Family    Vocation Retired      IT consultant   Overall Cognitive Status Impaired/Different from baseline    Area of Impairment Attention;Safety/judgement;Awareness;Problem solving   see "clinical impression statement"   Awareness Comments Intellectual awareness - pt explained away sustained attention deficit/demonstration of impulsivity    Problem Solving Comments pt made SLP aware he was "not spending two hours on (his) phone"  when SLP told him 3 mintues earlier that app on phone would assist pt in improving his attention and other cognitive deficits  Behaviors Poor frustration tolerance;Impulsive      Auditory Comprehension   Overall Auditory Comprehension Appears within functional limits for tasks assessed      Verbal Expression   Overall Verbal Expression Appears within functional limits for tasks assessed      Motor Speech   Overall Motor Speech Appears within functional limits for tasks assessed                           SLP Education - 06/02/20 1318    Education Details Incr'd impulsivity/deficit in attention common afterTBI, pt's deficit in attention seems more significant than before TBI-as reported by wife Sheran Fava) Educated Patient;Spouse    Methods Explanation    Comprehension Need further instruction             SLP Short Term Goals - 06/02/20 1514      SLP SHORT TERM GOAL #1   Title pt will tell SLP 2 skill areas he needs to develop in 3 sessions    Time 4    Period Weeks    Status New      SLP SHORT TERM GOAL #2   Title pt will complete cogniive-linguistic tesing    Time 2    Period --   visits   Status New      SLP SHORT TERM GOAL #3   Title pt will demo awareness of errors in simple linguistic tasks with min A    Time 4    Status New            SLP Long Term Goals - 06/02/20 1516      SLP LONG TERM GOAL #1   Title pt will demo 100% success with simple linguisitc tasks (reading/written) with self correction    Time 8    Period Weeks   oy 17 total visits, for all LTGs   Status New      SLP LONG TERM GOAL #2   Title pt will perform medication management including filling med box two consecutive weeks, with with rare min A from family    Time 8    Period Weeks    Status New      SLP LONG TERM GOAL #3   Title selective attention will be demonstrated in a mod noisy environment for 8 minutes x3 sessions    Time 8    Period Weeks    Status New      SLP LONG TERM GOAL #4   Title pt will independently self-cue back to an 8 minute linguistic task, or will request change in environment, in order to compensate for attention during 3 sessions    Time 8    Period Weeks    Status New            Plan - 06/02/20 1332    Clinical Impression Statement Timothy "Timothy Case" presents today with cognitive deficits in at least problem solving, selective attention (internal distraction), and intellectual awareness. CIR cognitive deficits include attention, safety awareness, memory, and problem solving, Timothy Case tells SLP today thta he has "always had some ADD". Wife agreed and then added she has noted pt's attention span is shorter now than before TBI. Pt and wife deny any language problems currently. Pt was d/c'd on regular diet. Timothy Case demonstrated some reduced frustration tolerance today  during discussion of one subtest of Cognitive Linguistic Quick Test (CLQT) which targeted organization, emergent awareness, and attention. Last week on  CIR, ST notes state pt had difficulty with filling up a med box and pt explained away errors/difficulty. SLP skillfully observed some emerging skills with intellectual awareness as pt stated he wanted to improve attention to premorbid levels. But then demonstrted reduced problem solving/memory/insight when he stated he doesn't want to "sit in front of a phone for two hours" after SLP explained pt could engage in cognitive tasks via cognitive app Constant Therapy in order to improve his cognitive skills. Pt will need to see the benefit of skilled ST for cognition for ST to have positive impact on his cognitive skills. If pt's intellectual awareness does not improve over the first 2-4 weeks it may be best to put pt on hold for 2-3 weeks until intellectual awareness improves. SLP believes pt will benefit from skilled ST to improve cognitive linguistics so pt can regain skills closer to his PLOF.    Speech Therapy Frequency 2x / week    Duration 8 weeks   or 17 sessions   Treatment/Interventions Compensatory techniques;Cueing hierarchy;Cognitive reorganization;Patient/family education;Internal/external aids;SLP instruction and feedback;Functional tasks    Potential to Achieve Goals Fair    Potential Considerations Ability to learn/carryover information;Cooperation/participation level           Patient will benefit from skilled therapeutic intervention in order to improve the following deficits and impairments:   Cognitive communication deficit    Problem List Patient Active Problem List   Diagnosis Date Noted  . PAF (paroxysmal atrial fibrillation) (HCC)   . Hyponatremia   . Slow transit constipation   . Essential hypertension   . TBI (traumatic brain injury) (HCC) 05/12/2020  . Contusion of face   . Subarachnoid hemorrhage following injury, with  loss of consciousness (HCC)   . Agitation   . Hypokalemia   . Subdural hematoma (HCC) 05/04/2020  . Subarachnoid bleed (HCC) 05/04/2020  . Nonallopathic lesion of sacral region 05/06/2017  . Nonallopathic lesion of lumbar region 05/06/2017  . Nonallopathic lesion of thoracic region 05/06/2017  . 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 Acute encephalopathy 04/22/2012  . TIA (transient ischemic attack) 04/22/2012  . HTN (hypertension) 04/21/2012  . Delirium 04/21/2012  . Obesity 04/21/2012  . Hyperlipidemia 04/21/2012  . GERD (gastroesophageal reflux disease) 04/21/2012    [facility] ,MS, CCC-SLP  06/02/2020, 3:34 PM  [facility] [address] [place], [place], [address] Phone: [phone]   Fax:  [phone]  Name: Tex Conroy MRN: [id number] Date of Birth: 06-11-1955

## 2020-06-08 ENCOUNTER — Encounter: Payer: Medicare Other | Attending: Registered Nurse | Admitting: Registered Nurse

## 2020-06-08 ENCOUNTER — Other Ambulatory Visit: Payer: Self-pay

## 2020-06-08 VITALS — BP 121/87 | HR 65 | Temp 98.0°F | Ht 69.0 in | Wt 210.0 lb

## 2020-06-08 DIAGNOSIS — I10 Essential (primary) hypertension: Secondary | ICD-10-CM | POA: Insufficient documentation

## 2020-06-08 DIAGNOSIS — I480 Paroxysmal atrial fibrillation: Secondary | ICD-10-CM | POA: Insufficient documentation

## 2020-06-08 NOTE — Patient Instructions (Signed)
Call to Schedule Hospital follow up appointment   Dr. Maeola Harman  [address]  [place], [place] [address] [phone]

## 2020-06-08 NOTE — Progress Notes (Signed)
Subjective:    Patient ID: Timothy Case, male    DOB: [birth year], 65 y.o.   MRN: [id number]  HPI: Timothy Case is a 65 y.o. male who returns for hospital follow up appointment of his TBI, PAF and  Essential Hypertension. Timothy Case was brought to [facility] on 05/04/2020 after being found down by his wife. Neurosurgery was consulted.  CT Head WO Contrast: CT Cervical Spine: CT Maxillofacial WO Contrast:  IMPRESSION: 1. Bilateral small subdural hematomas without midline shift. Diffuse subarachnoid hemorrhage which is predominately in the sylvian fissures with sparing of the basilar cisterns. There are bifrontal hemorrhagic contusions. There is a small left cerebellar hemorrhagic contusion. Pattern most consistent with trauma rather than aneurysm rupture. 2. Negative for facial fracture 3. Negative for cervical spine fracture.  CT Angio:  IMPRESSION: Negative CTA head.  No vascular malformation  Intracranial hemorrhage described on prior CT head report.  Timothy Case was admitted to inpatient rehabilitation on 05/12/2020 and discharged home on 05/30/2020. He's going to outpatient rehabilitation at [facility]. He denies any pain at this time, he states he has headaches occasionally. He rates his pain 0.   Timothy Case asked if Timothy Case could visit his son in [place] for the weekend and attend a wedding and reception for a few hours. This was discussed with Dr Carlis Abbott, she agreed with the above.   Pain Inventory Average Pain 0 Pain Right Now 0 My pain is na  LOCATION OF PAIN  na  BOWEL Number of stools per week: 5-6 Oral laxative use na Type of laxative na Enema or suppository use No  History of colostomy No  Incontinent No   BLADDER Normal In and out cath, frequency na Able to self cath na Bladder incontinence No  Frequent urination No  Leakage with coughing No  Difficulty starting stream  No  Incomplete bladder emptying No    Mobility walk without assistance ability to climb steps?  yes do you drive?  no  Function retired  Neuro/Psych weakness dizziness loss of taste or smell  Prior Studies TC appt  Physicians involved in your care TC appt   No family history on file. Social History   Socioeconomic History  . Marital status: Married    Spouse name: Not on file  . Number of children: Not on file  . Years of education: Not on file  . Highest education level: Not on file  Occupational History  . Not on file  Tobacco Use  . Smoking status: Never Smoker  . Smokeless tobacco: Never Used  Substance and Sexual Activity  . Alcohol use: Yes    Alcohol/week: 6.0 standard drinks    Types: 6 Glasses of wine per week    Comment: 04/21/2012 "few glasses of wine a few days in a row; none in a few days; @ the most I'll have is 2 glass of wine/day; average ~ 6 glasses/wk"  . Drug use: No  . Sexual activity: Yes  Other Topics Concern  . Not on file  Social History Narrative  . Not on file   Social Determinants of Health   Financial Resource Strain: Not on file  Food Insecurity: Not on file  Transportation Needs: Not on file  Physical Activity: Not on file  Stress: Not on file  Social Connections: Not on file   Past Surgical History:  Procedure Laterality Date  . KNEE ARTHROSCOPY  ~ 2008   "both knees; ~ 6 months apart" (04/21/2012)  Past Medical History:  Diagnosis Date  . Chronic lower back pain    "q am; cause I'm overweight" (04/21/2012)  . Essential familial hyperlipidemia   . GERD (gastroesophageal reflux disease)   . Hyperlipidemia   . Hypertension   . Shortness of breath    "lying down; sometimes" (04/21/2012)  . Stroke (HCC) 04/21/2012   "they are leaning towards mini stroke today; all S/S still gone now except little chest pain" (04/21/2012)   BP 121/87   Pulse 65   Temp 98 F (36.7 C)   Ht 5\' 9"  (1.753 m)   Wt 210 lb (95.3 kg)   SpO2 98%    BMI 31.01 kg/m   Opioid Risk Score:   Fall Risk Score:  `1  Depression screen PHQ 2/9  Depression screen [facility] 2/9 06/08/2020 03/13/2016 03/11/2016  Decreased Interest 2 0 0  Down, Depressed, Hopeless 0 0 0  PHQ - 2 Score 2 0 0  Altered sleeping 3 - -  Tired, decreased energy 2 - -  Change in appetite 0 - -  Feeling bad or failure about yourself  0 - -  Trouble concentrating 1 - -  Moving slowly or fidgety/restless 0 - -  Suicidal thoughts 0 - -  PHQ-9 Score 8 - -  Difficult doing work/chores Not difficult at all - -     Review of Systems  All other systems reviewed and are negative.      Objective:   Physical Exam Vitals and nursing note reviewed.  Constitutional:      Appearance: Normal appearance.  Cardiovascular:     Rate and Rhythm: Normal rate and regular rhythm.     Pulses: Normal pulses.     Heart sounds: Normal heart sounds.  Pulmonary:     Effort: Pulmonary effort is normal.     Breath sounds: Normal breath sounds.  Musculoskeletal:     Cervical back: Normal range of motion and neck supple.     Comments: Normal Muscle Bulk and Muscle Testing Reveals:  Upper Extremities: Full ROM and Muscle Strength 5/5  Lower Extremities: Full ROM and Muscle Strength 5/5 Arises from Table with ease Narrow Based Gait   Skin:    General: Skin is warm and dry.  Neurological:     Mental Status: He is alert and oriented to person, place, and time.  Psychiatric:        Mood and Affect: Mood normal.        Behavior: Behavior normal.           Assessment & Plan:  1. TBI: Continue Outpatient Therapy with [facility].Instructed to call Neurosurgery for HFU appointment.   Continue to Monitor.  2. PAF: Continue current medication regimen. Cardiology Following.  3.Essential Hypertension: Continue current medication. PCP Following.   F/U with Dr 03/13/2016 in 4- 6 weeks

## 2020-06-12 ENCOUNTER — Encounter: Payer: Self-pay | Admitting: Registered Nurse

## 2020-06-13 ENCOUNTER — Ambulatory Visit: Payer: Medicare Other

## 2020-06-13 ENCOUNTER — Encounter: Payer: Self-pay | Admitting: Physical Therapy

## 2020-06-13 ENCOUNTER — Ambulatory Visit: Payer: Medicare Other | Attending: Physician Assistant | Admitting: Physical Therapy

## 2020-06-13 ENCOUNTER — Other Ambulatory Visit: Payer: Self-pay

## 2020-06-13 ENCOUNTER — Telehealth: Payer: Self-pay

## 2020-06-13 ENCOUNTER — Ambulatory Visit: Payer: Medicare Other | Admitting: Occupational Therapy

## 2020-06-13 ENCOUNTER — Encounter: Payer: Self-pay | Admitting: Occupational Therapy

## 2020-06-13 DIAGNOSIS — R41842 Visuospatial deficit: Secondary | ICD-10-CM | POA: Insufficient documentation

## 2020-06-13 DIAGNOSIS — R2689 Other abnormalities of gait and mobility: Secondary | ICD-10-CM | POA: Diagnosis present

## 2020-06-13 DIAGNOSIS — R41840 Attention and concentration deficit: Secondary | ICD-10-CM | POA: Diagnosis present

## 2020-06-13 DIAGNOSIS — R2681 Unsteadiness on feet: Secondary | ICD-10-CM

## 2020-06-13 DIAGNOSIS — R41841 Cognitive communication deficit: Secondary | ICD-10-CM | POA: Diagnosis present

## 2020-06-13 DIAGNOSIS — M6281 Muscle weakness (generalized): Secondary | ICD-10-CM | POA: Diagnosis present

## 2020-06-13 NOTE — Therapy (Signed)
[facility] [address] [place], [place], [address] Phone: [phone]   Fax:  [phone]  Occupational Therapy Treatment  Patient Details  Name: Timothy Case MRN: [id number] Date of Birth: 21-Aug-1955 Referring Provider (OT): Daleen Squibb (MD)   Encounter Date: 06/13/2020   OT End of Session - 06/13/20 1158    Visit Number 2    Number of Visits 9    Date for OT Re-Evaluation 06/29/20    Authorization Type Medicare A&B/[employer] Managed    Authorization Time Period Follow Medicare Guidelines  10th visit PN  KX Modifier    Authorization - Number of Visits 10    Progress Note Due on Visit 10    OT Start Time 1100    OT Stop Time 1150    OT Time Calculation (min) 50 min    Activity Tolerance Patient tolerated treatment well    Behavior During Therapy [facility] for tasks assessed/performed           Past Medical History:  Diagnosis Date  . Chronic lower back pain    "q am; cause I'm overweight" (04/21/2012)  . Essential familial hyperlipidemia   . GERD (gastroesophageal reflux disease)   . Hyperlipidemia   . Hypertension   . Shortness of breath    "lying down; sometimes" (04/21/2012)  . Stroke ([place]) 04/21/2012   "they are leaning towards mini stroke today; all S/S still gone now except little chest pain" (04/21/2012)    Past Surgical History:  Procedure Laterality Date  . KNEE ARTHROSCOPY  ~ 2008   "both knees; ~ 6 months apart" (04/21/2012)    There were no vitals filed for this visit.   Subjective Assessment - 06/13/20 1105    Subjective  I physically feel really good    Pertinent History hx of CVA, Covid x 2 (2021), HTN, HLD    Limitations no driving. falls.    Currently in Pain? No/denies    Pain Score 0-No pain                        OT Treatments/Exercises (OP) - 06/13/20 1155      ADLs   Cooking Patient able to give step by step instructions for complex chicken dish.  Patient has  been cooking at home.    Driving Patient given information on graduated drive program.  Instructed patient to seek medical clearance - will communicate with PCP    ADL Comments Reviewed all OT goals.  Patient reports change to BP medication and seems to correlate to reduced light headedness.  Patient has met OT goals and he and his wife are agreeable to discharge      Visual/Perceptual Exercises   Other Exercises Completed MVPT with 98% accuracy. Addressed eye hand coordination - no difficulty noted.  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                  OT Education - 06/13/20 1158    Education Details OT goals and discharge. Return to driving    Person(s) Educated Patient;Spouse    Methods Explanation    Comprehension Verbalized understanding            OT Short Term Goals - 06/01/20 1539      OT SHORT TERM GOAL #1   Title LTGs only.             OT Long Term Goals - 06/13/20 1202  OT LONG TERM GOAL #1   Title Pt will be independent with HEP for general strengthening BUE 06/29/2020    Status Achieved   Encouraged return to trainer     OT LONG TERM GOAL #2   Title Pt will perform physical and cognitive task simultaneously with 100% Accuracy    Time 4    Period Weeks    Status Achieved      OT LONG TERM GOAL #3   Title Pt will complete a mod level cognitive task while completing cooking with mod I and good safety awareness    Time 4    Period Weeks    Status Achieved      OT LONG TERM GOAL #4   Title Continue to assess vision and set goal PRN    Time 4    Period Weeks    Status Achieved   MVPT - 98% accuracy     OT LONG TERM GOAL #5   Title Pt and caregiver will verbalize understanding of return to driving recommendations    Time 4    Period Weeks    Status Achieved                 Plan - 06/13/20 1159    Clinical Impression Statement Patient has met his OT goals and is eager to return to prior level of functioning.   Encouraged him to return to working out with trainer, and gave instructions for graduated driving program to he and his wife.    OT Occupational Profile and History Problem Focused Assessment - Including review of records relating to presenting problem    Occupational performance deficits (Please refer to evaluation for details): ADL's;IADL's;Leisure    Body Structure / Function / Physical Skills IADL;Decreased knowledge of precautions;Decreased knowledge of use of DME;FMC;UE functional use;GMC;Endurance;Strength;Vision    Cognitive Skills Attention;Safety Awareness;Understand;Perception    Rehab Potential Good    Clinical Decision Making Limited treatment options, no task modification necessary    Comorbidities Affecting Occupational Performance: None    Modification or Assistance to Complete Evaluation  No modification of tasks or assist necessary to complete eval    OT Frequency 2x / week    OT Duration 4 weeks   or 8 visits + eval   OT Treatment/Interventions Self-care/ADL training;Moist Heat;DME and/or AE instruction;Manual Therapy;Cognitive remediation/compensation;Therapeutic activities;Functional Mobility Training;Energy conservation;Patient/family education;Visual/perceptual remediation/compensation;Therapeutic exercise    Plan discharge    Consulted and Agree with Plan of Care Patient;Family member/caregiver    Family Member Consulted spouse           Patient will benefit from skilled therapeutic intervention in order to improve the following deficits and impairments:   Body Structure / Function / Physical Skills: IADL,Decreased knowledge of precautions,Decreased knowledge of use of DME,FMC,UE functional use,GMC,Endurance,Strength,Vision Cognitive Skills: Attention,Safety Awareness,Understand,Perception     Visit Diagnosis: Attention and concentration deficit  Visuospatial deficit  Muscle weakness (generalized)  Unsteadiness on feet    Problem List Patient Active  Problem List   Diagnosis Date Noted  . PAF (paroxysmal atrial fibrillation) ([place])   . Hyponatremia   . Slow transit constipation   . Essential hypertension   . TBI (traumatic brain injury) ([place]) 05/12/2020  . Contusion of face   . Subarachnoid hemorrhage following injury, with loss of consciousness ([place])   . Agitation   . Hypokalemia   . Subdural hematoma ([place]) 05/04/2020  . Subarachnoid bleed ([place]) 05/04/2020  . Nonallopathic lesion of sacral region 05/06/2017  . Nonallopathic lesion of  lumbar region 05/06/2017  . Nonallopathic lesion of thoracic region 05/06/2017  . 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 Acute encephalopathy 04/22/2012  . TIA (transient ischemic attack) 04/22/2012  . HTN (hypertension) 04/21/2012  . Delirium 04/21/2012  . Obesity 04/21/2012  . Hyperlipidemia 04/21/2012  . GERD (gastroesophageal reflux disease) 04/21/2012  .OCCUPATIONAL THERAPY DISCHARGE SUMMARY  Visits from Start of Care: 2  Current functional level related to goals / functional outcomes: Has returned to cooking, and is eager to return to exercise and driving   Remaining deficits: NA  Education / Equipment: Graduated drive program Plan: Patient agrees to discharge.  Patient goals were met. Patient is being discharged due to meeting the stated rehab goals.  ?????      Mariah Milling , OTR/L 06/13/2020, 12:04 PM  [facility] [address] [place], [place], [address] Phone: [phone]   Fax:  [phone]  Name: Timothy Case MRN: [id number] Date of Birth: 1955/08/01

## 2020-06-13 NOTE — Telephone Encounter (Signed)
Let's get him into the office tomorrow then. Thx

## 2020-06-13 NOTE — Patient Instructions (Signed)
Graduated driving program:  Once your doctor clears you to drive.   Please start with driving in an empty parking lot with a licensed driver.   Practice parking, reversing, starting, stopping.  Practice use of all of the controls - wipers, lights, etc.   If this goes well - try driving to a familiar destination.  Choose a location that is not busy.   If that goes well - add driving to newer location.  Last places to add - driving on interstate and driving at night.

## 2020-06-13 NOTE — Therapy (Signed)
[facility] [address] [place], [place], [address] Phone: [phone]   Fax:  [phone]  Speech Language Pathology Treatment  Patient Details  Name: Timothy Case MRN: [id number] Date of Birth: Aug 22, 1955 Referring Provider (SLP): Roney Mans   Encounter Date: 06/13/2020   End of Session - 06/13/20 1655    Visit Number 2    Number of Visits 17    Date for SLP Re-Evaluation 08/31/20    SLP Start Time 1015    SLP Stop Time  1100    SLP Time Calculation (min) 45 min    Activity Tolerance Patient tolerated treatment well           Past Medical History:  Diagnosis Date  . Chronic lower back pain    "q am; cause I'm overweight" (04/21/2012)  . Essential familial hyperlipidemia   . GERD (gastroesophageal reflux disease)   . Hyperlipidemia   . Hypertension   . Shortness of breath    "lying down; sometimes" (04/21/2012)  . Stroke (HCC) 04/21/2012   "they are leaning towards mini stroke today; all S/S still gone now except little chest pain" (04/21/2012)    Past Surgical History:  Procedure Laterality Date  . KNEE ARTHROSCOPY  ~ 2008   "both knees; ~ 6 months apart" (04/21/2012)    There were no vitals filed for this visit.   Subjective Assessment - 06/13/20 1015    Subjective "I did that thing Baldo Ash gave me" re: Constant Therapy    Currently in Pain? No/denies    Pain Score 0-No pain                 ADULT SLP TREATMENT - 06/13/20 1018      General Information   Behavior/Cognition Alert      Treatment Provided   Treatment provided Cognitive-Linquistic      Cognitive-Linquistic Treatment   Treatment focused on Cognition;Patient/family/caregiver education    Skilled Treatment Pt reports use of Constant Therapy as instructed in evaluation. Pt stated "I like playing the games". CLQT completed this session, with mild emergent awareness of errors (recognizing errors & correcting  length of hands on clock for one to be longer and "I just did two equal" & "I copied yours twice" on design generation). All subtests were WNL; however, intermittent clarification of instructions provided (marking out example symbol every time). SLP provided analysis of CLQT results, in which intermittent poor frustration tolerance noted when SLP explained the importance of error awareness/attention to detail. Pt recently retired and expresses restless with new routine and limitations in place secondray to TBI (no driving, no working out). Pt questioned how often he would have to come to therapy, seemingly revealing decreased insight into deficits. Pt reports no issues at home with minimal responsibility for finances and medications.      Assessment / Recommendations / Plan   Plan Continue with current plan of care      Progression Toward Goals   Progression toward goals Progressing toward goals            SLP Education - 06/13/20 1052    Education Details cog results, error awareness    Person(s) Educated Patient    Methods Explanation;Demonstration;Handout    Comprehension Verbalized understanding;Returned demonstration;Need further instruction            SLP Short Term Goals - 06/13/20 1012      SLP SHORT TERM GOAL #1   Title pt will tell SLP 2 skill  areas he needs to develop in 3 sessions    Time 4    Period Weeks    Status On-going      SLP SHORT TERM GOAL #2   Title pt will complete cogniive-linguistic tesing    Time --    Period --   visits   Status Achieved      SLP SHORT TERM GOAL #3   Title pt will demo awareness of errors in simple linguistic tasks with min A    Time 4    Status On-going            SLP Long Term Goals - 06/13/20 1012      SLP LONG TERM GOAL #1   Title pt will demo 100% success with simple linguisitc tasks (reading/written) with self correction    Time 8    Period Weeks   oy 17 total visits, for all LTGs   Status On-going      SLP LONG  TERM GOAL #2   Title pt will perform medication management including filling med box two consecutive weeks, with with rare min A from family    Time 8    Period Weeks    Status On-going      SLP LONG TERM GOAL #3   Title selective attention will be demonstrated in a mod noisy environment for 8 minutes x3 sessions    Time 8    Period Weeks    Status On-going      SLP LONG TERM GOAL #4   Title pt will independently self-cue back to an 8 minute linguistic task, or will request change in environment, in order to compensate for attention during 3 sessions    Time 8    Period Weeks    Status On-going            Plan - 06/13/20 1221    Clinical Impression Statement [place] "[name]" presents today with cognitive deficits in at least problem solving, selective attention (internal distraction), and intellectual awareness. CLQT completed this session with all subtests WNL. However, pt required intermittent clarification/repetition of instructions. Improvements in emergent awareness demonstrated for corrections on CLQT subtests. Intellectual awareness questioned as pt reports no changes in functioning following TBI, other than rare dizziness. Pt will need to see the benefit of skilled ST for cognition for ST to have positive impact on his cognitive skills. If pt's intellectual awareness does not improve over the first 2-4 weeks it may be best to put pt on hold for 2-3 weeks until intellectual awareness improves. SLP believes pt will benefit from skilled ST to improve cognitive linguistics so pt can regain skills closer to his PLOF.    Speech Therapy Frequency 2x / week    Duration 8 weeks    Treatment/Interventions Compensatory techniques;Cueing hierarchy;Cognitive reorganization;Patient/family education;Internal/external aids;SLP instruction and feedback;Functional tasks    Potential to Achieve Goals Fair    Potential Considerations Ability to learn/carryover information;Cooperation/participation  level    Consulted and Agree with Plan of Care Patient           Patient will benefit from skilled therapeutic intervention in order to improve the following deficits and impairments:   Cognitive communication deficit    Problem List Patient Active Problem List   Diagnosis Date Noted  . PAF (paroxysmal atrial fibrillation) (HCC)   . Hyponatremia   . Slow transit constipation   . Essential hypertension   . TBI (traumatic brain injury) (HCC) 05/12/2020  . Contusion of face   .  Subarachnoid hemorrhage following injury, with loss of consciousness (HCC)   . Agitation   . Hypokalemia   . Subdural hematoma (HCC) 05/04/2020  . Subarachnoid bleed (HCC) 05/04/2020  . Nonallopathic lesion of sacral region 05/06/2017  . Nonallopathic lesion of lumbar region 05/06/2017  . Nonallopathic lesion of thoracic region 05/06/2017  . 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 Acute encephalopathy 04/22/2012  . TIA (transient ischemic attack) 04/22/2012  . HTN (hypertension) 04/21/2012  . Delirium 04/21/2012  . Obesity 04/21/2012  . Hyperlipidemia 04/21/2012  . GERD (gastroesophageal reflux disease) 04/21/2012    Janann Colonel, MA CCC-SLP 06/13/2020, 4:56 PM  [facility] [address] [place], [place], [address] Phone: [phone]   Fax:  [phone]   Name: Kota Ciancio MRN: [id number] Date of Birth: Dec 16, 1955

## 2020-06-13 NOTE — Therapy (Signed)
[facility] [address] [place], [place], [address] Phone: [phone]   Fax:  [phone]  Physical Therapy Treatment  Patient Details  Name: Timothy Case MRN: [id number] Date of Birth: [birth year] Referring Provider (PT): Deatra Ina, PA-C; to be followed by Dr. Riley Kill   Encounter Date: 06/13/2020   PT End of Session - 06/13/20 0934    Visit Number 2    Number of Visits 9    Date for PT Re-Evaluation 08/01/20    Authorization Type Medicare    PT Start Time 0932    PT Stop Time 1015    PT Time Calculation (min) 43 min    Activity Tolerance Patient tolerated treatment well    Behavior During Therapy Restless;WFL for tasks assessed/performed           Past Medical History:  Diagnosis Date  . Chronic lower back pain    "q am; cause I'm overweight" (04/21/2012)  . Essential familial hyperlipidemia   . GERD (gastroesophageal reflux disease)   . Hyperlipidemia   . Hypertension   . Shortness of breath    "lying down; sometimes" (04/21/2012)  . Stroke (HCC) 04/21/2012   "they are leaning towards mini stroke today; all S/S still gone now except little chest pain" (04/21/2012)    Past Surgical History:  Procedure Laterality Date  . KNEE ARTHROSCOPY  ~ 2008   "both knees; ~ 6 months apart" (04/21/2012)    There were no vitals filed for this visit.   Subjective Assessment - 06/13/20 0931    Subjective No new complaints. No falls. Wants to get back into working out with his trainer.    Patient Stated Goals PT goals to get back to normality.  [place]                [facility] Adult PT Treatment/Exercise - 06/13/20 0955      Transfers   Transfers Sit to Stand;Stand to Sit    Sit to Stand 6: Modified independent (Device/Increase time);Without upper extremity assist;From chair/3-in-1    Stand to Sit 6: Modified independent (Device/Increase time);Without upper extremity  assist;To chair/3-in-1      Ambulation/Gait   Ambulation/Gait Yes    Ambulation/Gait Assistance 6: Modified independent (Device/Increase time)    Ambulation Distance (Feet) --   around the gym with session   Assistive device None    Gait Pattern Step-through pattern;Wide base of support    Ambulation Surface Level;Indoor      Exercises   Exercises Other Exercises    Other Exercises  issued ex's to HEP for balance. Issued toe walking fwd/bwd, heel walking fwd/bwd, tandem gait fwd/bwd and grapevine working toward increased speed. (unable to locate Medbridge code as it did not save in note/education section).  cues needed on form/technique, min guard for safety.            Neuro re-ed: sensory organization test performed with following results: Conditions: 1: above, below, above 2: below, above, below 3: all above 4: all above 5: all above 6: all above Composite score: 80 (> norm of ~70) Sensory Analysis Som: above Vis: above Vest: above Pref: above Strategy analysis: no preference      COG alignment: mostly central with slight posterior bias     :        PT Education - 06/13/20 2158    Education Details results of SOT, initial HEP for balance/ankle strengthening.    Person(s)  Educated Patient;Spouse   spouse after OT session when she arrived to pick pt up   Methods Explanation;Demonstration;Verbal cues;Handout    Comprehension Verbalized understanding;Returned demonstration;Verbal cues required;Need further instruction               PT Long Term Goals - 06/02/20 1350      PT LONG TERM GOAL #1   Title Pt will be independent with HEP for improved balance, transfers, and gait.  TARGET 06/30/2020    Time 4    Period Weeks    Status New      PT LONG TERM GOAL #2   Title Sensory ORganization Test to be assessed, with goal to be written as appropriate.    Time 4    Period Weeks    Status New      PT LONG TERM GOAL #3   Title Pt will improve SLS to at least 3  seconds for improved stability for stairs and obstacle negotiation.    Baseline 0.97 sec, 1.19 sec    Time 4    Period Weeks    Status New      PT LONG TERM GOAL #4   Title Pt will improve FGA score to at least 25/30 for decreased fall risk.    Baseline 22/30    Time 4    Period Weeks    Status New      PT LONG TERM GOAL #5   Title Pt will ambulate at least 1000 ft, indoor and outdoor surfaces, independently no LOB, for improved return to independent community activities.    Time 4    Period Weeks    Status New                 Plan - 06/13/20 0934    Clinical Impression Statement Today's skilled session initially focused on performance of SOT to futher assess balance deficits. Pt scored 80 on composite score (70 is age related norm). Pt also scored above norms on all sensory analysis and did not demo any preference for ankle/hip strategies. Remainder of session focused on establishment of an HEP to address ankle strengthening and high level balance. Pt is asking about returnt to use of personal trainer. At this time pt appeas to be safe to resume use of hsi personal trainer. Deferred UE ex's to OT (pt to see OT after PT today). The pt is progressing toward goals and possibly could finish with PT at next session.    Personal Factors and Comorbidities Comorbidity 3+    Comorbidities PMH-low back pain, HTN, CVA    Examination-Activity Limitations Locomotion Level;Transfers    Examination-Participation Restrictions Community Activity;Other   travel   Stability/Clinical Decision Making Evolving/Moderate complexity    Rehab Potential Good    PT Frequency 2x / week    PT Duration 4 weeks   plus eval   PT Treatment/Interventions ADLs/Self Care Home Management;Gait training;Stair training;Functional mobility training;Therapeutic activities;Patient/family education;Therapeutic exercise;Balance training;Neuromuscular re-education;Vestibular    PT Next Visit Plan how did HEP go? Has he  resumed use of personal trainer? ? wrap up PT at next session (pt has shown great progress).    Consulted and Agree with Plan of Care Patient           Patient will benefit from skilled therapeutic intervention in order to improve the following deficits and impairments:  Abnormal gait,Dizziness,Decreased balance,Decreased mobility  Visit Diagnosis: Muscle weakness (generalized)  Other abnormalities of gait and mobility  Unsteadiness on feet  Problem List Patient Active Problem List   Diagnosis Date Noted  . PAF (paroxysmal atrial fibrillation) (HCC)   . Hyponatremia   . Slow transit constipation   . Essential hypertension   . TBI (traumatic brain injury) (HCC) 05/12/2020  . Contusion of face   . Subarachnoid hemorrhage following injury, with loss of consciousness (HCC)   . Agitation   . Hypokalemia   . Subdural hematoma (HCC) 05/04/2020  . Subarachnoid bleed (HCC) 05/04/2020  . Nonallopathic lesion of sacral region 05/06/2017  . Nonallopathic lesion of lumbar region 05/06/2017  . Nonallopathic lesion of thoracic region 05/06/2017  . 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 Acute encephalopathy 04/22/2012  . TIA (transient ischemic attack) 04/22/2012  . HTN (hypertension) 04/21/2012  . Delirium 04/21/2012  . Obesity 04/21/2012  . Hyperlipidemia 04/21/2012  . GERD (gastroesophageal reflux disease) 04/21/2012    Sallyanne Kuster, PTA, [facility] [address] [place], [place] [address] [phone] 06/13/20, 10:19 PM   Name: Timothy Case MRN: [id number] Date of Birth: April 23, 1955

## 2020-06-13 NOTE — Telephone Encounter (Signed)
Patient called and said he is being released from OT and other therapies- he wants to be released to drive.  Therapy advised him he needs doctor release.  He contacted PCP- they told him cannot as they are not treating.  He asked to be moved up to see you prior to April - you have a cancel for tomorrow if you need to review therapy notes (and possible email sent to you from them).

## 2020-06-14 ENCOUNTER — Other Ambulatory Visit: Payer: Self-pay

## 2020-06-14 ENCOUNTER — Encounter: Payer: Medicare Other | Attending: Physical Medicine & Rehabilitation | Admitting: Physical Medicine & Rehabilitation

## 2020-06-14 ENCOUNTER — Encounter: Payer: Self-pay | Admitting: Physical Medicine & Rehabilitation

## 2020-06-14 VITALS — BP 117/75 | HR 69 | Temp 98.0°F | Ht 69.0 in | Wt 216.2 lb

## 2020-06-14 DIAGNOSIS — S066X9S Traumatic subarachnoid hemorrhage with loss of consciousness of unspecified duration, sequela: Secondary | ICD-10-CM

## 2020-06-14 DIAGNOSIS — I480 Paroxysmal atrial fibrillation: Secondary | ICD-10-CM | POA: Diagnosis present

## 2020-06-14 MED ORDER — PROPRANOLOL HCL ER 120 MG PO CP24: 120.0000 mg | ORAL_CAPSULE | Freq: Every day | ORAL | 4 refills | Status: DC

## 2020-06-14 NOTE — Patient Instructions (Addendum)
RETURN TO DRIVING PLAN:  WITH THE SUPERVISION OF A LICENSED DRIVER, PLEASE DRIVE IN AN EMPTY PARKING LOT FOR AT LEAST 2-3 TRIALS TO TEST REACTION TIME, VISION, USE OF EQUIPMENT IN CAR, ETC.  IF SUCCESSFUL WITH THE PARKING LOT DRIVING, PROCEED TO SUPERVISED DRIVING TRIALS IN YOUR NEIGHBORHOOD STREETS AT LOW TRAFFIC TIMES TO TEST OBSERVATION TO TRAFFIC SIGNALS, REACTION TIME, ETC. PLEASE ATTEMPT AT LEAST 2-3 TRIALS IN YOUR NEIGHBORHOOD.  IF NEIGHBORHOOD DRIVING IS SUCCESSFUL, YOU MAY PROCEED TO DRIVING IN BUSIER AREAS IN YOUR COMMUNITY WITH SUPERVISION OF A LICENSED DRIVER. PLEASE ATTEMPT AT LEAST 2-3 TRIALS.  IF COMMUNITY DRIVING IS SUCCESSFUL, YOU MAY PROCEED TO DRIVING ALONE, DURING THE DAY TIME, IN NON-PEAK TRAFFIC TIMES. YOU SHOULD DRIVE NO FURTHER THAN 30 MINUTES IN ONE DIRECTION. PLEASE DO NOT DRIVE IF YOU FEEL FATIGUED OR UNDER THE INFLUENCE OF MEDICATION.    YOU MAY HAVE A GLASS OF WINE SOCIALLY IN ABOUT 2 MONTHS.

## 2020-06-14 NOTE — Progress Notes (Signed)
Subjective:    Patient ID: Timothy Case, male    DOB: January 03, 1956, 65 y.o.   MRN: [id number]  HPI  Patient is here in follow-up of his traumatic brain injury.  He has continued to progress remarkably from a motor and cognitive/behavioral standpoint.  He has been discharged from outpatient therapies.  He is wants to go back to driving again.  He has tried driving in the neighborhood on the couple attempts without any problems.  He denies any headaches.  Mood is been well controlled.  Blood pressure has run a little bit low and his primary stopped his amlodipine.  He sleeping well.  Denies any pain except that along the medial aspect of his right middle toe which he feels is almost like a "callus".  He plans on taking some trips this spring and asked if it was okay to take those trips and potentially fly an airplane if needed.  Denies any dizziness shortness of breath etc.  He has been doing some exercise training with therapy but plans on transitioning to exercise program at the gym.  Pain Inventory Average Pain 5 Pain Right Now 0 My pain is intermittent and sharp  LOCATION OF PAIN  Lower back, headache  BOWEL Number of stools per week: 10-14 Oral laxative use Yes  Type of laxative Miralax Enema or suppository use No  History of colostomy No  Incontinent No   BLADDER Normal In and out cath, frequency N/A Able to self cath No  Bladder incontinence No  Frequent urination No  Leakage with coughing No  Difficulty starting stream No  Incomplete bladder emptying No    Mobility how many minutes can you walk? No problem walking ability to climb steps?  yes do you drive?  no  Function retired  Neuro/Psych No problems in this area  Prior Studies Any changes since last visit?  no  Physicians involved in your care Any changes since last visit?  no   History reviewed. No pertinent family history. Social History   Socioeconomic History  . Marital status:  Married    Spouse name: Not on file  . Number of children: Not on file  . Years of education: Not on file  . Highest education level: Not on file  Occupational History  . Not on file  Tobacco Use  . Smoking status: Never Smoker  . Smokeless tobacco: Never Used  Vaping Use  . Vaping Use: Never used  Substance and Sexual Activity  . Alcohol use: Not Currently    Alcohol/week: 6.0 standard drinks    Types: 6 Glasses of wine per week    Comment: 04/21/2012 "few glasses of wine a few days in a row; none in a few days; @ the most I'll have is 2 glass of wine/day; average ~ 6 glasses/wk"  . Drug use: No  . Sexual activity: Yes  Other Topics Concern  . Not on file  Social History Narrative  . Not on file   Social Determinants of Health   Financial Resource Strain: Not on file  Food Insecurity: Not on file  Transportation Needs: Not on file  Physical Activity: Not on file  Stress: Not on file  Social Connections: Not on file   Past Surgical History:  Procedure Laterality Date  . KNEE ARTHROSCOPY  ~ 2008   "both knees; ~ 6 months apart" (04/21/2012)   Past Medical History:  Diagnosis Date  . Chronic lower back pain    "q am; cause I'm  overweight" (04/21/2012)  . Essential familial hyperlipidemia   . GERD (gastroesophageal reflux disease)   . Hyperlipidemia   . Hypertension   . Shortness of breath    "lying down; sometimes" (04/21/2012)  . Stroke (HCC) 04/21/2012   "they are leaning towards mini stroke today; all S/S still gone now except little chest pain" (04/21/2012)   BP 117/75   Pulse 69   Temp 98 F (36.7 C)   Ht 5\' 9"  (1.753 m)   Wt 216 lb 3.2 oz (98.1 kg)   SpO2 96%   BMI 31.93 kg/m   Opioid Risk Score:   Fall Risk Score:  `1  Depression screen PHQ 2/9  Depression screen [facility] 2/9 06/14/2020 06/08/2020 03/13/2016 03/11/2016  Decreased Interest 0 2 0 0  Down, Depressed, Hopeless 0 0 0 0  PHQ - 2 Score 0 2 0 0  Altered sleeping - 3 - -  Tired, decreased energy - 2 - -   Change in appetite - 0 - -  Feeling bad or failure about yourself  - 0 - -  Trouble concentrating - 1 - -  Moving slowly or fidgety/restless - 0 - -  Suicidal thoughts - 0 - -  PHQ-9 Score - 8 - -  Difficult doing work/chores - Not difficult at all - -   Review of Systems  Musculoskeletal:       Pain in 4th toe on left foot  All other systems reviewed and are negative.      Objective:   Physical Exam  Gen: no distress, normal appearing HEENT: oral mucosa pink and moist, NCAT Cardio: Reg rate Chest: normal effort, normal rate of breathing Abd: soft, non-distended Ext: no edema Psych: pleasant, normal affect Skin: intact Neuro: Alert and oriented x 3. Normal insight and awareness. Intact Memory. Normal language and speech. Cranial nerve exam unremarkable. Motor 5/5. Functional balance and sensory function. Tandem and standard gait normal.  Musculoskeletal: Full ROM, No pain with AROM or PROM in the neck, trunk, or extremities. Posture appropriate       Assessment & Plan:  1.  TBI/SDH/nondisplaced right occipital bone fracture secondary to unwitnessed fall 05/04/2020             has made remarkable progress!  2. Mood/sleep: Melatonin 5 mg nightly as needed.               -propranolol   -mood near baseline 3  Seizure prophylaxis.  not indicated 4.  Atrial fibrillation with RVR.  continue propanolol 5.  Hypertension.                Continue propranolol in place of coreg--              lisinopril  6.  History of alcohol use.  may resume having a glass of wine in 2 mos    Fifteen minutes of face to face patient care time were spent during this visit. All questions were encouraged and answered.  Follow up with me in prn  .

## 2020-06-15 ENCOUNTER — Ambulatory Visit: Payer: Medicare Other

## 2020-06-15 ENCOUNTER — Ambulatory Visit: Payer: Medicare Other | Admitting: Physical Therapy

## 2020-06-15 ENCOUNTER — Encounter: Payer: Self-pay | Admitting: Physical Therapy

## 2020-06-15 DIAGNOSIS — M6281 Muscle weakness (generalized): Secondary | ICD-10-CM | POA: Diagnosis not present

## 2020-06-15 DIAGNOSIS — R2681 Unsteadiness on feet: Secondary | ICD-10-CM

## 2020-06-15 DIAGNOSIS — R41841 Cognitive communication deficit: Secondary | ICD-10-CM

## 2020-06-15 DIAGNOSIS — R2689 Other abnormalities of gait and mobility: Secondary | ICD-10-CM

## 2020-06-15 NOTE — Therapy (Signed)
[facility] [address] [place], [place], [address] Phone: [phone]   Fax:  [phone]  Speech Language Pathology Treatment-Discharge Summary  Patient Details  Name: Timothy Case MRN: [id number] Date of Birth: [birth year] Referring Provider (SLP): Lauraine Rinne PA-C   Encounter Date: 06/15/2020   End of Session - 06/15/20 0953    SLP Start Time 0930    SLP Stop Time  0953    SLP Time Calculation (min) 23 min           Past Medical History:  Diagnosis Date  . Chronic lower back pain    "q am; cause I'm overweight" (04/21/2012)  . Essential familial hyperlipidemia   . GERD (gastroesophageal reflux disease)   . Hyperlipidemia   . Hypertension   . Shortness of breath    "lying down; sometimes" (04/21/2012)  . Stroke ([place]) 04/21/2012   "they are leaning towards mini stroke today; all S/S still gone now except little chest pain" (04/21/2012)    Past Surgical History:  Procedure Laterality Date  . KNEE ARTHROSCOPY  ~ 2008   "both knees; ~ 6 months apart" (04/21/2012)    There were no vitals filed for this visit.   Subjective Assessment - 06/15/20 0933    Subjective "Im doing great. Dr Tessa Lerner cleared me and cancelled all my appointments"    Currently in Pain? No/denies    Pain Score 0-No pain                 ADULT SLP TREATMENT - 06/15/20 0934      General Information   Behavior/Cognition Alert      Treatment Provided   Treatment provided Cognitive-Linquistic      Cognitive-Linquistic Treatment   Treatment focused on Cognition;Patient/family/caregiver education    Skilled Treatment Pt reports return to baseline for cognitive linguistic skills. No reported errors within home. Pt has been discharged from PT/OT at this time. Neuro MD cleared patient for return to driving. SLP emphaized for pt to continue to use Constant Therapy for cognitive engagement. Pt verbalized understanding. Pt  denied further questions at this time.      Assessment / Recommendations / Plan   Plan Discharge SLP treatment due to (comment)   pt request     Progression Toward Goals   Progression toward goals Goals partially met, education completed, patient discharged from [facility] - 06/15/20 0944    Education Details continue Constant Therapy, review of driving instructions    Person(s) Educated Patient    Methods Explanation;Demonstration    Comprehension Verbalized understanding;Returned demonstration            SLP Short Term Goals - 06/15/20 0940      SLP SHORT TERM GOAL #1   Title pt will tell SLP 2 skill areas he needs to develop in 3 sessions    Status Partially Met      SLP SHORT TERM GOAL #2   Title pt will complete cogniive-linguistic tesing    Period --   visits   Status Achieved      SLP SHORT TERM GOAL #3   Title pt will demo awareness of errors in simple linguistic tasks with min A    Status Partially Met            SLP Long Term Goals - 06/15/20 0941      SLP LONG TERM GOAL #1   Title pt  will demo 100% success with simple linguisitc tasks (reading/written) with self correction    Period --   oy 17 total visits, for all LTGs   Status Partially Met      SLP LONG TERM GOAL #2   Title pt will perform medication management including filling med box two consecutive weeks, with with rare min A from family    Status Unable to assess      SLP LONG TERM GOAL #3   Title selective attention will be demonstrated in a mod noisy environment for 8 minutes x3 sessions    Status Partially Met      SLP LONG TERM GOAL #4   Title pt will independently self-cue back to an 8 minute linguistic task, or will request change in environment, in order to compensate for attention during 3 sessions    Status Partially Met            Plan - 06/15/20 0937    Clinical Impression Statement Cadarius "Mortimer Fries" presents today with request for ST discharge. Pt reports  he has returned to baseline prior to TBI. No concerns reported at this time. Pt reports that neuro MD has released him from "follow up appointments." SLP recommends pt continue with Constant Therapy.    Potential to Achieve Goals Fair    Consulted and Agree with Plan of Care Patient           SPEECH THERAPY DISCHARGE SUMMARY  Visits from Start of Care: 3  Current functional level related to goals / functional outcomes: Pt reports return to baseline and requested discharge from skilled ST with no areas to improve on reported.   Remaining deficits: Premorbid attention deficits reported    Education / Equipment: SLP educated patient on increasing awareness to correct errors and notice cognitive linguistic changes from baseline.  Plan: Patient agrees to discharge.  Patient goals were partially met. Patient is being discharged due to being pleased with the current functional level.  ?????        Patient will benefit from skilled therapeutic intervention in order to improve the following deficits and impairments:   Cognitive communication deficit    Problem List Patient Active Problem List   Diagnosis Date Noted  . PAF (paroxysmal atrial fibrillation) ([place])   . Hyponatremia   . Slow transit constipation   . Essential hypertension   . TBI (traumatic brain injury) ([place]) 05/12/2020  . Contusion of face   . Subarachnoid hemorrhage following injury, with loss of consciousness ([place])   . Agitation   . Hypokalemia   . Subdural hematoma ([place]) 05/04/2020  . Subarachnoid bleed ([place]) 05/04/2020  . Nonallopathic lesion of sacral region 05/06/2017  . Nonallopathic lesion of lumbar region 05/06/2017  . Nonallopathic lesion of thoracic region 05/06/2017  . 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 Acute encephalopathy 04/22/2012  . TIA (transient ischemic attack) 04/22/2012  . HTN (hypertension) 04/21/2012  . Delirium 04/21/2012  . Obesity 04/21/2012  . Hyperlipidemia  04/21/2012  . GERD (gastroesophageal reflux disease) 04/21/2012    Alinda Deem, MA CCC-SLP 06/15/2020, 9:53 AM  [facility] [address] [place], [place], [address] Phone: [phone]   Fax:  [phone]   Name: Audiel Scheiber MRN: [id number] Date of Birth: 07/04/1955

## 2020-06-15 NOTE — Therapy (Addendum)
[facility] [address] [place], [place], [address] Phone: [phone]   Fax:  [phone]  Physical Therapy Treatment/Discharge Summary  Patient Details  Name: Timothy Case MRN: [id number] Date of Birth: 04-Sep-1955 Referring Provider (PT): Marlowe Shores, PA-C; to be followed by Dr. Naaman Plummer    PHYSICAL THERAPY DISCHARGE SUMMARY  Visits from Start of Care: 3  Current functional level related to goals / functional outcomes:  PT Long Term Goals - 06/15/20 0851      PT LONG TERM GOAL #1   Title Pt will be independent with HEP for improved balance, transfers, and gait.  TARGET 06/30/2020    Baseline 06/15/20: has HEP and plans to resume with personal traininer on Monday    Status Achieved      PT LONG TERM GOAL #2   Title Sensory ORganization Test to be assessed, with goal to be written as appropriate.    Baseline SOT performed with no need for goal, pt above norm with testing.    Status Deferred      PT LONG TERM GOAL #3   Title Pt will improve SLS to at least 3 seconds for improved stability for stairs and obstacle negotiation.    Baseline 06/15/20: >/=5 sec's both legs    Time --    Period --    Status Achieved      PT LONG TERM GOAL #4   Title Pt will improve FGA score to at least 25/30 for decreased fall risk.    Baseline 06/15/20: 29/30 scored today    Time --    Period --    Status Achieved      PT LONG TERM GOAL #5   Title Pt will ambulate at least 1000 ft, indoor and outdoor surfaces, independently no LOB, for improved return to independent community activities.    Baseline 06/15/20: met today    Time --    Period --    Status Achieved          Pt with overall functional improvement in functional mobility; Sensory Organization Test performed last visit, no sensory/balance deficits specifically needing to be addressed.   Remaining deficits: None at this time   Education /  Equipment: Educated in [employer] (pt has this already and is planning to return to trainer). Plan: Patient agrees to discharge.  Patient goals were met. Patient is being discharged due to meeting the stated rehab goals.  ?????        Mady Haagensen, PT 06/15/20 1:42 PM Phone: [phone] Fax: [phone]    Encounter Date: 06/15/2020   PT End of Session - 06/15/20 0850    Visit Number 3    Number of Visits 9    Date for PT Re-Evaluation 08/01/20    Authorization Type Medicare    PT Start Time 0848    PT Stop Time 0908   discharge visit, not all time was needed   PT Time Calculation (min) 20 min    Activity Tolerance Patient tolerated treatment well    Behavior During Therapy [facility] for tasks assessed/performed           Past Medical History:  Diagnosis Date  . Chronic lower back pain    "q am; cause I'm overweight" (04/21/2012)  . Essential familial hyperlipidemia   . GERD (gastroesophageal reflux disease)   . Hyperlipidemia   . Hypertension   . Shortness of breath    "lying down; sometimes" (04/21/2012)  . Stroke ([place]) 04/21/2012   "  they are leaning towards mini stroke today; all S/S still gone now except little chest pain" (04/21/2012)    Past Surgical History:  Procedure Laterality Date  . KNEE ARTHROSCOPY  ~ 2008   "both knees; ~ 6 months apart" (04/21/2012)    There were no vitals filed for this visit.   Subjective Assessment - 06/15/20 0849    Subjective Saw Dr. Naaman Plummer who has cleared him from everything, okayed him to return to driving. Starts back with the trainer on Monday.    Patient Stated Goals PT goals to get back to normality.  To get back to boats[place]    Currently in Pain? No/denies    Pain Score 0-No pain              [facility] PT Assessment - 06/15/20 0851      Functional Gait  Assessment   Gait assessed  Yes    Gait Level Surface Walks 20 ft in less than 5.5 sec, no assistive devices, good speed, no evidence for imbalance, normal gait  pattern, deviates no more than 6 in outside of the 12 in walkway width.   4.75   Change in Gait Speed Able to smoothly change walking speed without loss of balance or gait deviation. Deviate no more than 6 in outside of the 12 in walkway width.    Gait with Horizontal Head Turns Performs head turns smoothly with no change in gait. Deviates no more than 6 in outside 12 in walkway width    Gait with Vertical Head Turns Performs head turns with no change in gait. Deviates no more than 6 in outside 12 in walkway width.    Gait and Pivot Turn Pivot turns safely within 3 sec and stops quickly with no loss of balance.    Step Over Obstacle Is able to step over 2 stacked shoe boxes taped together (9 in total height) without changing gait speed. No evidence of imbalance.    Gait with Narrow Base of Support Is able to ambulate for 10 steps heel to toe with no staggering.    Gait with Eyes Closed Walks 20 ft, no assistive devices, good speed, no evidence of imbalance, normal gait pattern, deviates no more than 6 in outside 12 in walkway width. Ambulates 20 ft in less than 7 sec.   6.13 sec's   Ambulating Backwards Walks 20 ft, no assistive devices, good speed, no evidence for imbalance, normal gait    Steps Alternating feet, must use rail.   intermittent touch to rail   Total Score 29                [facility] Adult PT Treatment/Exercise - 06/15/20 0851      Transfers   Transfers Sit to Stand;Stand to Sit    Sit to Stand 6: Modified independent (Device/Increase time);Without upper extremity assist;From chair/3-in-1    Stand to Sit 6: Modified independent (Device/Increase time);Without upper extremity assist;To chair/3-in-1      Ambulation/Gait   Ambulation/Gait Yes    Ambulation/Gait Assistance 6: Modified independent (Device/Increase time)    Ambulation Distance (Feet) 1000 Feet   x1, plus around gym with session   Assistive device None    Gait Pattern Step-through pattern;Wide base of support     Ambulation Surface Level;Indoor      Neuro Re-ed    Neuro Re-ed Details  single leg stance- 8-10 sec's on each leg with no UE support.  PT Long Term Goals - 06/15/20 0851      PT LONG TERM GOAL #1   Title Pt will be independent with HEP for improved balance, transfers, and gait.  TARGET 06/30/2020    Baseline 06/15/20: has HEP and plans to resume with personal traininer on Monday    Status Achieved      PT LONG TERM GOAL #2   Title Sensory ORganization Test to be assessed, with goal to be written as appropriate.    Baseline SOT performed with no need for goal, pt above norm with testing.    Status Deferred      PT LONG TERM GOAL #3   Title Pt will improve SLS to at least 3 seconds for improved stability for stairs and obstacle negotiation.    Baseline 06/15/20: >/=5 sec's both legs    Time --    Period --    Status Achieved      PT LONG TERM GOAL #4   Title Pt will improve FGA score to at least 25/30 for decreased fall risk.    Baseline 06/15/20: 29/30 scored today    Time --    Period --    Status Achieved      PT LONG TERM GOAL #5   Title Pt will ambulate at least 1000 ft, indoor and outdoor surfaces, independently no LOB, for improved return to independent community activities.    Baseline 06/15/20: met today    Time --    Period --    Status Achieved                 Plan - 06/15/20 0850    Clinical Impression Statement Pt met all LTGs this session. Plans to resume with personal trainer on Monday. Has also been cleared by Dr. Eda Keys. Plan to discharge today. Primary PT in agreement with this plan.    Personal Factors and Comorbidities Comorbidity 3+    Comorbidities PMH-low back pain, HTN, CVA    Examination-Activity Limitations Locomotion Level;Transfers    Examination-Participation Restrictions Community Activity;Other   travel   Stability/Clinical Decision Making Evolving/Moderate complexity    Rehab Potential Good    PT Frequency 2x / week    PT  Duration 4 weeks   plus eval   PT Treatment/Interventions ADLs/Self Care Home Management;Gait training;Stair training;Functional mobility training;Therapeutic activities;Patient/family education;Therapeutic exercise;Balance training;Neuromuscular re-education;Vestibular    PT Next Visit Plan discharge with goals met    Consulted and Agree with Plan of Care Patient           Patient will benefit from skilled therapeutic intervention in order to improve the following deficits and impairments:  Abnormal gait,Dizziness,Decreased balance,Decreased mobility  Visit Diagnosis: Muscle weakness (generalized)  Unsteadiness on feet  Other abnormalities of gait and mobility     Problem List Patient Active Problem List   Diagnosis Date Noted  . PAF (paroxysmal atrial fibrillation) ([place])   . Hyponatremia   . Slow transit constipation   . Essential hypertension   . TBI (traumatic brain injury) ([place]) 05/12/2020  . Contusion of face   . Subarachnoid hemorrhage following injury, with loss of consciousness ([place])   . Agitation   . Hypokalemia   . Subdural hematoma ([place]) 05/04/2020  . Subarachnoid bleed ([place]) 05/04/2020  . Nonallopathic lesion of sacral region 05/06/2017  . Nonallopathic lesion of lumbar region 05/06/2017  . Nonallopathic lesion of thoracic region 05/06/2017  . 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 Acute encephalopathy 04/22/2012  . TIA (transient ischemic  attack) 04/22/2012  . HTN (hypertension) 04/21/2012  . Delirium 04/21/2012  . Obesity 04/21/2012  . Hyperlipidemia 04/21/2012  . GERD (gastroesophageal reflux disease) 04/21/2012    Willow Ora, PTA, [facility] Outpatient Neuro [facility] [address] [place], [place] [address] [phone] 06/15/20, 11:49 AM   Name: Travious Vanover MRN: [id number] Date of Birth: 02/20/1956

## 2020-06-16 ENCOUNTER — Ambulatory Visit: Payer: Medicare Other | Admitting: Physical Therapy

## 2020-06-16 ENCOUNTER — Ambulatory Visit: Payer: Medicare Other | Admitting: Occupational Therapy

## 2020-06-16 ENCOUNTER — Ambulatory Visit: Payer: Medicare Other

## 2020-06-20 ENCOUNTER — Ambulatory Visit: Payer: Medicare Other | Admitting: Occupational Therapy

## 2020-06-20 ENCOUNTER — Ambulatory Visit: Payer: Medicare Other | Admitting: Physical Therapy

## 2020-06-20 ENCOUNTER — Ambulatory Visit: Payer: Medicare Other

## 2020-06-23 ENCOUNTER — Ambulatory Visit: Payer: Medicare Other | Admitting: Physical Therapy

## 2020-06-23 ENCOUNTER — Ambulatory Visit: Payer: Medicare Other | Admitting: Occupational Therapy

## 2020-06-23 ENCOUNTER — Ambulatory Visit: Payer: Medicare Other

## 2020-06-27 ENCOUNTER — Ambulatory Visit: Payer: Medicare Other | Admitting: Occupational Therapy

## 2020-06-27 ENCOUNTER — Ambulatory Visit: Payer: Medicare Other

## 2020-06-27 ENCOUNTER — Ambulatory Visit: Payer: Medicare Other | Admitting: Physical Therapy

## 2020-06-30 ENCOUNTER — Encounter: Payer: Medicare Other | Admitting: Occupational Therapy

## 2020-06-30 ENCOUNTER — Ambulatory Visit: Payer: Medicare Other | Admitting: Physical Therapy

## 2020-07-05 ENCOUNTER — Encounter: Payer: Medicare Other | Admitting: Occupational Therapy

## 2020-07-05 ENCOUNTER — Ambulatory Visit: Payer: Medicare Other | Admitting: Physical Therapy

## 2020-07-07 ENCOUNTER — Ambulatory Visit: Payer: Medicare Other | Admitting: Physical Therapy

## 2020-07-07 ENCOUNTER — Encounter: Payer: Medicare Other | Admitting: Occupational Therapy

## 2020-07-11 ENCOUNTER — Ambulatory Visit: Payer: Medicare Other | Admitting: Psychology

## 2020-08-02 ENCOUNTER — Ambulatory Visit: Payer: Medicare Other | Admitting: Physical Medicine & Rehabilitation

## 2020-10-13 ENCOUNTER — Encounter: Payer: Self-pay | Admitting: Psychology

## 2020-11-02 ENCOUNTER — Encounter: Payer: Medicare Other | Attending: Psychology | Admitting: Psychology

## 2020-11-02 ENCOUNTER — Other Ambulatory Visit: Payer: Self-pay

## 2020-11-02 DIAGNOSIS — S069X2S Unspecified intracranial injury with loss of consciousness of 31 minutes to 59 minutes, sequela: Secondary | ICD-10-CM | POA: Insufficient documentation

## 2020-11-02 DIAGNOSIS — I480 Paroxysmal atrial fibrillation: Secondary | ICD-10-CM

## 2020-11-02 DIAGNOSIS — S066X9S Traumatic subarachnoid hemorrhage with loss of consciousness of unspecified duration, sequela: Secondary | ICD-10-CM

## 2020-11-27 NOTE — Progress Notes (Signed)
Neuropsychological Consultation   Patient:   Timothy Case [facility]   DOB:   12/17/1955  MR Number:  [id number]  Location:  [facility] FOR PAIN AND [facility] MEDICINE [facility] PHYSICAL MEDICINE AND REHABILITATION [address] [place], [address] [id number] [facility] [place] [address] Dept: [phone]           Date of Service:   11/02/2020  Start Time:   3 PM 5 PM End Time:     Today's visit was an in person visit was conducted in my outpatient clinic office.  The patient, his wife and myself were present for this visit.  1 hour and 15 minutes were spent in face-to-face clinical interview  Provider/Observer:  Arley Phenix, Psy.D.       Clinical Neuropsychologist       Billing Code/Service: 96116/96121  Chief Complaint:    Timothy Case is a 65 year old right-handed male with a prior history of CVA maintained on aspirin, COVID infection in March and then again in December 2021, hypertension, hyperlipidemia, history of prior significant concussive event and history of alcohol use.  Patient reports a prior concussive event 2015 where he fell off a ladder face first breaking his nose and causing a loss of consciousness and dislocation of his left shoulder.  Patient also had a TIA and 2014 clearing up the next day.  Patient presented to the emergency department on 05/04/2020 after being found down by his wife with altered mental status and aphasia.  Wife reported patient having recent headaches.  Specific events around a likely fall with head trauma are unclear as the patient was found in the house with a broken back door but suspicion that he had fallen at the front door made his way around to the back door.  Patient with retrograde and anterograde amnesia around the events of his fall.  Cranial CT scan showed bilateral subdural hematomas without midline shift.  Diffuse subarachnoid hemorrhage predominantly in the sylvian fissures with sparing of the basilar  cisterns noted.  There was bifrontal hemorrhagic contusions noted.  CT scans later showed blooming of hemorrhagic contusions in the bilateral inferior frontal lobes and right cerebellum.  Subsequent imaging measured his right frontal parietal subdural hematoma as much is 7 mm in thickness.  There was also findings of nondisplaced right occipital bone fracture.  Neurosurgery advised conservative care.  Patient had extended periods of agitation initially requiring sedation.  Agitation, confusion and impulsivity with balance deficits were prominent throughout his inpatient hospitalization stay and I saw the patient while he was on the acute comprehensive inpatient rehabilitation program during his hospitalization.  Reason for Service:  Timothy Case is a 65 year old right-handed male with a prior history of CVA maintained on aspirin, COVID infection in March and then again in December 2021, hypertension, hyperlipidemia, history of prior significant concussive event and history of alcohol use.  Patient reports a prior concussive event 2015 where he fell off a ladder face first breaking his nose and causing a loss of consciousness and dislocation of his left shoulder.  Patient also had a TIA and 2014 clearing up the next day.  Patient presented to the emergency department on 05/04/2020 after being found down by his wife with altered mental status and aphasia.  Wife reported patient having recent headaches.  Specific events around a likely fall with head trauma are unclear as the patient was found in the house with a broken back door but suspicion that he had fallen at the front door made  his way around to the back door.  Patient with retrograde and anterograde amnesia around the events of his fall.  Cranial CT scan showed bilateral subdural hematomas without midline shift.  Diffuse subarachnoid hemorrhage predominantly in the sylvian fissures with sparing of the basilar cisterns noted.  There was  bifrontal hemorrhagic contusions noted.  CT scans later showed blooming of hemorrhagic contusions in the bilateral inferior frontal lobes and right cerebellum.  Subsequent imaging measured his right frontal parietal subdural hematoma as much is 7 mm in thickness.  There was also findings of nondisplaced right occipital bone fracture.  Neurosurgery advised conservative care.  Patient had extended periods of agitation initially requiring sedation.  Agitation, confusion and impulsivity with balance deficits were prominent throughout his inpatient hospitalization stay and I saw the patient while he was on the acute comprehensive inpatient rehabilitation program during his hospitalization.  Today, the patient describes the incident that at times has been described as being in 2014 or 15 but the incident where he fell off the ladder apparently occurred in 2009.  He was hospitalized for 4 days with broken ribs, and other orthopedic injuries including shoulder injury.  The patient reports that he continues with some vertigo and balance issues and continues to be rather impulsive.  Patient today reports that he continues to have no recollection of his fall or injuries.  He reports that his first recall after the accident was being in the hospital after multiple days.  Patient reports that his vertigo continues to be problematic particularly when he is standing up or bending over.  Patient reports that this symptom has diminished in frequency and duration from where it was 3 months prior.  The patient reports that it went from multiple times a day lasting for 5 seconds or more but now happening once every couple of days.  The patient insists that other than that he is doing much better.  However, during the clinical interview the patient's wife was also present and reports that he continues to have ongoing issues with impulsivity and being overstimulated and is always more on edge and easily overwhelmed.  His wife  reports that the patient was having some difficulties even before this happened and coping with retirement and significant changes in her life.  The patient reports that his ongoing difficulties include dizziness, getting overwhelmed easily, problems with concentration issues and not as alert as he used to be.  He is described as sleeping more than he used to and becomes very tired around 8 or 9 PM.  Appetite is described as fine.  He describes some issues with memory and recall.  He describes some changes in his sense of smell/taste and he feels like both are not as acute as prior.  Behavioral Observation: Nelva BushRoberto Domingo Pizzuto  presents as a 65 y.o.-year-old Right handed Male who appeared his stated age. his dress was Appropriate and he was Well Groomed and his manners were Appropriate to the situation.  his participation was indicative of Appropriate behaviors.  There were not physical disabilities noted.  he displayed an appropriate level of cooperation and motivation.     Interactions:    Active Appropriate and Redirectable  Attention:   abnormal and attention span appeared shorter than expected for age  Memory:   abnormal; remote memory intact, recent memory impaired  Visuo-spatial:  not examined  Speech (Volume):  normal  Speech:   normal; normal  Thought Process:  Coherent and Tangential  Though Content:  WNL; not suicidal  and not homicidal  Orientation:   person, place, time/date, and situation  Judgment:   Fair  Planning:   Fair  Affect:    Excited  Mood:    Euthymic  Insight:   Fair  Intelligence:   very high  Marital Status/Living: The patient was born and raised in [place] along with 2 siblings.  He is married and continues to live with his wife.  They have been married for 38 years.  The patient had 2 previous marriages lasting 3 and 4 years respectively.  Current Employment: The patient is retired.  Past Employment:  Patient was the president/CEO of a large  corporation and had worked for [employer] for 39 years.  Substance Use:  Patient has used alcohol fairly regularly in the past and reports that he is continue to do so but on a limited basis.  I advised him to stay away from all alcohol during his recovery.  No other substance use is noted.  Education:   Patient completed his MBA from [employer].  Medical History:   Past Medical History:  Diagnosis Date   Chronic lower back pain    "q am; cause I'm overweight" (04/21/2012)   Essential familial hyperlipidemia    GERD (gastroesophageal reflux disease)    Hyperlipidemia    Hypertension    Shortness of breath    "lying down; sometimes" (04/21/2012)   Stroke ([facility]) 04/21/2012   "they are leaning towards mini stroke today; all S/S still gone now except little chest pain" (04/21/2012)         Patient Active Problem List   Diagnosis Date Noted   PAF (paroxysmal atrial fibrillation) ([facility])    Hyponatremia    Slow transit constipation    Essential hypertension    TBI (traumatic brain injury) ([facility]) 05/12/2020   Contusion of face    Subarachnoid hemorrhage following injury, with loss of consciousness ([facility])    Agitation    Hypokalemia    Subdural hematoma ([facility]) 05/04/2020   Subarachnoid bleed ([facility]) 05/04/2020   Nonallopathic lesion of sacral region 05/06/2017   Nonallopathic lesion of lumbar region 05/06/2017   Nonallopathic lesion of thoracic region 05/06/2017   SI (sacroiliac) joint dysfunction 04/22/2017   Acute encephalopathy 04/22/2012   TIA (transient ischemic attack) 04/22/2012   HTN (hypertension) 04/21/2012   Delirium 04/21/2012   Obesity 04/21/2012   Hyperlipidemia 04/21/2012   GERD (gastroesophageal reflux disease) 04/21/2012              Abuse/Trauma History: The patient has had mechanical traumas in the past with 1 falling off a ladder in 2009, prior TIA around 2014 or so and more recent unobserved fall with significant TBI.  Psychiatric History:  No prior  psychiatric history  Family Med/Psych History: No family history on file.   Impression/DX:  Caius Silbernagel is a 65 year old right-handed male with a prior history of CVA maintained on aspirin, COVID infection in March and then again in December 2021, hypertension, hyperlipidemia, history of prior significant concussive event and history of alcohol use.  Patient reports a prior concussive event 2015 where he fell off a ladder face first breaking his nose and causing a loss of consciousness and dislocation of his left shoulder.  Patient also had a TIA and 2014 clearing up the next day.  Patient presented to the emergency department on 05/04/2020 after being found down by his wife with altered mental status and aphasia.  Wife reported patient having recent headaches.  Specific events  around a likely fall with head trauma are unclear as the patient was found in the house with a broken back door but suspicion that he had fallen at the front door made his way around to the back door.  Patient with retrograde and anterograde amnesia around the events of his fall.  Cranial CT scan showed bilateral subdural hematomas without midline shift.  Diffuse subarachnoid hemorrhage predominantly in the sylvian fissures with sparing of the basilar cisterns noted.  There was bifrontal hemorrhagic contusions noted.  CT scans later showed blooming of hemorrhagic contusions in the bilateral inferior frontal lobes and right cerebellum.  Subsequent imaging measured his right frontal parietal subdural hematoma as much is 7 mm in thickness.  There was also findings of nondisplaced right occipital bone fracture.  Neurosurgery advised conservative care.  Patient had extended periods of agitation initially requiring sedation.  Agitation, confusion and impulsivity with balance deficits were prominent throughout his inpatient hospitalization stay and I saw the patient while he was on the acute comprehensive inpatient  rehabilitation program during his hospitalization.  Disposition/Plan:  We have set the patient up for therapeutic interventions to cope with his residual neurocognitive changes following his TBI.  Because of scheduling challenges the next available appointment will be on 01/04/2021 but we will work on getting those moved sooner if possible.  Diagnosis:    Subarachnoid hemorrhage following injury, with loss of consciousness, sequela ([facility])  Traumatic brain injury, with loss of consciousness of 31 minutes to 59 minutes, sequela ([facility])         Electronically Signed   _______________________ Arley Phenix, Psy.D. Clinical Neuropsychologist

## 2021-01-04 ENCOUNTER — Encounter: Payer: Medicare Other | Admitting: Psychology

## 2021-03-22 ENCOUNTER — Encounter: Payer: Medicare Other | Admitting: Psychology

## 2021-03-29 ENCOUNTER — Ambulatory Visit: Payer: Medicare Other | Admitting: Psychology

## 2021-04-04 ENCOUNTER — Ambulatory Visit: Payer: Medicare Other | Admitting: Psychology

## 2021-06-08 ENCOUNTER — Encounter (HOSPITAL_COMMUNITY): Payer: Self-pay | Admitting: Radiology

## 2021-07-06 ENCOUNTER — Telehealth: Payer: Self-pay

## 2021-07-06 NOTE — Telephone Encounter (Signed)
NOTES SCANNED TO REFERRAL 

## 2021-08-02 ENCOUNTER — Ambulatory Visit: Payer: Medicare Other | Admitting: Cardiology

## 2021-08-02 ENCOUNTER — Encounter: Payer: Self-pay | Admitting: Cardiology

## 2021-08-02 VITALS — BP 113/77 | HR 84 | Temp 98.5°F | Resp 16 | Ht 69.0 in | Wt 220.2 lb

## 2021-08-02 DIAGNOSIS — S066X9S Traumatic subarachnoid hemorrhage with loss of consciousness of unspecified duration, sequela: Secondary | ICD-10-CM

## 2021-08-02 DIAGNOSIS — E7800 Pure hypercholesterolemia, unspecified: Secondary | ICD-10-CM

## 2021-08-02 DIAGNOSIS — Z8679 Personal history of other diseases of the circulatory system: Secondary | ICD-10-CM

## 2021-08-02 DIAGNOSIS — I77810 Thoracic aortic ectasia: Secondary | ICD-10-CM

## 2021-08-02 DIAGNOSIS — I10 Essential (primary) hypertension: Secondary | ICD-10-CM

## 2021-08-02 NOTE — Progress Notes (Signed)
? ?Primary Physician/Referring:  Reynold Bowen, MD ? ?Patient ID: Timothy Case, male    DOB: Aug 29, 1955, 66 y.o.   MRN: [id number] ? ?Chief Complaint  ?Patient presents with  ? Atrial Fibrillation  ? New Patient (Initial Visit)  ? ?HPI:   ? ?Timothy Case  is a 66 y.o. Caucasian male patient with history of TIA in 2014, history of ?alcohol excess use, hypertension, hyperlipidemia, last hospitalization on 05/04/2020 for subdural hematoma after a fall with altered mental status, also had subarachnoid hemorrhage and bifrontal hemorrhage and right occipital bone fracture.  He also has history of asymptomatic renal stones. ? ?He is referred to me for cardiac risk stratification, remains asymptomatic.  He is a non-smoker, has hypertension and hyperlipidemia. ? ?Past Medical History:  ?Diagnosis Date  ? Chronic lower back pain   ? "q am; cause I'm overweight" (04/21/2012)  ? GERD (gastroesophageal reflux disease)   ? Hyperlipidemia   ? Hypertension   ? Stroke ([place]) 04/21/2012  ? ?TIA  ? ?Past Surgical History:  ?Procedure Laterality Date  ? KNEE ARTHROSCOPY  ~ 2008  ? "both knees; ~ 6 months apart" (04/21/2012)  ? ?History reviewed. No pertinent family history.  ?Social History  ? ?Tobacco Use  ? Smoking status: Never  ? Smokeless tobacco: Never  ?Substance Use Topics  ? Alcohol use: Not Currently  ?  Alcohol/week: 6.0 standard drinks  ?  Types: 6 Glasses of wine per week  ?  Comment: 04/21/2012 "few glasses of wine a few days in a row; none in a few days; @ the most I'll have is 2 glass of wine/day; average ~ 6 glasses/wk"  ? ?Marital Status: Married  ?ROS  ?Review of Systems  ?Cardiovascular:  Negative for chest pain, dyspnea on exertion and leg swelling.  ?Objective  ?Blood pressure 113/77, pulse 84, temperature 98.5 ?F (36.9 ?C), temperature source Temporal, resp. rate 16, height '5\' 9"'  (1.753 m), weight 220 lb 3.2 oz (99.9 kg), SpO2 96 %. Body mass index is 32.52 kg/m?.  ? ?  08/02/2021  ? 12:38  PM 06/14/2020  ?  1:25 PM 06/08/2020  ?  9:32 AM  ?Vitals with BMI  ?Height '5\' 9"'  '5\' 9"'  '5\' 9"'   ?Weight 220 lbs 3 oz 216 lbs 3 oz 210 lbs  ?BMI 32.5 31.91 31  ?Systolic 283 662 947  ?Diastolic 77 75 87  ?Pulse 84 69 65  ?  ?Physical Exam  ?Neck: No JVD present.  ?Cardiovascular: Regular rhythm, normal heart sounds, intact distal pulses and normal pulses. Exam reveals no gallop.  ?No murmur heard. ?Pulmonary/Chest: Effort normal and breath sounds normal.  ?Abdominal: Soft. Bowel sounds are normal.  ?Musculoskeletal:     ?   General: No edema.  ? ?Medications and allergies  ?No Known Allergies  ? ?Medication list after today's encounter  ? ?Current Outpatient Medications:  ?  acetaminophen (TYLENOL) 325 MG tablet, Take 2 tablets (650 mg total) by mouth every 4 (four) hours as needed for mild pain (or Fever >/= 101)., Disp: , Rfl:  ?  fluticasone (FLONASE) 50 MCG/ACT nasal spray, Place 2 sprays into the nose daily as needed for allergies., Disp: , Rfl:  ?  levocetirizine (XYZAL) 5 MG tablet, Take 5 mg by mouth daily as needed for allergies., Disp: , Rfl:  ?  polyethylene glycol (MIRALAX / GLYCOLAX) 17 g packet, Take 17 g by mouth 2 (two) times daily., Disp: 14 each, Rfl: 0 ?  ramipril (ALTACE) 10 MG capsule,  Take 10 mg by mouth daily., Disp: , Rfl:  ?  rosuvastatin (CRESTOR) 20 MG tablet, Take 1 tablet (20 mg total) by mouth daily., Disp: 30 tablet, Rfl: 0 ? ?Laboratory examination:  ? ?TSH ?No results for input(s): TSH in the last 8760 hours. ? ?External labs:  ? ?11/11/2020: ? ?BUN 24, creatinine 1.1, EGFR 66/80 mL, potassium 4.4, LFTs normal. ? ?Hb 17.7/HCT 52.2, platelets 233.  Normal indicis. ? ?Total cholesterol 147, triglycerides 84, HDL 52, LDL 78. ? ?A1c 5.8%. ? ?Radiology:  ? ?Chest x-ray single view 05/07/2020: ?1. The appearance of the chest is favored to reflect multilobar ?bilateral pneumonia, concerning for potential viral pneumonia such ?as COVID infection. ? ?Cardiac Studies:  ? ?Treadmill Exercise Stress  03/17/2018: ?Per record evaluation, complete test not available to me, negative for ischemia with excellent exercise tolerance. ? ?Echocardiogram 05/10/2020: ? 1. Left ventricular ejection fraction, by estimation, is 60 to 65%. The left ventricle has normal function. The left ventricle has no regional wall motion abnormalities. There is moderate left ventricular hypertrophy. Left ventricular diastolic  ?parameters are consistent with Grade II diastolic dysfunction (pseudonormalization). Elevated left ventricular end-diastolic pressure. ? 2. Right ventricular systolic function is normal. The right ventricular size is normal. ? 3. The aortic valve is tricuspid. Aortic valve regurgitation is not visualized. Mild to moderate aortic valve sclerosis/calcification is present, without any evidence of aortic stenosis. ? 4. Aortic dilatation noted. There is mild dilatation of the ascending aorta, measuring 38 mm. ? 5. The inferior vena cava is normal in size with greater than 50% respiratory variability, suggesting right atrial pressure of 3 mmHg. ? ?EKG:  ? ?EKG 08/02/2021: Normal sinus rhythm at a rate of 76 bpm, low-voltage complexes, single PVC.   ? ?EKG 05/08/2020: Atrial fibrillation with rapid ventricular sponsor at rate of 128 bpm, normal axis, incomplete right bundle branch block.  No evidence of ischemia. ? ?Assessment  ? ?  ICD-10-CM   ?1. History of atrial fibrillation  Z86.79 EKG 12-Lead  ?  ?2. Subarachnoid hemorrhage following injury, with loss of consciousness, sequela ([place])  S06.6X9S   ?  ?3. Aortic root dilatation (HCC)  I77.810 PCV AORTA DUPLEX  ?  ?4. Primary hypertension  I10 PCV AORTA DUPLEX  ?  ?5. Hypercholesteremia  E78.00   ?  ?  ? ?Medications Discontinued During This Encounter  ?Medication Reason  ? propranolol ER (INDERAL LA) 120 MG 24 hr capsule   ? omeprazole (PRILOSEC) 40 MG capsule   ? lisinopril (ZESTRIL) 10 MG tablet   ? Multiple Vitamin (MULTIVITAMIN WITH MINERALS) TABS tablet   ?  ?No orders  of the defined types were placed in this encounter. ? ?Orders Placed This Encounter  ?Procedures  ? EKG 12-Lead  ? ?Recommendations:  ? ?Timothy Case is a 66 y.o.  Caucasian male patient with history of TIA in 2014, history of ?alcohol excess use, hypertension, hyperlipidemia, last hospitalization on 05/04/2020 for subdural hematoma after a fall with altered mental status, also had subarachnoid hemorrhage and bifrontal hemorrhage and right occipital bone fracture.  He also has history of asymptomatic renal stones. ? ?Fortunately he has recovered very well.  I reviewed his extensive medical records from prior evaluation including [facility] annual "executive physical".  He has been getting >10,000 steps a day without any chest pain or dyspnea.  With regard to cardiovascular risk factors, he is already on a statin and blood pressure is well controlled with a beta-blocker and an ACE inhibitor.  As his risk factors are well controlled, remains asymptomatic, his metabolic equivalent with physical activity is >7 METS, I do not think he needs any further cardiac work-up. ? ?We have discussed regarding weight loss and continued primary prevention.  I do not think he needs coronary calcium score as he is already on appropriate medical therapy and remains asymptomatic. ? ?I reviewed his echocardiogram, minimal aortic root dilatation at 37 mm in the patient who is well-built and 5 feet 9 inches and has mild hypertension is probably within normal limits.  He does not smoke, he is not a diabetic, his risk for aortic aneurysm is low.  If he has not had routine one-time abdominal aortic duplex.  I will see him back on a as needed basis unless there is presence of AAA. ? ? ?Adrian Prows, MD, [facility] ?08/05/2021, 10:33 AM ?Office: [phone] ?

## 2021-08-05 ENCOUNTER — Encounter: Payer: Self-pay | Admitting: Cardiology

## 2021-09-12 ENCOUNTER — Ambulatory Visit: Payer: Medicare Other

## 2021-09-12 DIAGNOSIS — I77819 Aortic ectasia, unspecified site: Secondary | ICD-10-CM

## 2021-09-12 DIAGNOSIS — I77810 Thoracic aortic ectasia: Secondary | ICD-10-CM

## 2021-09-12 DIAGNOSIS — I10 Essential (primary) hypertension: Secondary | ICD-10-CM

## 2021-09-12 DIAGNOSIS — I700 Atherosclerosis of aorta: Secondary | ICD-10-CM

## 2021-09-13 ENCOUNTER — Other Ambulatory Visit: Payer: PRIVATE HEALTH INSURANCE

## 2021-09-16 NOTE — Progress Notes (Signed)
Please set up OV with me in 6 months after Abdominal Aortic Duplex. I am repeating the study in 6 months. Let him know there is plaque and mild dilatation of the aorta and needs f/u. Nothing needs to be changed with regards to his treatment for now

## 2021-09-20 NOTE — Progress Notes (Signed)
Called and spoke with patient regarding his AAD results. Patient has already scheduled FU appointment and as he read in MyChart.

## 2022-03-04 IMAGING — CT CT MAXILLOFACIAL W/O CM
4 series · 16 of 47 positions shown, 18 images · non-contrast
Comparison: CT head 04/21/2012.

CLINICAL DATA: Patient found down.  Fall.  Headache last night.

EXAM:
CT HEAD WITHOUT CONTRAST
CT MAXILLOFACIAL WITHOUT CONTRAST
CT CERVICAL SPINE WITHOUT CONTRAST
TECHNIQUE: Multidetector CT imaging of the head, cervical spine, and
maxillofacial structures were performed using the standard protocol
without intravenous contrast. Multiplanar CT image reconstructions
of the cervical spine and maxillofacial structures were also
generated.

[Series 4: facial/ orbits 2.0 h30s · axial · 0.36mm/px · z∈[-212,-66]mm · 8 of 95 slices shown, 10 images]
[im 11/95  brain]
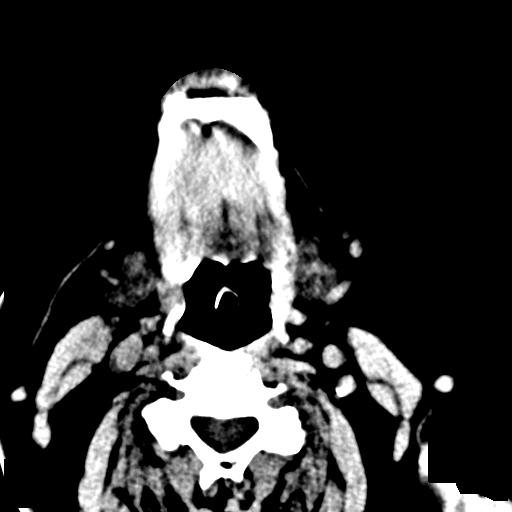
[im 11/95  bone]
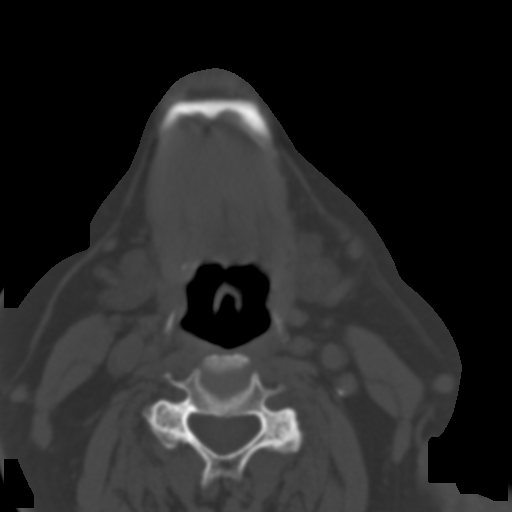
[im 21/95  bone]
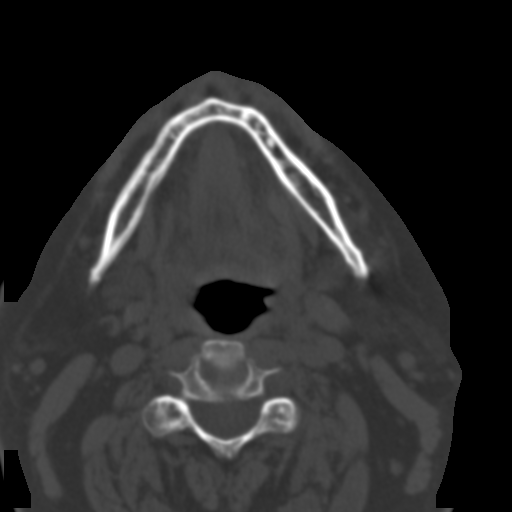
[im 32/95  bone]
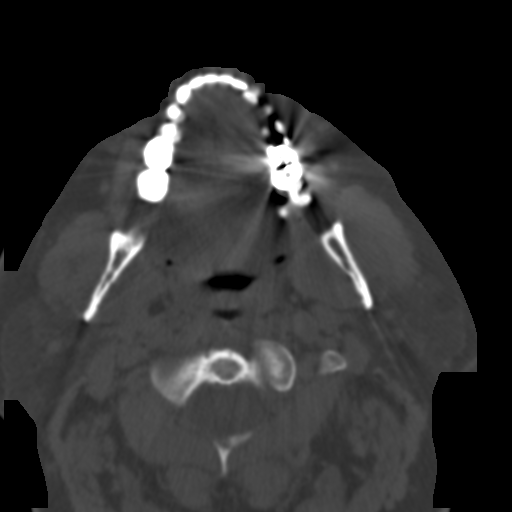
[im 42/95  bone]
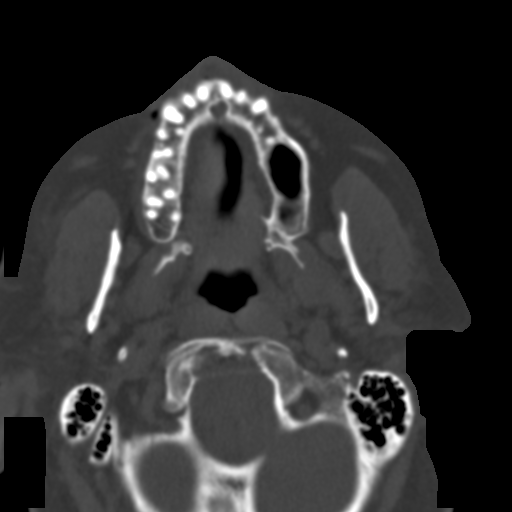
[im 53/95  brain]
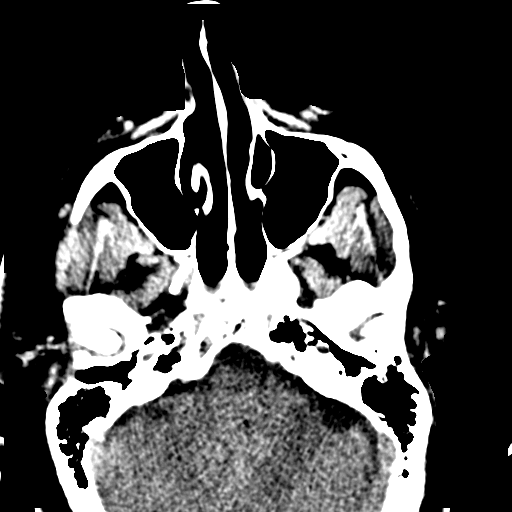
[im 53/95  bone]
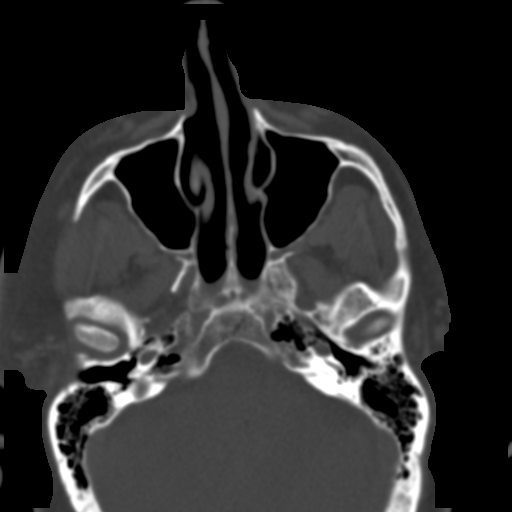
[im 63/95  bone]
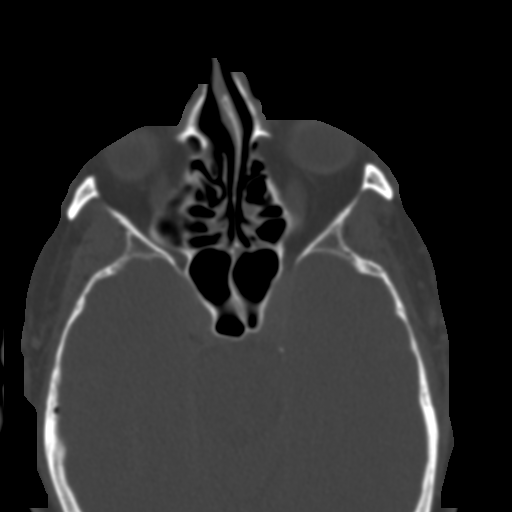
[im 74/95  bone]
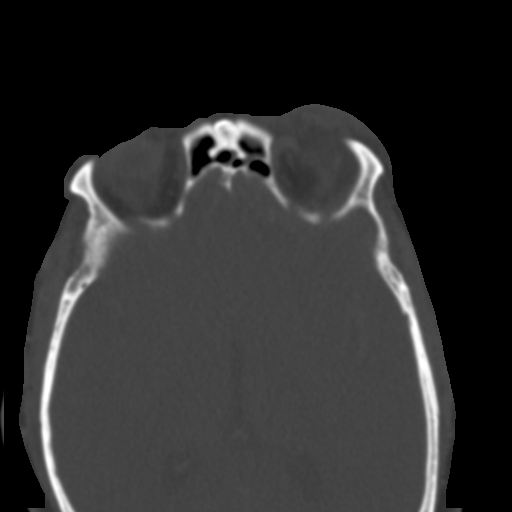
[im 84/95  bone]
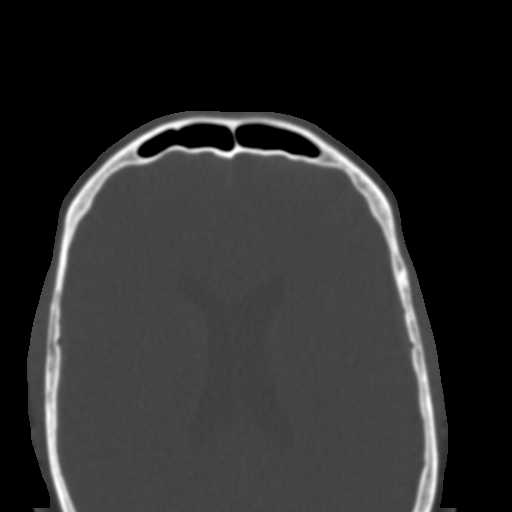

[Series 6: 1.0 thin soft tissue · axial · 0.36mm/px · z∈[-214,-194]mm · 2 of 190 slices shown]
[im 20/190  brain]
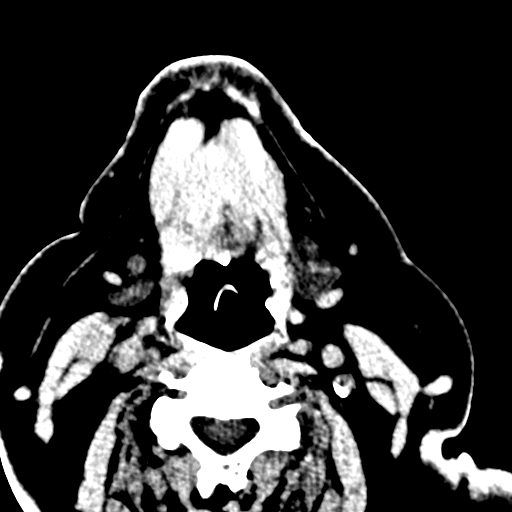
[im 40/190  brain]
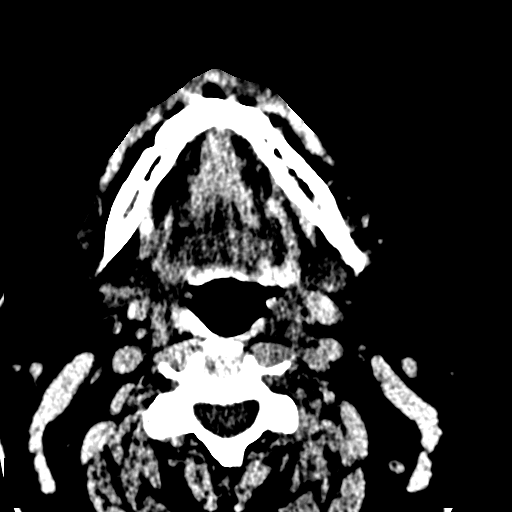

[Series 7: coronal soft tissue · coronal · 0.42mm/px · 3 of 104 slices shown]
[im 35/104  bone]
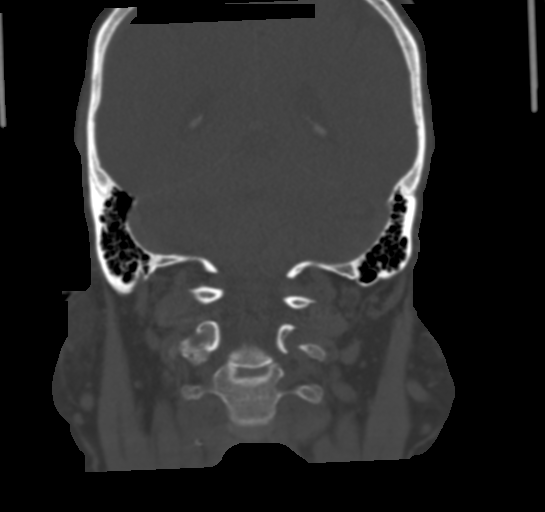
[im 46/104  bone]
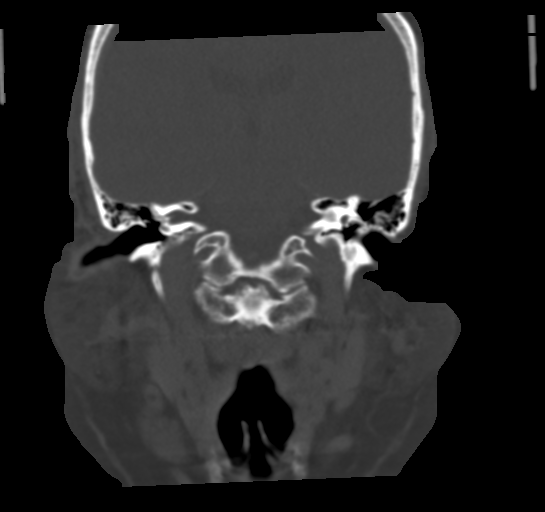
[im 58/104  bone]
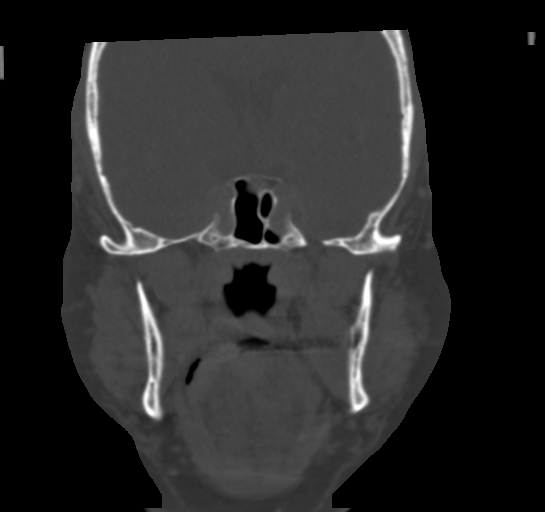

[Series 8: sagittal soft tissue · sagittal · 0.42mm/px · 3 of 99 slices shown]
[im 33/99  bone]
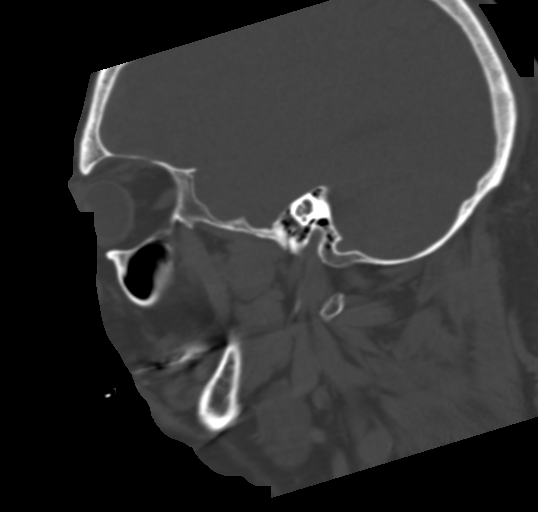
[im 50/99  bone]
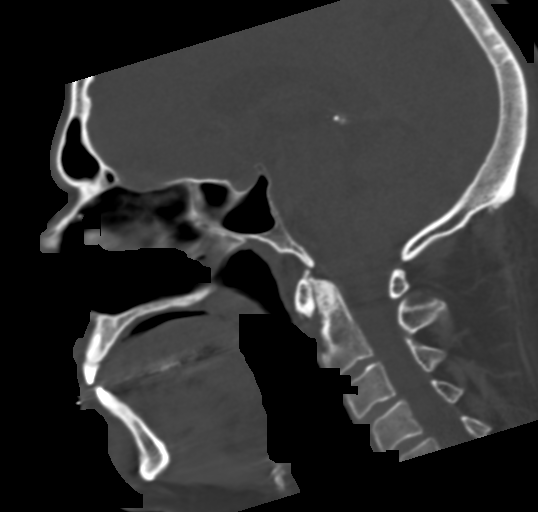
[im 66/99  bone]
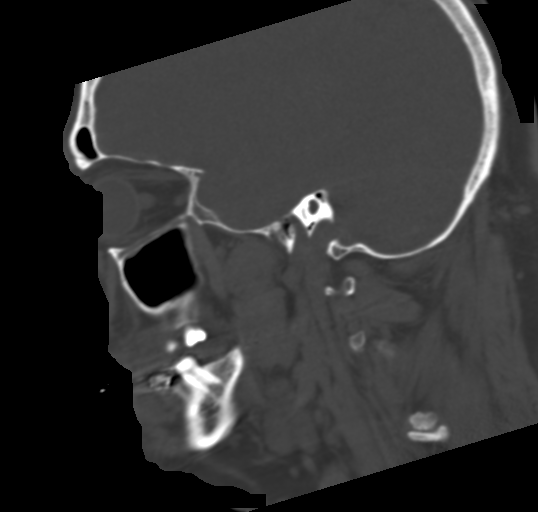

[16 of 47 positions shown; findings below may reference images not displayed]

FINDINGS: CT HEAD FINDINGS

Brain: Small amount of subarachnoid hemorrhage in the sylvian
fissure bilaterally. Mild diffuse subarachnoid hemorrhage including
right occipital lobe. Basilar cisterns without significant
subarachnoid hemorrhage. Bifrontal hemorrhagic contusions. Small
amount of frontal subarachnoid hemorrhage. Small hemorrhagic
contusion in the right cerebellum.

Small left frontal subdural hematoma. Right subdural hematoma
measuring approximately 6 mm in thickness over the convexity.

Ventricle size normal.  No midline shift.  No acute infarct or mass.

Vascular: No hyperdense vessel. Blood in the sylvian fissure
obscures the middle cerebral artery bilaterally.

Skull: Negative for skull fracture.

Other: None

CT MAXILLOFACIAL FINDINGS

Osseous: Negative for facial fracture

Orbits: No fracture of the orbit. Negative for orbital mass or
hematoma. Normal globe. There is soft tissue swelling overlying the
left eye

Sinuses: Mucosal edema throughout the paranasal sinuses without
air-fluid level

Soft tissues: Moderate soft tissue swelling overlying the left eye
compatible with contusion.

CT CERVICAL SPINE FINDINGS

Alignment: Mild anterolisthesis C4-5

Skull base and vertebrae: Negative for fracture. Motion degraded
study.

Soft tissues and spinal canal: Negative

Disc levels: Multilevel disc and facet degeneration throughout the
cervical spine. Multilevel foraminal stenosis. Mild spinal stenosis
C5-6 and C6-7.

Upper chest: Not image

Other: None
IMPRESSION: 1. Bilateral small subdural hematomas without midline shift. Diffuse
subarachnoid hemorrhage which is predominately in the sylvian
fissures with sparing of the basilar cisterns. There are bifrontal
hemorrhagic contusions. There is a small left cerebellar hemorrhagic
contusion. Pattern most consistent with trauma rather than aneurysm
rupture.
2. Negative for facial fracture
3. Negative for cervical spine fracture.

## 2022-03-04 IMAGING — CT CT ANGIO HEAD
4 of 8 series · 18 of 47 positions shown · IV contrast (APPLIED)
Comparison: CT head 05/04/2020

CLINICAL DATA: Patient found down.  Intracranial hemorrhage.

EXAM:
CT ANGIOGRAPHY HEAD
TECHNIQUE: Multidetector CT imaging of the head was performed using the
standard protocol during bolus administration of intravenous
contrast. Multiplanar CT image reconstructions and MIPs were
obtained to evaluate the vascular anatomy.
CONTRAST:  100mL OMNIPAQUE IOHEXOL 350 MG/ML SOLN

[Series 5: cta brain 2.0 i30f 3 · axial · 0.47mm/px · z∈[-117,-43]mm · 3 of 93 slices shown]
[im 19/93  brain]
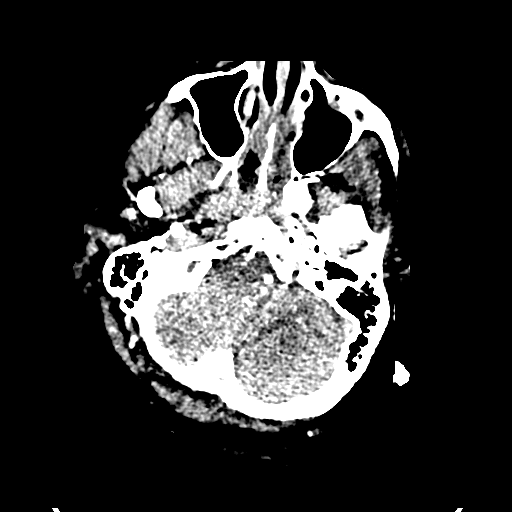
[im 37/93  brain]
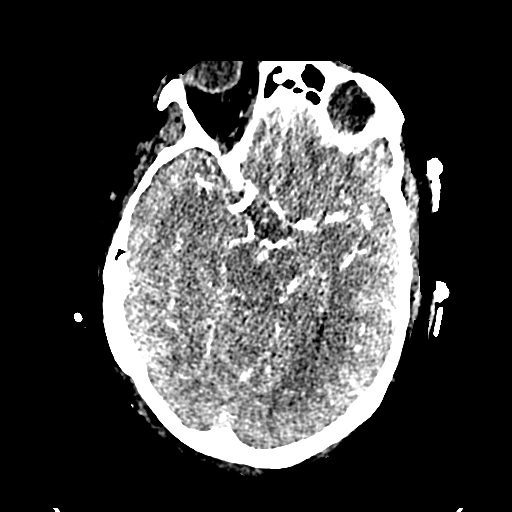
[im 56/93  brain]
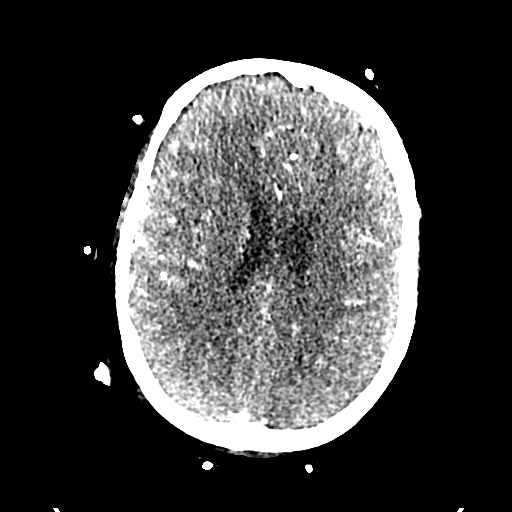

[Series 6: thin axial · axial · 0.47mm/px · z∈[-135,+12]mm · 9 of 185 slices shown]
[im 19/185  brain]
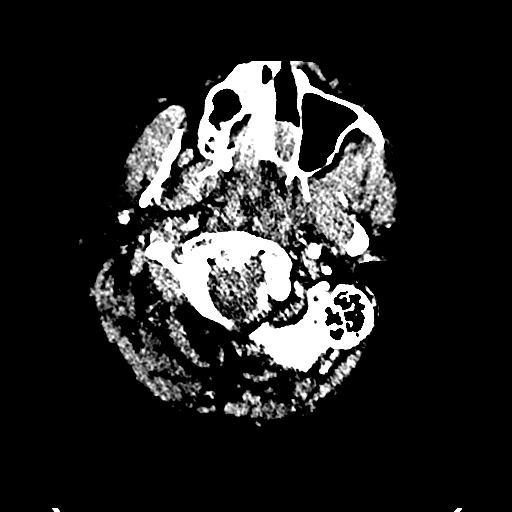
[im 37/185  bone]
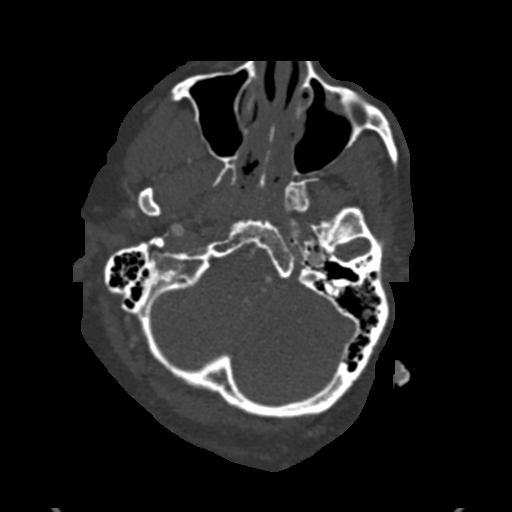
[im 56/185  brain]
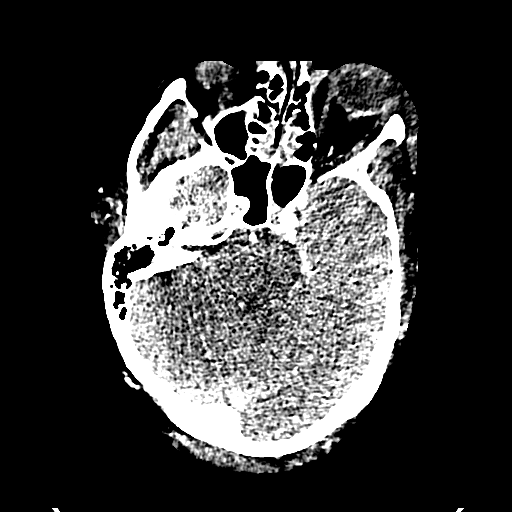
[im 74/185  bone]
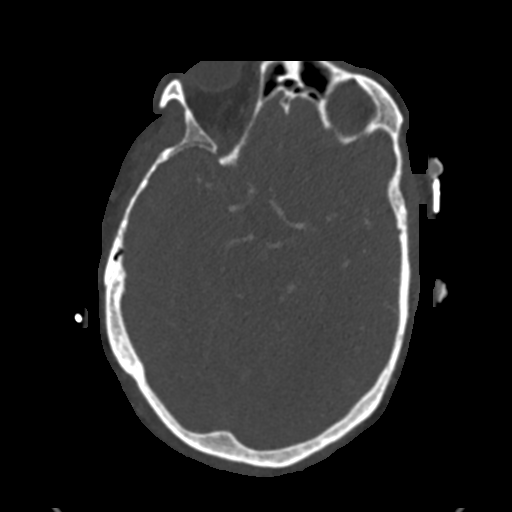
[im 93/185  brain]
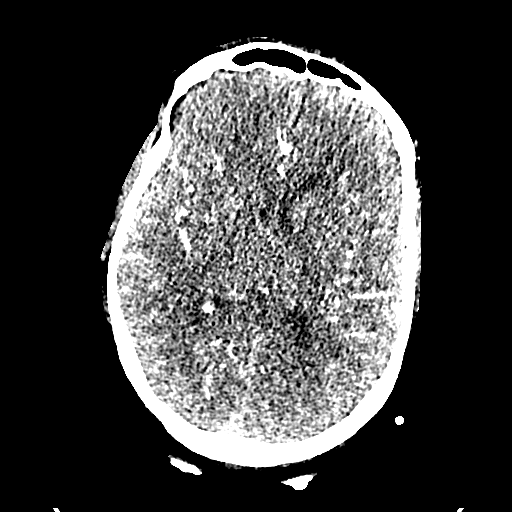
[im 111/185  bone]
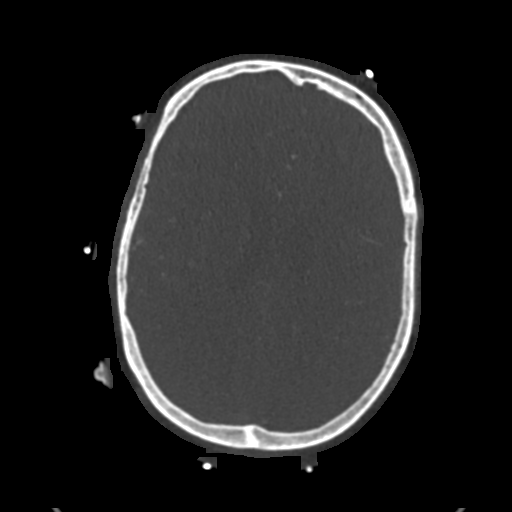
[im 129/185  brain]
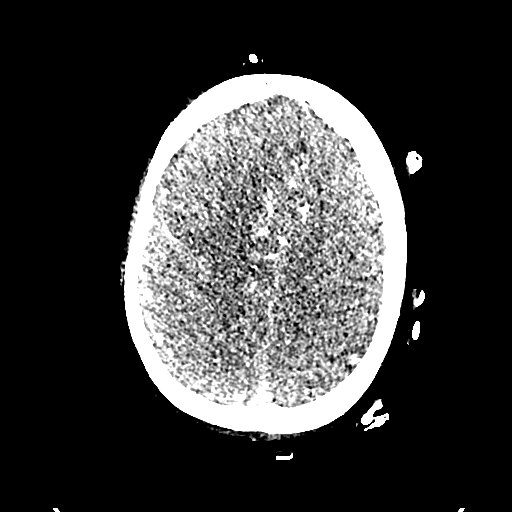
[im 148/185  bone]
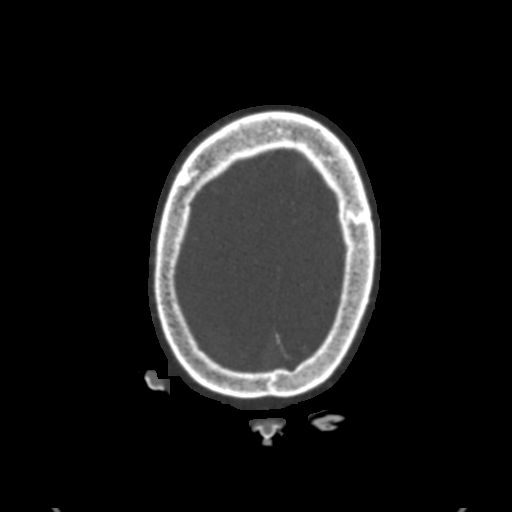
[im 166/185  brain]
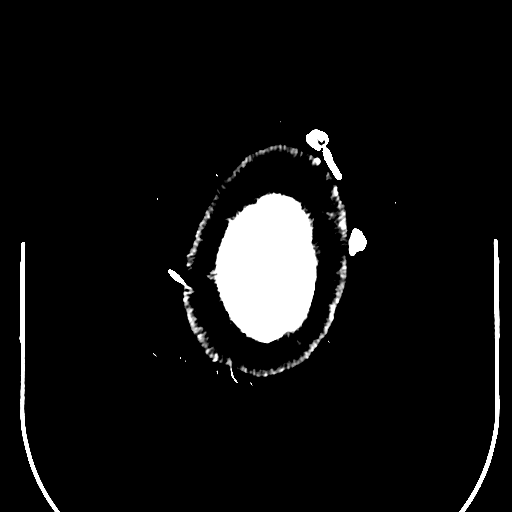

[Series 13: sagittal thin · sagittal · 0.37mm/px · 3 of 184 slices shown]
[im 37/184  brain]
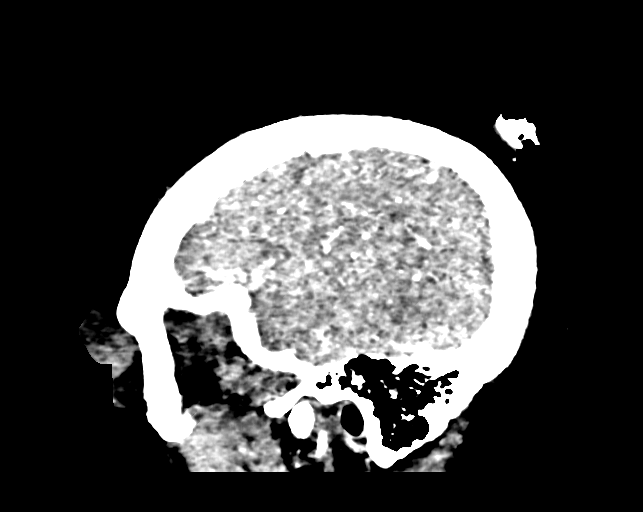
[im 74/184  brain]
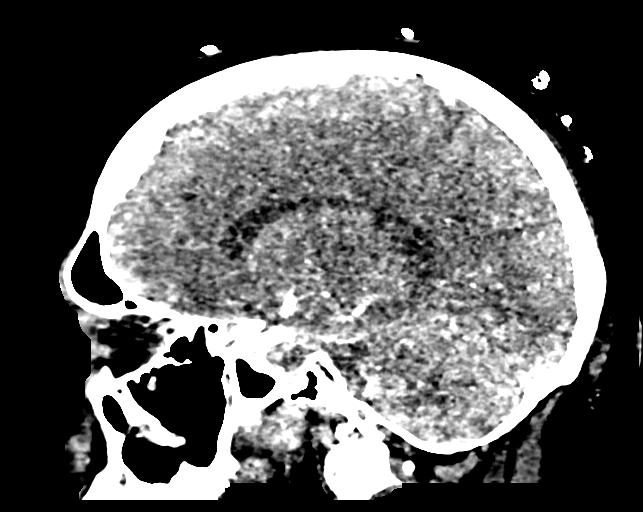
[im 110/184  brain]
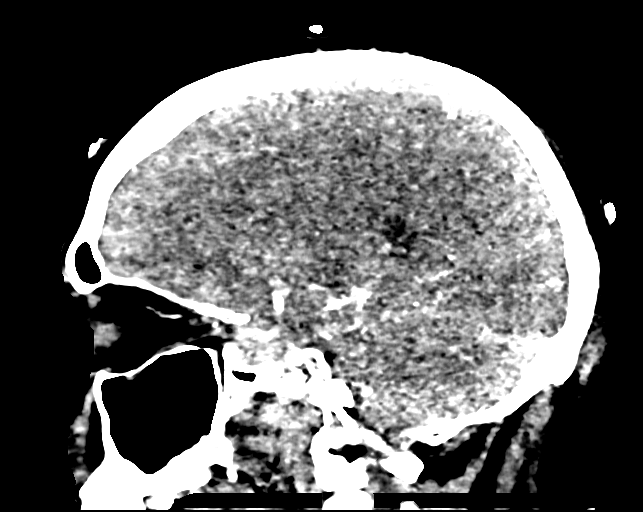

[Series 15: coronal thin · coronal · 0.37mm/px · 3 of 236 slices shown]
[im 68/236  brain]
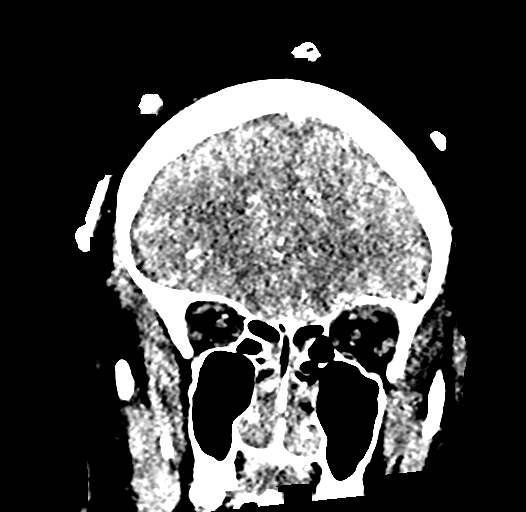
[im 101/236  brain]
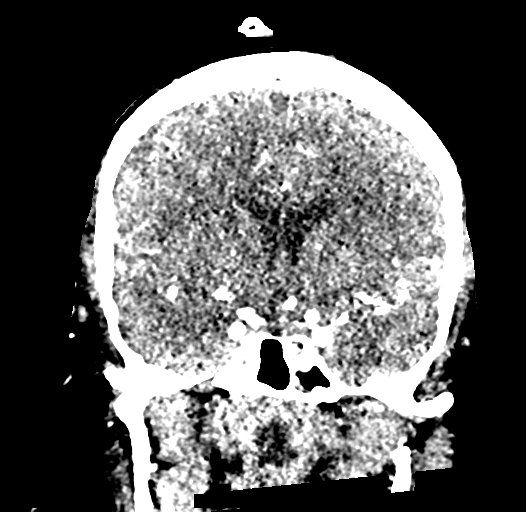
[im 135/236  brain]
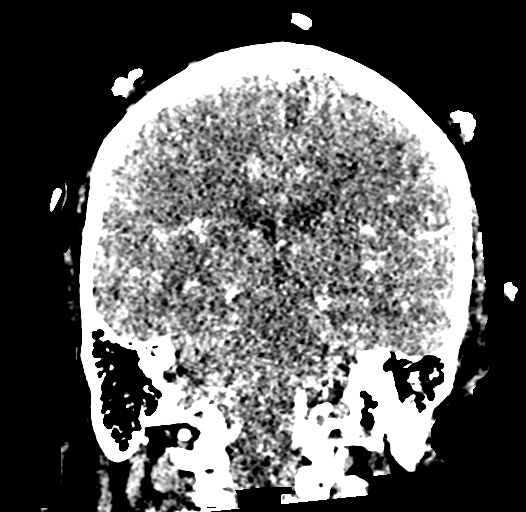

[18 of 47 positions shown; findings below may reference images not displayed]

FINDINGS: CTA HEAD

Anterior circulation: Internal carotid artery widely patent through
the skull base. Hypoplastic right A1 segment. Anterior and middle
cerebral arteries widely patent without stenosis. Negative for
vascular malformation.

Posterior circulation: Both vertebral arteries patent to the
basilar. Left PICA patent. Right PICA not visualized. Right AICA
patent. Basilar widely patent. Superior cerebellar and posterior
cerebral arteries patent without stenosis. Fetal origin right
posterior cerebral artery. Negative for vascular malformation.

Venous sinuses: Limited venous enhancement due to arterial phase
scanning.

Anatomic variants: None
IMPRESSION: Negative CTA head.  No vascular malformation

Intracranial hemorrhage described on prior CT head report.

## 2022-03-12 ENCOUNTER — Other Ambulatory Visit: Payer: PRIVATE HEALTH INSURANCE

## 2022-03-13 ENCOUNTER — Ambulatory Visit: Payer: Medicare Other

## 2022-03-13 DIAGNOSIS — I77819 Aortic ectasia, unspecified site: Secondary | ICD-10-CM

## 2022-03-13 DIAGNOSIS — I700 Atherosclerosis of aorta: Secondary | ICD-10-CM

## 2022-03-20 ENCOUNTER — Ambulatory Visit: Payer: Medicare Other | Admitting: Cardiology

## 2022-03-20 ENCOUNTER — Encounter: Payer: Self-pay | Admitting: Cardiology

## 2022-03-20 VITALS — BP 131/87 | HR 78 | Resp 16 | Ht 69.0 in | Wt 195.0 lb

## 2022-03-20 DIAGNOSIS — I700 Atherosclerosis of aorta: Secondary | ICD-10-CM

## 2022-03-20 DIAGNOSIS — I10 Essential (primary) hypertension: Secondary | ICD-10-CM

## 2022-03-20 DIAGNOSIS — I7141 Pararenal abdominal aortic aneurysm, without rupture: Secondary | ICD-10-CM

## 2022-03-20 DIAGNOSIS — E7800 Pure hypercholesterolemia, unspecified: Secondary | ICD-10-CM

## 2022-03-20 MED ORDER — ASPIRIN 81 MG PO CHEW: 81.0000 mg | CHEWABLE_TABLET | Freq: Every day | ORAL | Status: AC

## 2022-03-20 NOTE — Progress Notes (Unsigned)
Primary Physician/Referring:  Reynold Bowen, MD  Patient ID: Timothy Case, male    DOB: [birth year], 66 y.o.   MRN: [id number]  Chief Complaint  Patient presents with   Atrial Fibrillation   Hypertension   Hyperlipidemia   HPI:    Timothy Case  is a 66 y.o. Caucasian male patient with history of TIA in 2014, history of ?alcohol excess use, hypertension, hyperlipidemia, last hospitalization on 05/04/2020 for subdural hematoma after a fall with altered mental status, also had subarachnoid hemorrhage and bifrontal hemorrhage and right occipital bone fracture.  He also has history of asymptomatic renal stones.  He is referred to me for cardiac risk stratification, remains asymptomatic.  He is a non-smoker, has hypertension and hyperlipidemia.  Past Medical History:  Diagnosis Date   Chronic lower back pain    "q am; cause I'm overweight" (04/21/2012)   GERD (gastroesophageal reflux disease)    Hyperlipidemia    Hypertension    Stroke ([place]) 04/21/2012   ?TIA   Past Surgical History:  Procedure Laterality Date   KNEE ARTHROSCOPY  ~ 2008   "both knees; ~ 6 months apart" (04/21/2012)   History reviewed. No pertinent family history.  Social History   Tobacco Use   Smoking status: Never   Smokeless tobacco: Never  Substance Use Topics   Alcohol use: Not Currently    Alcohol/week: 6.0 standard drinks of alcohol    Types: 6 Glasses of wine per week    Comment: 04/21/2012 "few glasses of wine a few days in a row; none in a few days; @ the most I'll have is 2 glass of wine/day; average ~ 6 glasses/wk"   Marital Status: Married  ROS  Review of Systems  Cardiovascular:  Negative for chest pain, dyspnea on exertion and leg swelling.   Objective  Blood pressure 131/87, pulse 78, resp. rate 16, height _0  (1.753 m), weight 195 lb (88.5 kg), SpO2 97 %. Body mass index is 28.8 kg/m.     03/20/2022    1:31 PM 08/02/2021   12:38 PM 06/14/2020    1:25 PM   Vitals with BMI  Height _1  _2  _3   Weight 195 lbs 220 lbs 3 oz 216 lbs 3 oz  BMI 28.78 28.3 15.17  Systolic 616 073 710  Diastolic 87 77 75  Pulse 78 84 69    Physical Exam  Neck: No JVD present.  Cardiovascular: Regular rhythm, normal heart sounds, intact distal pulses and normal pulses. Exam reveals no gallop.  No murmur heard. Pulmonary/Chest: Effort normal and breath sounds normal.  Abdominal: Soft. Bowel sounds are normal.  Musculoskeletal:        General: No edema.    Medications and allergies  No Known Allergies   Medication list after today's encounter   Current Outpatient Medications:    acetaminophen (TYLENOL) 325 MG tablet, Take 2 tablets (650 mg total) by mouth every 4 (four) hours as needed for mild pain (or Fever >/= 101)., Disp: , Rfl:    aspirin (ASPIRIN CHILDRENS) 81 MG chewable tablet, Chew 1 tablet (81 mg total) by mouth daily., Disp: , Rfl:    cyanocobalamin (VITAMIN B12) 1000 MCG tablet, Take 3 tablets by mouth daily., Disp: , Rfl:    fluticasone (FLONASE) 50 MCG/ACT nasal spray, Place 2 sprays into the nose daily as needed for allergies., Disp: , Rfl:    levocetirizine (XYZAL) 5 MG tablet, Take 5 mg by mouth daily as needed for allergies., Disp: ,  Rfl:    OZEMPIC, 1 MG/DOSE, 4 MG/3ML SOPN, Inject 1 mg into the skin once a week., Disp: , Rfl:    polyethylene glycol (MIRALAX / GLYCOLAX) 17 g packet, Take 17 g by mouth 2 (two) times daily., Disp: 14 each, Rfl: 0   ramipril (ALTACE) 10 MG capsule, Take 10 mg by mouth daily., Disp: , Rfl:    rosuvastatin (CRESTOR) 20 MG tablet, Take 1 tablet (20 mg total) by mouth daily., Disp: 30 tablet, Rfl: 0  Laboratory examination:   External labs:   Labs 09/13/2021:  A1c 5.7%.  TSH 1.05, normal.  Labs 03/04/2022:  Serum glucose 99 mg, BUN 15, creatinine 0.9, EGFR 83/101 mL, potassium 5.6, LFTs normal.  Hb 17.2/HCT 50.9, platelets 216.  Total cholesterol 163, triglycerides 138, HDL 57, LDL  78.  Radiology:   Chest x-ray single view 05/07/2020: 1. The appearance of the chest is favored to reflect multilobar bilateral pneumonia, concerning for potential viral pneumonia such as COVID infection.  Cardiac Studies:   Treadmill Exercise Stress 03/17/2018: Per record evaluation, complete test not available to me, negative for ischemia with excellent exercise tolerance.  Echocardiogram 05/10/2020:  1. Left ventricular ejection fraction, by estimation, is 60 to 65%. The left ventricle has normal function. The left ventricle has no regional wall motion abnormalities. There is moderate left ventricular hypertrophy. Left ventricular diastolic  parameters are consistent with Grade II diastolic dysfunction (pseudonormalization). Elevated left ventricular end-diastolic pressure.  2. Right ventricular systolic function is normal. The right ventricular size is normal.  3. The aortic valve is tricuspid. Aortic valve regurgitation is not visualized. Mild to moderate aortic valve sclerosis/calcification is present, without any evidence of aortic stenosis.  4. Aortic dilatation noted. There is mild dilatation of the ascending aorta, measuring 38 mm.  5. The inferior vena cava is normal in size with greater than 50% respiratory variability, suggesting right atrial pressure of 3 mmHg.  Abdominal Aortic Duplex 03/13/2022: Moderate dilatation of the abdominal aorta is noted in the proximal and mid and distal aorta. There is dumb bell shaped abdominal aortic aneurysm from the proximal to distal abdominal aorta, largest measuring 3.2 x 3.1 x 3.1 cm. Moderate plaque noted in the proximal, mid and distal aorta.  Normal bilateral CIA size and velocity.  Compared to the study done on 09/12/2021, no significant change noted, the widest dimension of the AAA was measured at 3.9 cm. Recheck in 1 year for follow up.   EKG:   EKG 03/20/2022: Normal sinus rhythm at rate of 92 bpm, left middle enlargement, left  axis deviation, left anterior fascicular block.  Incomplete right bundle branch block.  Poor R progression, cannot exclude anterolateral infarct old.  No evidence of ischemia, normal QT interval.  Single PVC.  No significant change from 08/02/2021.    EKG 05/08/2020: Atrial fibrillation with rapid ventricular sponsor at rate of 128 bpm, normal axis, incomplete right bundle branch block.  No evidence of ischemia.  Assessment     ICD-10-CM   1. Primary hypertension  I10 EKG 12-Lead    2. 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 Hypercholesteremia  E78.00     4. Aortic atherosclerosis (HCC)  I70.0 aspirin (ASPIRIN CHILDRENS) 81 MG chewable tablet       There are no discontinued medications.   Meds ordered this encounter  Medications   aspirin (ASPIRIN CHILDRENS) 81 MG chewable tablet    Sig: Chew 1 tablet (81 mg  total) by mouth daily.   Orders Placed This Encounter  Procedures   EKG 12-Lead   Recommendations:   Timothy Case is a 66 y.o.  Caucasian male patient with history of TIA in 2014, history of ?alcohol excess use, hypertension, hyperlipidemia, last hospitalization on 05/04/2020 for subdural hematoma after a fall with altered mental status, also had subarachnoid hemorrhage and bifrontal hemorrhage and right occipital bone fracture.  He also has history of asymptomatic renal stones.  This is a 48-monthoffice visit.  1. Primary hypertension Blood pressure is well-controlled, he has lost about 29 to 30 pounds in weight since being on Ozempic.  He is on Altace, continue the same.  2. [place] abdominal aortic aneurysm (AAA) without rupture ([place] AAA remains very stable, will repeat aortic duplex in 1 year.  He has significant amount of aortic atherosclerotic plaque, hence he will be started back on aspirin 81 mg daily, his prior subdural hematoma and subarachnoid hemorrhage was related to mechanical fall and it has  been >1-year ago.  Hence it is safe to restart aspirin 81 mg daily.  3. Hypercholesteremia Reviewed his lipids, excellent control of lipids.  Positive reinforcement given to the patient, he has been exercising regularly, remains asymptomatic.  4. Aortic atherosclerosis ([facility] As dictated above, significant amount of plaque noted in the abdominal attic duplex.   JAdrian Prows MD, [facility]12/10/2021, 9:40 PM Office: [phone]

## 2023-01-06 ENCOUNTER — Telehealth: Payer: Self-pay | Admitting: Cardiology

## 2023-01-06 NOTE — Telephone Encounter (Signed)
Received a message via patient schedule to cancel the upcoming appointment with Dr. Jacinto Halim due to moving to [place]. He is requesting a referral be sent to a [place] cardiologist of Dr. Verl Dicker choice. Please advise.

## 2023-01-08 NOTE — Telephone Encounter (Signed)
He is very stable from CV standpoint and he can establish with anyone there and they use Epic so will be able to see all my notes and evaluations. Make PRN with me

## 2023-01-09 NOTE — Telephone Encounter (Signed)
Sent message back to patient letting him know.

## 2023-03-11 ENCOUNTER — Other Ambulatory Visit: Payer: PRIVATE HEALTH INSURANCE

## 2023-03-20 ENCOUNTER — Ambulatory Visit: Payer: Self-pay | Admitting: Cardiology
